# Patient Record
Sex: Male | Born: 1965
Health system: Southern US, Community
[De-identification: ages and names within clinical notes are randomized; demographics above are authoritative.]

## PROBLEM LIST (undated history)

## (undated) DIAGNOSIS — M779 Enthesopathy, unspecified: Secondary | ICD-10-CM

## (undated) DIAGNOSIS — E119 Type 2 diabetes mellitus without complications: Secondary | ICD-10-CM

## (undated) DIAGNOSIS — R609 Edema, unspecified: Secondary | ICD-10-CM

## (undated) DIAGNOSIS — M719 Bursopathy, unspecified: Secondary | ICD-10-CM

## (undated) DIAGNOSIS — F4310 Post-traumatic stress disorder, unspecified: Secondary | ICD-10-CM

## (undated) DIAGNOSIS — F32A Depression, unspecified: Secondary | ICD-10-CM

## (undated) DIAGNOSIS — L0390 Cellulitis, unspecified: Secondary | ICD-10-CM

## (undated) DIAGNOSIS — K589 Irritable bowel syndrome without diarrhea: Secondary | ICD-10-CM

## (undated) DIAGNOSIS — E7800 Pure hypercholesterolemia, unspecified: Secondary | ICD-10-CM

## (undated) DIAGNOSIS — F329 Major depressive disorder, single episode, unspecified: Secondary | ICD-10-CM

## (undated) DIAGNOSIS — G4730 Sleep apnea, unspecified: Secondary | ICD-10-CM

## (undated) DIAGNOSIS — F410 Panic disorder [episodic paroxysmal anxiety] without agoraphobia: Secondary | ICD-10-CM

## (undated) DIAGNOSIS — M109 Gout, unspecified: Secondary | ICD-10-CM

## (undated) DIAGNOSIS — I10 Essential (primary) hypertension: Secondary | ICD-10-CM

## (undated) DIAGNOSIS — R011 Cardiac murmur, unspecified: Secondary | ICD-10-CM

## (undated) HISTORY — DX: Major depressive disorder, single episode, unspecified: F32.9

## (undated) HISTORY — DX: Edema, unspecified: R60.9

## (undated) HISTORY — DX: Post-traumatic stress disorder, unspecified: F43.10

## (undated) HISTORY — DX: Irritable bowel syndrome, unspecified: K58.9

## (undated) HISTORY — DX: Panic disorder (episodic paroxysmal anxiety): F41.0

## (undated) HISTORY — DX: Sleep apnea, unspecified: G47.30

## (undated) HISTORY — DX: Enthesopathy, unspecified: M77.9

## (undated) HISTORY — PX: ABSCESS DRAINAGE: SHX1119

## (undated) HISTORY — DX: Depression, unspecified: F32.A

## (undated) HISTORY — DX: Cardiac murmur, unspecified: R01.1

---

## 2008-01-24 ENCOUNTER — Emergency Department (HOSPITAL_COMMUNITY): Admission: EM | Admit: 2008-01-24 | Discharge: 2008-01-24 | Payer: Self-pay | Admitting: Emergency Medicine

## 2008-02-22 ENCOUNTER — Emergency Department (HOSPITAL_COMMUNITY): Admission: EM | Admit: 2008-02-22 | Discharge: 2008-02-22 | Payer: Self-pay | Admitting: Emergency Medicine

## 2008-03-06 ENCOUNTER — Encounter: Admission: RE | Admit: 2008-03-06 | Discharge: 2008-03-06 | Payer: Self-pay | Admitting: Chiropractic Medicine

## 2008-08-08 ENCOUNTER — Ambulatory Visit (HOSPITAL_BASED_OUTPATIENT_CLINIC_OR_DEPARTMENT_OTHER): Admission: RE | Admit: 2008-08-08 | Discharge: 2008-08-08 | Payer: Self-pay | Admitting: Internal Medicine

## 2008-08-16 ENCOUNTER — Ambulatory Visit: Payer: Self-pay | Admitting: Internal Medicine

## 2010-06-01 NOTE — Procedures (Signed)
NAMEELFORD, EVILSIZER            ACCOUNT NO.:  [id number]   MEDICAL RECORD NO.:  [id number]          PATIENT TYPE:  OUT   LOCATION:  SLEEP CENTER                 FACILITY:  [facility]   PHYSICIAN:  Clinton D. Maple Hudson, MD, FCCP, FACPDATE OF BIRTH:  March 02, 1966   DATE OF STUDY:  08/08/2008                            NOCTURNAL POLYSOMNOGRAM   REFERRING PHYSICIAN:  Fleet Contras, M.D.   INDICATION FOR STUDY:  Hypersomnia with sleep apnea.   EPWORTH SLEEPINESS SCORE:  Epworth sleepiness score 10/24, BMI 53.9.  Weight 365 pounds, height 69 inches.  Neck 19.5 inches.   MEDICATIONS:  Home medications charted and reviewed.   SLEEP ARCHITECTURE:  Split study protocol.  During the diagnostic phase,  total sleep time 111.5 minutes with sleep efficiency 53%.  Stage I was  17.5%, stage II 82.5%, stages III and REM were absent.  Sleep latency  24.5 minutes.  Awake after sleep onset 74.5 minutes.  Arousal index  115.7 indicating increased EEG arousal.  No bedtime medication was  taken.   RESPIRATORY DATA:  Split study protocol.  Apnea/hypopnea index (AHI)  120.5 per hour.  A total of 224 events were scored including 212  obstructive apneas and 12 hypopneas.  Events were nonpositional.  CPAP  was then titrated to 19 CWP, AHI 0 per hour.  He wore a large ResMed  Quattro full face mask with heated humidifier.   OXYGEN DATA:  Loud snoring before CPAP with oxygen desaturation to a  nadir of 49%.  After CPAP control, mean oxygen saturation held at 94.1%  on room air.   CARDIAC DATA:  Normal sinus rhythm.   MOVEMENT-PARASOMNIA:  During the titration phase, a total of 24 limb  jerks were counted but none of these was associated with arousal or  awakening.  Bathroom x2.   IMPRESSIONS-RECOMMENDATIONS:  1. Severe obstructive sleep apnea/hypopnea syndrome, AHI 120.5 per      hour with nonpositional events.  Loud snoring with oxygen      desaturation to a nadir of 49% on room air.  2. Successful CPAP  titration to 19 CWP, AHI 0 per hour.  He wore a      large ResMed Quattro full face mask with heated humidifier.  Mean      oxygen saturation held at 94.1% with CPAP on room air.      Clinton D. Maple Hudson, MD, [facility], FACP  Diplomate, Biomedical engineer of Sleep Medicine  Electronically Signed     CDY/MEDQ  D:  08/16/2008 09:57:22  T:  08/16/2008 12:48:09  Job:  [id number]

## 2012-01-31 ENCOUNTER — Emergency Department (HOSPITAL_COMMUNITY): Payer: Self-pay

## 2012-01-31 ENCOUNTER — Emergency Department (HOSPITAL_COMMUNITY)
Admission: EM | Admit: 2012-01-31 | Discharge: 2012-01-31 | Disposition: A | Discharge status: Home / Self Care | Payer: Self-pay | Attending: Emergency Medicine | Admitting: Emergency Medicine

## 2012-01-31 ENCOUNTER — Encounter (HOSPITAL_COMMUNITY): Payer: Self-pay | Admitting: *Deleted

## 2012-01-31 DIAGNOSIS — I10 Essential (primary) hypertension: Secondary | ICD-10-CM | POA: Insufficient documentation

## 2012-01-31 DIAGNOSIS — Y939 Activity, unspecified: Secondary | ICD-10-CM | POA: Insufficient documentation

## 2012-01-31 DIAGNOSIS — Z8739 Personal history of other diseases of the musculoskeletal system and connective tissue: Secondary | ICD-10-CM | POA: Insufficient documentation

## 2012-01-31 DIAGNOSIS — S8000XA Contusion of unspecified knee, initial encounter: Secondary | ICD-10-CM | POA: Insufficient documentation

## 2012-01-31 DIAGNOSIS — E119 Type 2 diabetes mellitus without complications: Secondary | ICD-10-CM | POA: Insufficient documentation

## 2012-01-31 DIAGNOSIS — Y929 Unspecified place or not applicable: Secondary | ICD-10-CM | POA: Insufficient documentation

## 2012-01-31 DIAGNOSIS — Z862 Personal history of diseases of the blood and blood-forming organs and certain disorders involving the immune mechanism: Secondary | ICD-10-CM | POA: Insufficient documentation

## 2012-01-31 DIAGNOSIS — E7800 Pure hypercholesterolemia, unspecified: Secondary | ICD-10-CM | POA: Insufficient documentation

## 2012-01-31 DIAGNOSIS — R630 Anorexia: Secondary | ICD-10-CM | POA: Insufficient documentation

## 2012-01-31 DIAGNOSIS — X58XXXA Exposure to other specified factors, initial encounter: Secondary | ICD-10-CM | POA: Insufficient documentation

## 2012-01-31 DIAGNOSIS — Z8639 Personal history of other endocrine, nutritional and metabolic disease: Secondary | ICD-10-CM | POA: Insufficient documentation

## 2012-01-31 HISTORY — DX: Type 2 diabetes mellitus without complications: E11.9

## 2012-01-31 HISTORY — DX: Essential (primary) hypertension: I10

## 2012-01-31 HISTORY — DX: Gout, unspecified: M10.9

## 2012-01-31 HISTORY — DX: Pure hypercholesterolemia, unspecified: E78.00

## 2012-01-31 HISTORY — DX: Bursopathy, unspecified: M71.9

## 2012-01-31 MED ORDER — ONDANSETRON HCL 4 MG PO TABS
4.0000 mg | ORAL_TABLET | Freq: Once | ORAL | Status: AC
  Administered 2012-01-31: 4 mg via ORAL
  Filled 2012-01-31: qty 1

## 2012-01-31 MED ORDER — HYDROCODONE-ACETAMINOPHEN 5-325 MG PO TABS: 1.0000 | ORAL_TABLET | ORAL | Status: DC | PRN

## 2012-01-31 MED ORDER — MORPHINE SULFATE 4 MG/ML IJ SOLN
8.0000 mg | Freq: Once | INTRAMUSCULAR | Status: AC
  Administered 2012-01-31: 8 mg via INTRAMUSCULAR
  Filled 2012-01-31: qty 2

## 2012-01-31 NOTE — ED Notes (Signed)
Pain lt knee for 1 week, hx of gout,  Slid foot into sofa and thinks he hurt his knee then.

## 2012-01-31 NOTE — ED Provider Notes (Signed)
Medical screening examination/treatment/procedure(s) were performed by non-physician practitioner and as supervising physician I was immediately available for consultation/collaboration.   Tahliyah Anagnos L Mcadoo Muzquiz, MD 01/31/12 2046 

## 2012-01-31 NOTE — ED Provider Notes (Signed)
History     CSN: [id number]  Arrival date & time 01/31/12  1715   First MD Initiated Contact with Patient 01/31/12 1919      Chief Complaint  Patient presents with  . Knee Pain    (Consider location/radiation/quality/duration/timing/severity/associated sxs/prior treatment) Patient is a 47 y.o. male presenting with knee pain. The history is provided by the patient.  Knee Pain This is a new problem. The current episode started in the past 7 days. The problem occurs constantly. The problem has been gradually worsening. Associated symptoms include anorexia, arthralgias and joint swelling. Pertinent negatives include no abdominal pain, chest pain, coughing or neck pain. The symptoms are aggravated by standing and walking. He has tried acetaminophen and NSAIDs for the symptoms. The treatment provided no relief.    Past Medical History  Diagnosis Date  . Diabetes mellitus without complication   . Hypertension   . Hypercholesteremia   . Gout   . Bursitis     Past Surgical History  Procedure Date  . Abscess drainage     History reviewed. No pertinent family history.  History  Substance Use Topics  . Smoking status: Never Smoker   . Smokeless tobacco: Not on file  . Alcohol Use: No      Review of Systems  Constitutional: Negative for activity change.       All ROS Neg except as noted in HPI  HENT: Negative for nosebleeds and neck pain.   Eyes: Negative for photophobia and discharge.  Respiratory: Negative for cough, shortness of breath and wheezing.   Cardiovascular: Negative for chest pain and palpitations.  Gastrointestinal: Positive for anorexia. Negative for abdominal pain and blood in stool.  Genitourinary: Negative for dysuria, frequency and hematuria.  Musculoskeletal: Positive for joint swelling and arthralgias. Negative for back pain.  Skin: Negative.   Neurological: Negative for dizziness, seizures and speech difficulty.  Psychiatric/Behavioral: Negative for  hallucinations and confusion.    Allergies  Penicillins  Home Medications  No current outpatient prescriptions on file.  BP 129/64  Pulse 81  Temp 97.7 F (36.5 C) (Oral)  Resp 18  SpO2 97%  Physical Exam  Nursing note and vitals reviewed. Constitutional: He is oriented to person, place, and time. He appears well-developed and well-nourished.  Non-toxic appearance.  HENT:  Head: Normocephalic.  Right Ear: Tympanic membrane and external ear normal.  Left Ear: Tympanic membrane and external ear normal.  Eyes: EOM and lids are normal. Pupils are equal, round, and reactive to light.  Neck: Normal range of motion. Neck supple. Carotid bruit is not present.  Cardiovascular: Normal rate, regular rhythm, normal heart sounds, intact distal pulses and normal pulses.   Pulmonary/Chest: Breath sounds normal. No respiratory distress.  Abdominal: Soft. Bowel sounds are normal. There is no tenderness. There is no guarding.  Musculoskeletal: Normal range of motion.       There is full range of motion of the left hip. There is no deformity of the left quadricep area. There is mild crepitus present. The patella is in the midline. There is pain with extension and flexion of the left knee. There is no posterior mass appreciated. Distal pulses are symmetrical. Sensory is intact. Achilles tendon is intact.  Lymphadenopathy:       Head (right side): No submandibular adenopathy present.       Head (left side): No submandibular adenopathy present.    He has no cervical adenopathy.  Neurological: He is alert and oriented to person, place, and time. He  has normal strength. No cranial nerve deficit or sensory deficit.  Skin: Skin is warm and dry.  Psychiatric: He has a normal mood and affect. His speech is normal.    ED Course  Procedures (including critical care time)  Labs Reviewed - No data to display Dg Knee Complete 4 Views Left  01/31/2012  *RADIOLOGY REPORT*  Clinical Data: Pain post blunt  trauma.  LEFT KNEE - COMPLETE 4+ VIEW  Comparison: 03/06/2008  Findings: No effusion. Negative for fracture, dislocation, or other acute abnormality.  Normal alignment and mineralization. No significant degenerative change.  Regional soft tissues unremarkable.  IMPRESSION:  Negative   Original Report Authenticated By: D. Andria Rhein, MD      No diagnosis found.    MDM  I have reviewed nursing notes, vital signs, and all appropriate lab and imaging results for this patient. X-ray of the left knee is negative for fracture or dislocation. There is mild crepitus present with flexion and extension. There is mild effusion noted. The plan is for the patient to be seen by orthopedics. Prescription for Norco one every 4 hours as needed for pain given to the patient. Patient has a history of diabetes mellitus as well as gout, and has been advised to see his physicians at the health department for additional testing and evaluation of these issues.       Kathie Dike, [place] 01/31/12 1940

## 2014-12-25 ENCOUNTER — Encounter (HOSPITAL_COMMUNITY): Payer: Self-pay | Admitting: *Deleted

## 2014-12-25 ENCOUNTER — Emergency Department (HOSPITAL_COMMUNITY): Payer: Self-pay

## 2014-12-25 ENCOUNTER — Emergency Department (HOSPITAL_COMMUNITY)
Admission: EM | Admit: 2014-12-25 | Discharge: 2014-12-25 | Disposition: A | Discharge status: Home / Self Care | Payer: Self-pay | Attending: Emergency Medicine | Admitting: Emergency Medicine

## 2014-12-25 DIAGNOSIS — Z8781 Personal history of (healed) traumatic fracture: Secondary | ICD-10-CM | POA: Insufficient documentation

## 2014-12-25 DIAGNOSIS — I10 Essential (primary) hypertension: Secondary | ICD-10-CM

## 2014-12-25 DIAGNOSIS — Z880 Allergy status to penicillin: Secondary | ICD-10-CM | POA: Insufficient documentation

## 2014-12-25 DIAGNOSIS — E119 Type 2 diabetes mellitus without complications: Secondary | ICD-10-CM

## 2014-12-25 DIAGNOSIS — Z7984 Long term (current) use of oral hypoglycemic drugs: Secondary | ICD-10-CM | POA: Insufficient documentation

## 2014-12-25 DIAGNOSIS — Z79899 Other long term (current) drug therapy: Secondary | ICD-10-CM | POA: Insufficient documentation

## 2014-12-25 DIAGNOSIS — Z792 Long term (current) use of antibiotics: Secondary | ICD-10-CM | POA: Insufficient documentation

## 2014-12-25 DIAGNOSIS — L03116 Cellulitis of left lower limb: Secondary | ICD-10-CM | POA: Insufficient documentation

## 2014-12-25 DIAGNOSIS — L0390 Cellulitis, unspecified: Secondary | ICD-10-CM

## 2014-12-25 DIAGNOSIS — M109 Gout, unspecified: Secondary | ICD-10-CM | POA: Insufficient documentation

## 2014-12-25 LAB — COMPREHENSIVE METABOLIC PANEL
ALT: 28 U/L (ref 17–63)
AST: 29 U/L (ref 15–41)
Albumin: 4 g/dL (ref 3.5–5.0)
Alkaline Phosphatase: 60 U/L (ref 38–126)
Anion gap: 10 (ref 5–15)
BILIRUBIN TOTAL: 1.8 mg/dL — AB (ref 0.3–1.2)
BUN: 11 mg/dL (ref 6–20)
CALCIUM: 8.9 mg/dL (ref 8.9–10.3)
CO2: 26 mmol/L (ref 22–32)
CREATININE: 0.74 mg/dL (ref 0.61–1.24)
Chloride: 98 mmol/L — ABNORMAL LOW (ref 101–111)
GFR calc Af Amer: >60 mL/min (ref >60)
GLUCOSE: 207 mg/dL — AB (ref 65–99)
POTASSIUM: 4.2 mmol/L (ref 3.5–5.1)
Sodium: 134 mmol/L — ABNORMAL LOW (ref 135–145)
TOTAL PROTEIN: 7.9 g/dL (ref 6.5–8.1)

## 2014-12-25 LAB — CBC WITH DIFFERENTIAL/PLATELET
Basophils Absolute: 0 10*3/uL (ref 0.0–0.1)
Basophils Relative: 0 %
Eosinophils Absolute: 0.2 10*3/uL (ref 0.0–0.7)
Eosinophils Relative: 1 %
HEMATOCRIT: 42.7 % (ref 39.0–52.0)
Hemoglobin: 14.9 g/dL (ref 13.0–17.0)
LYMPHS PCT: 14 %
Lymphs Abs: 1.5 10*3/uL (ref 0.7–4.0)
MCH: 31 pg (ref 26.0–34.0)
MCHC: 34.9 g/dL (ref 30.0–36.0)
MCV: 89 fL (ref 78.0–100.0)
MONO ABS: 0.8 10*3/uL (ref 0.1–1.0)
MONOS PCT: 8 %
NEUTROS ABS: 8.2 10*3/uL — AB (ref 1.7–7.7)
Neutrophils Relative %: 77 %
Platelets: 214 10*3/uL (ref 150–400)
RBC: 4.8 MIL/uL (ref 4.22–5.81)
RDW: 12.9 % (ref 11.5–15.5)
WBC: 10.8 10*3/uL — ABNORMAL HIGH (ref 4.0–10.5)

## 2014-12-25 MED ORDER — ACIDOPHILUS PROBIOTIC PO TABS: 1.0000 | ORAL_TABLET | Freq: Two times a day (BID) | ORAL | Status: DC

## 2014-12-25 MED ORDER — CLINDAMYCIN HCL 150 MG PO CAPS: 300.0000 mg | ORAL_CAPSULE | Freq: Three times a day (TID) | ORAL | Status: AC

## 2014-12-25 MED ORDER — VANCOMYCIN HCL IN DEXTROSE 1-5 GM/200ML-% IV SOLN
1000.0000 mg | Freq: Once | INTRAVENOUS | Status: AC
  Administered 2014-12-25: 1000 mg via INTRAVENOUS
  Filled 2014-12-25: qty 200

## 2014-12-25 NOTE — Consult Note (Addendum)
Patient Demographics  Nathan Velez, is a 49 y.o. male   MRN: [id number]   DOB - August 18, 1965  Admit Date - 12/25/2014    Outpatient Primary MD for the patient is No primary care provider on file.  Consult requested in the Hospital by Bethann Berkshire, MD, On 12/25/2014    Reason for consult : Celluliits   With History of -  Past Medical History  Diagnosis Date  . Diabetes mellitus without complication (HCC)   . Hypertension   . Hypercholesteremia   . Gout   . Bursitis       Past Surgical History  Procedure Laterality Date  . Abscess drainage      in for   Chief Complaint  Patient presents with  . Cellulitis     HPI  Nathan Velez  is a 49 y.o. male, with PMH of DM, obesity, gout and hypertension, presents with left lower extremity cellulitis, patient reports started to have erythema, tenderness and swelling over last 24 hours, denies any injury, insect bite, went to health department, told by the triage nurse to come to ED, NAD patient had venous Doppler of left lower extremity with no evidence of DVT, afebrile, denies any fever or chills, no leukocytosis, no discharge, but this requested to admit for possible need for admission IV antibiotics,  .   Review of Systems    In addition to the HPI above, No Fever-chills, No Headache, No changes with Vision or hearing, No problems swallowing food or Liquids, No Chest pain, Cough or Shortness of Breath, No Abdominal pain, No Nausea or Vommitting, Bowel movements are regular, No Blood in stool or Urine, No dysuria, Lower ext erythema, swelling, over last 24 hours, No new joints pains-aches,  No new weakness, tingling, numbness in any extremity, No recent weight gain or loss, No polyuria, polydypsia or polyphagia, No significant Mental Stressors.  A full 10 point Review of Systems was done, except as stated above, all other  Review of Systems were negative.   Social History Social History  Substance Use Topics  . Smoking status: Never Smoker   . Smokeless tobacco: Not on file  . Alcohol Use: No     Family History Significant for  Prior to Admission medications   Medication Sig Start Date End Date Taking? Authorizing Provider  allopurinol (ZYLOPRIM) 300 MG tablet Take 300 mg by mouth daily.   Yes Historical Provider, MD  furosemide (LASIX) 40 MG tablet Take 40 mg by mouth daily as needed for fluid or edema.   Yes Historical Provider, MD  glyBURIDE (DIABETA) 5 MG tablet Take 10 mg by mouth 2 (two) times daily with a meal.   Yes Historical Provider, MD  lisinopril (PRINIVIL,ZESTRIL) 5 MG tablet Take 5 mg by mouth daily.   Yes Historical Provider, MD  metFORMIN (GLUCOPHAGE) 1000 MG tablet Take 1,000 mg by mouth 2 (two) times daily with a meal.   Yes Historical Provider, MD  metoprolol tartrate (LOPRESSOR) 25 MG tablet Take 25 mg by  mouth daily as needed (palpatations).   Yes Historical Provider, MD  potassium chloride SA (K-DUR,KLOR-CON) 20 MEQ tablet Take 20 mEq by mouth daily as needed (only take with furosemide).    Yes Historical Provider, MD  HYDROcodone-acetaminophen (NORCO/VICODIN) 5-325 MG per tablet Take 1 tablet by mouth every 4 (four) hours as needed for pain. Patient not taking: Reported on 12/25/2014 01/31/12   Ivery QualeHobson Bryant, PA-C    Anti-infectives    Start     Dose/Rate Route Frequency Ordered Stop   12/25/14 1545  vancomycin (VANCOCIN) IVPB 1000 mg/200 mL premix     1,000 mg 200 mL/hr over 60 Minutes Intravenous  Once 12/25/14 1541 12/25/14 1717      Scheduled Meds: Continuous Infusions: PRN Meds:.  Allergies  Allergen Reactions  . Penicillins Other (See Comments)    Physical Exam  Vitals  Blood pressure 114/76, pulse 77, temperature 97.6 F (36.4 C), temperature source Oral, resp. rate 18, height 5\' 9"  (1.753 m), weight 184.16 kg (406 lb), SpO2 100 %.   1. General morbidly  obese male lying in bed in NAD,   2. Normal affect and insight, Not Suicidal or Homicidal, Awake Alert, Oriented X 3.  3. No F.N deficits, ALL C.Nerves Intact, Strength 5/5 all 4 extremities, Sensation intact all 4 extremities, Plantars down going.  4. Ears and Eyes appear Normal, Conjunctivae clear, PERRLA. Moist Oral Mucosa.  5. Supple Neck, No JVD, No cervical lymphadenopathy appriciated, No Carotid Bruits.  6. Symmetrical Chest wall movement, Good air movement bilaterally, CTAB.  7. RRR, No Gallops, Rubs or Murmurs, No Parasternal Heave.  8. Positive Bowel Sounds, Abdomen Soft, No tenderness, No organomegaly appriciated,No rebound -guarding or rigidity.  9.  No Cyanosis, Normal Skin Turgor, left lower extremity cellulitis, no discharge or bullae.  10. Good muscle tone,  joints appear normal , no effusions, Normal ROM.  11. No Palpable Lymph Nodes in Neck or Axillae    Data Review  CBC  Recent Labs Lab 12/25/14 1539  WBC 10.8*  HGB 14.9  HCT 42.7  PLT 214  MCV 89.0  MCH 31.0  MCHC 34.9  RDW 12.9  LYMPHSABS 1.5  MONOABS 0.8  EOSABS 0.2  BASOSABS 0.0   ------------------------------------------------------------------------------------------------------------------  Chemistries   Recent Labs Lab 12/25/14 1539  NA 134*  K 4.2  CL 98*  CO2 26  GLUCOSE 207*  BUN 11  CREATININE 0.74  CALCIUM 8.9  AST 29  ALT 28  ALKPHOS 60  BILITOT 1.8*   ------------------------------------------------------------------------------------------------------------------ estimated creatinine clearance is 185.4 mL/min (by C-G formula based on Cr of 0.74). ------------------------------------------------------------------------------------------------------------------ No results for input(s): TSH, T4TOTAL, T3FREE, THYROIDAB in the last 72 hours.  Invalid input(s): FREET3   Coagulation profile No results for input(s): INR, PROTIME in the last 168  hours. ------------------------------------------------------------------------------------------------------------------- No results for input(s): DDIMER in the last 72 hours. -------------------------------------------------------------------------------------------------------------------  Cardiac Enzymes No results for input(s): CKMB, TROPONINI, MYOGLOBIN in the last 168 hours.  Invalid input(s): CK ------------------------------------------------------------------------------------------------------------------ Invalid input(s): POCBNP   ---------------------------------------------------------------------------------------------------------------  Urinalysis No results found for: COLORURINE, APPEARANCEUR, LABSPEC, PHURINE, GLUCOSEU, HGBUR, BILIRUBINUR, KETONESUR, PROTEINUR, UROBILINOGEN, NITRITE, LEUKOCYTESUR   Imaging results:   [place] Venous Img Lower Unilateral Left  12/25/2014  CLINICAL DATA:  Left lower extremity pain and swelling. EXAM: Left LOWER EXTREMITY VENOUS DOPPLER ULTRASOUND TECHNIQUE: Gray-scale sonography with graded compression, as well as color Doppler and duplex ultrasound were performed to evaluate the lower extremity deep venous systems from the level of the common femoral vein and including the  common femoral, femoral, profunda femoral, popliteal and calf veins including the posterior tibial, peroneal and gastrocnemius veins when visible. The superficial great saphenous vein was also interrogated. Spectral Doppler was utilized to evaluate flow at rest and with distal augmentation maneuvers in the common femoral, femoral and popliteal veins. COMPARISON:  None. FINDINGS: Contralateral Common Femoral Vein: Respiratory phasicity is normal and symmetric with the symptomatic side. No evidence of thrombus. Normal compressibility. Common Femoral Vein: No evidence of thrombus. Normal compressibility, respiratory phasicity and response to augmentation. Saphenofemoral Junction: No  evidence of thrombus. Normal compressibility and flow on color Doppler imaging. Profunda Femoral Vein: No evidence of thrombus. Normal compressibility and flow on color Doppler imaging. Femoral Vein: No evidence of thrombus. Normal compressibility, respiratory phasicity and response to augmentation. Popliteal Vein: No evidence of thrombus. Normal compressibility, respiratory phasicity and response to augmentation. Calf Veins: No evidence of thrombus. Normal compressibility and flow on color Doppler imaging. Superficial Great Saphenous Vein: No evidence of thrombus. Normal compressibility and flow on color Doppler imaging. Venous Reflux:  None. Other Findings:  None. IMPRESSION: No evidence of deep venous thrombosis seen in left lower extremity. Electronically Signed   By: Lupita Raider, M.D.   On: 12/25/2014 16:56        Assessment & Plan  Active Problems:   Cellulitis   HTN (hypertension)   DM type 2 (diabetes mellitus, type 2) (HCC)    Cellulitis - Patient with left lower extremity cellulitis, no evidence of sepsis, no tachycardia, no tachypnea, no fever, no leukocytosis, no discharge, did not fail po antibiotics, patient wishes to go home on oral antibiotic which I think it would be appropriate, can be discharged on clindamycin 300 mg oral every 6 hour total of 7 days, his clindamcyin was called to [facility],he reports he can afford it , and will pick it up on his way home,  Patient was instructed to come back to ED in 1-2 days if no improvement of cellulitis( edges were marked), or if he developed fever or chills,as well instructed to take oral probiotics, and to keep leg elevated.  Diabetes mellitus - Continue with home medication  Hypertension - Continue with the medication     Family Communication: Plan discussed with patient and mother    [facility], [place] M.D on 12/25/2014 at 7:04 PM  Between 7am to 7pm - Pager - [phone]  After 7pm go to [email or web address] -  password [email or web address]   Thank you for the consult, we will follow the patient with you in the Hospital.   [facility]   Office  [phone]

## 2014-12-25 NOTE — ED Notes (Signed)
Swelling and redness to left lower leg first noticed yesterday. Was told by triage nurse at health dept to come to the ED. Pt states fever and chills earlier this morning.

## 2014-12-25 NOTE — ED Provider Notes (Signed)
CSN: [id number]     Arrival date & time 12/25/14  1519 History   First MD Initiated Contact with Patient 12/25/14 1529     Chief Complaint  Patient presents with  . Cellulitis     (Consider location/radiation/quality/duration/timing/severity/associated sxs/prior Treatment) Patient is a 49 y.o. male presenting with rash. The history is provided by the patient (The patient complains of a rash to his left lower leg.. Which started yesterday).  Rash Location: Left lower leg. Quality: not blistering   Severity:  Moderate Onset quality:  Sudden Timing:  Constant Progression:  Worsening Chronicity:  New Context: not animal contact   Associated symptoms: no abdominal pain, no diarrhea, no fatigue and no headaches     Past Medical History  Diagnosis Date  . Diabetes mellitus without complication (HCC)   . Hypertension   . Hypercholesteremia   . Gout   . Bursitis    Past Surgical History  Procedure Laterality Date  . Abscess drainage     No family history on file. Social History  Substance Use Topics  . Smoking status: Never Smoker   . Smokeless tobacco: None  . Alcohol Use: No    Review of Systems  Constitutional: Negative for appetite change and fatigue.  HENT: Negative for congestion, ear discharge and sinus pressure.   Eyes: Negative for discharge.  Respiratory: Negative for cough.   Cardiovascular: Negative for chest pain.  Gastrointestinal: Negative for abdominal pain and diarrhea.  Genitourinary: Negative for frequency and hematuria.  Musculoskeletal: Negative for back pain.  Skin: Positive for rash.  Neurological: Negative for seizures and headaches.  Psychiatric/Behavioral: Negative for hallucinations.      Allergies  Penicillins  Home Medications   Prior to Admission medications   Medication Sig Start Date End Date Taking? Authorizing Provider  allopurinol (ZYLOPRIM) 300 MG tablet Take 300 mg by mouth daily.   Yes Historical Provider, MD  furosemide  (LASIX) 40 MG tablet Take 40 mg by mouth daily as needed for fluid or edema.   Yes Historical Provider, MD  glyBURIDE (DIABETA) 5 MG tablet Take 10 mg by mouth 2 (two) times daily with a meal.   Yes Historical Provider, MD  lisinopril (PRINIVIL,ZESTRIL) 5 MG tablet Take 5 mg by mouth daily.   Yes Historical Provider, MD  metFORMIN (GLUCOPHAGE) 1000 MG tablet Take 1,000 mg by mouth 2 (two) times daily with a meal.   Yes Historical Provider, MD  metoprolol tartrate (LOPRESSOR) 25 MG tablet Take 25 mg by mouth daily as needed (palpatations).   Yes Historical Provider, MD  potassium chloride SA (K-DUR,KLOR-CON) 20 MEQ tablet Take 20 mEq by mouth daily as needed (only take with furosemide).    Yes Historical Provider, MD  clindamycin (CLEOCIN) 150 MG capsule Take 2 capsules (300 mg total) by mouth 3 (three) times daily. 12/25/14 12/31/14  Leana Roe Elgergawy, MD  HYDROcodone-acetaminophen (NORCO/VICODIN) 5-325 MG per tablet Take 1 tablet by mouth every 4 (four) hours as needed for pain. Patient not taking: Reported on 12/25/2014 01/31/12   Ivery Quale, PA-C  Lactobacillus (ACIDOPHILUS PROBIOTIC) TABS Take 1 tablet by mouth 2 (two) times daily. 12/25/14   Leana Roe Elgergawy, MD   BP 114/76 mmHg  Pulse 77  Temp(Src) 97.6 F (36.4 C) (Oral)  Resp 18  Ht  (1.753 m)  Wt 406 lb (184.16 kg)  BMI 59.93 kg/m2  SpO2 100% Physical Exam  Constitutional: He is oriented to person, place, and time. He appears well-developed.  HENT:  Head: Normocephalic.  Eyes: Conjunctivae and EOM are normal. No scleral icterus.  Neck: Neck supple. No thyromegaly present.  Cardiovascular: Normal rate and regular rhythm.  Exam reveals no gallop and no friction rub.   No murmur heard. Pulmonary/Chest: No stridor. He has no wheezes. He has no rales. He exhibits no tenderness.  Abdominal: He exhibits no distension. There is no tenderness. There is no rebound.  Musculoskeletal: Normal range of motion. He exhibits no edema.   Patient has a rash to his foot and lower leg up to his knee on the left side. Consistent with cellulitis  Lymphadenopathy:    He has no cervical adenopathy.  Neurological: He is oriented to person, place, and time. He exhibits normal muscle tone. Coordination normal.  Skin: Rash noted. No erythema.  Psychiatric: He has a normal mood and affect. His behavior is normal.    ED Course  Procedures (including critical care time) Labs Review Labs Reviewed  CBC WITH DIFFERENTIAL/PLATELET - Abnormal; Notable for the following:    WBC 10.8 (*)    Neutro Abs 8.2 (*)    All other components within normal limits  COMPREHENSIVE METABOLIC PANEL - Abnormal; Notable for the following:    Sodium 134 (*)    Chloride 98 (*)    Glucose, Bld 207 (*)    Total Bilirubin 1.8 (*)    All other components within normal limits    Imaging Review [place] Venous Img Lower Unilateral Left  12/25/2014  CLINICAL DATA:  Left lower extremity pain and swelling. EXAM: Left LOWER EXTREMITY VENOUS DOPPLER ULTRASOUND TECHNIQUE: Gray-scale sonography with graded compression, as well as color Doppler and duplex ultrasound were performed to evaluate the lower extremity deep venous systems from the level of the common femoral vein and including the common femoral, femoral, profunda femoral, popliteal and calf veins including the posterior tibial, peroneal and gastrocnemius veins when visible. The superficial great saphenous vein was also interrogated. Spectral Doppler was utilized to evaluate flow at rest and with distal augmentation maneuvers in the common femoral, femoral and popliteal veins. COMPARISON:  None. FINDINGS: Contralateral Common Femoral Vein: Respiratory phasicity is normal and symmetric with the symptomatic side. No evidence of thrombus. Normal compressibility. Common Femoral Vein: No evidence of thrombus. Normal compressibility, respiratory phasicity and response to augmentation. Saphenofemoral Junction: No evidence of  thrombus. Normal compressibility and flow on color Doppler imaging. Profunda Femoral Vein: No evidence of thrombus. Normal compressibility and flow on color Doppler imaging. Femoral Vein: No evidence of thrombus. Normal compressibility, respiratory phasicity and response to augmentation. Popliteal Vein: No evidence of thrombus. Normal compressibility, respiratory phasicity and response to augmentation. Calf Veins: No evidence of thrombus. Normal compressibility and flow on color Doppler imaging. Superficial Great Saphenous Vein: No evidence of thrombus. Normal compressibility and flow on color Doppler imaging. Venous Reflux:  None. Other Findings:  None. IMPRESSION: No evidence of deep venous thrombosis seen in left lower extremity. Electronically Signed   By: Lupita RaiderJames  Green Jr, M.D.   On: 12/25/2014 16:56   I have personally reviewed and evaluated these images and lab results as part of my medical decision-making.   EKG Interpretation None      MDM   Final diagnoses:  Cellulitis of left lower extremity    Patient had Doppler study that was normal on his left leg. Labs and physical exam consistent with cellulitis. Patient given vancomycin. Internal medicine consult with and felt the patient could be treated as an outpatient so he is sent home on Cleocin. He will  return if problems but will follow-up with his family doctor next week  Bethann Berkshire, MD 12/25/14 1925

## 2014-12-25 NOTE — Discharge Instructions (Signed)
Follow up with your md next week.  Take the antibiotics as prescribed and return here if getting worse

## 2015-04-19 ENCOUNTER — Emergency Department (HOSPITAL_COMMUNITY)
Admission: EM | Admit: 2015-04-19 | Discharge: 2015-04-19 | Disposition: A | Discharge status: Home / Self Care | Payer: Self-pay | Attending: Emergency Medicine | Admitting: Emergency Medicine

## 2015-04-19 ENCOUNTER — Encounter (HOSPITAL_COMMUNITY): Payer: Self-pay

## 2015-04-19 DIAGNOSIS — E119 Type 2 diabetes mellitus without complications: Secondary | ICD-10-CM | POA: Insufficient documentation

## 2015-04-19 DIAGNOSIS — L03116 Cellulitis of left lower limb: Secondary | ICD-10-CM | POA: Insufficient documentation

## 2015-04-19 DIAGNOSIS — Z7984 Long term (current) use of oral hypoglycemic drugs: Secondary | ICD-10-CM | POA: Insufficient documentation

## 2015-04-19 DIAGNOSIS — I10 Essential (primary) hypertension: Secondary | ICD-10-CM | POA: Insufficient documentation

## 2015-04-19 DIAGNOSIS — E782 Mixed hyperlipidemia: Secondary | ICD-10-CM | POA: Insufficient documentation

## 2015-04-19 DIAGNOSIS — Z79899 Other long term (current) drug therapy: Secondary | ICD-10-CM | POA: Insufficient documentation

## 2015-04-19 LAB — CBC WITH DIFFERENTIAL/PLATELET
Basophils Absolute: 0 10*3/uL (ref 0.0–0.1)
Basophils Relative: 0 %
Eosinophils Absolute: 0.1 10*3/uL (ref 0.0–0.7)
Eosinophils Relative: 1 %
HEMATOCRIT: 41.3 % (ref 39.0–52.0)
Hemoglobin: 14.5 g/dL (ref 13.0–17.0)
LYMPHS PCT: 21 %
Lymphs Abs: 2.2 10*3/uL (ref 0.7–4.0)
MCH: 30.7 pg (ref 26.0–34.0)
MCHC: 35.1 g/dL (ref 30.0–36.0)
MCV: 87.3 fL (ref 78.0–100.0)
MONO ABS: 0.8 10*3/uL (ref 0.1–1.0)
MONOS PCT: 8 %
NEUTROS ABS: 7.1 10*3/uL (ref 1.7–7.7)
Neutrophils Relative %: 70 %
Platelets: 211 10*3/uL (ref 150–400)
RBC: 4.73 MIL/uL (ref 4.22–5.81)
RDW: 13.1 % (ref 11.5–15.5)
WBC: 10.2 10*3/uL (ref 4.0–10.5)

## 2015-04-19 LAB — BASIC METABOLIC PANEL
ANION GAP: 9 (ref 5–15)
BUN: 22 mg/dL — ABNORMAL HIGH (ref 6–20)
CALCIUM: 8.7 mg/dL — AB (ref 8.9–10.3)
CO2: 26 mmol/L (ref 22–32)
Chloride: 99 mmol/L — ABNORMAL LOW (ref 101–111)
Creatinine, Ser: 0.86 mg/dL (ref 0.61–1.24)
GFR calc Af Amer: >60 mL/min (ref >60)
GFR calc non Af Amer: >60 mL/min (ref >60)
GLUCOSE: 220 mg/dL — AB (ref 65–99)
POTASSIUM: 4 mmol/L (ref 3.5–5.1)
Sodium: 134 mmol/L — ABNORMAL LOW (ref 135–145)

## 2015-04-19 MED ORDER — CLINDAMYCIN PHOSPHATE 600 MG/50ML IV SOLN
600.0000 mg | Freq: Once | INTRAVENOUS | Status: AC
  Administered 2015-04-19: 600 mg via INTRAVENOUS
  Filled 2015-04-19: qty 50

## 2015-04-19 MED ORDER — CLINDAMYCIN HCL 150 MG PO CAPS: 300.0000 mg | ORAL_CAPSULE | Freq: Three times a day (TID) | ORAL | Status: DC

## 2015-04-19 NOTE — ED Provider Notes (Signed)
CSN: [id number]     Arrival date & time 04/19/15  [id number] History  By signing my name below, I, Nathan Velez, attest that this documentation has been prepared under the direction and in the presence of Nathan CoreNathan Nathan Macconnell, MD. Electronically Signed: Ronney LionSuzanne Velez, ED Scribe. 04/19/2015. 11:11 AM.   Chief Complaint  Patient presents with  . Leg Pain   The history is provided by the patient. No language interpreter was used.    HPI Comments: Nathan Velez is a 50 y.o. male with a history of DM, gout, and obesity, who presents to the Emergency Department complaining of recurrent area of gradual-onset, constant, gradually improving redness and stinging pain on his left lower leg that he first noticed 3 days ago while walking his dogs. He states he has had similar episodes in the past and suspects it may be because his chihuahuas routinely scratch him on his left leg specifically. Patient states he had seen his PCP 3 days ago and was started on doxycycline, which he has taken consistently over the past 3 days. He notes associated fever, chills, and generalized weakness on the first 1-2 days of his symptoms, but these seemingly resolved after taking doxycycline. Patient states he outlined the rash on his leg yesterday, noting only minimal spreading of the erythema since. He states he came in to the ED today because he had applied coconut oil to his leg, which seemed to exacerbate/cause stinging pain over the area. Walking exacerbates his pain. Patient states he has been washing his leg with Dove soap in efforts to keep the area clean. He states he was successfully treated for a similar episode in the past with IV clindamycin.  Past Medical History  Diagnosis Date  . Diabetes mellitus without complication (HCC)   . Hypertension   . Hypercholesteremia   . Gout   . Bursitis    Past Surgical History  Procedure Laterality Date  . Abscess drainage     No family history on file. Social History  Substance Use  Topics  . Smoking status: Never Smoker   . Smokeless tobacco: None  . Alcohol Use: No    Review of Systems  Musculoskeletal: Positive for myalgias (left lower leg pain).  Skin: Positive for color change (redness).  All other systems reviewed and are negative.  Allergies  Penicillins  Home Medications   Prior to Admission medications   Medication Sig Start Date End Date Taking? Authorizing Provider  allopurinol (ZYLOPRIM) 300 MG tablet Take 300 mg by mouth daily.   Yes Historical Provider, MD  doxycycline (VIBRAMYCIN) 100 MG capsule Take 100 mg by mouth 2 (two) times daily.   Yes Historical Provider, MD  furosemide (LASIX) 40 MG tablet Take 40 mg by mouth daily as needed for fluid or edema.   Yes Historical Provider, MD  glyBURIDE (DIABETA) 5 MG tablet Take 10 mg by mouth 2 (two) times daily with a meal.   Yes Historical Provider, MD  lisinopril (PRINIVIL,ZESTRIL) 5 MG tablet Take 5 mg by mouth daily.   Yes Historical Provider, MD  metFORMIN (GLUCOPHAGE) 1000 MG tablet Take 1,000 mg by mouth 2 (two) times daily with a meal.   Yes Historical Provider, MD  metoprolol tartrate (LOPRESSOR) 25 MG tablet Take 25 mg by mouth daily as needed (palpatations).   Yes Historical Provider, MD  clindamycin (CLEOCIN) 150 MG capsule Take 2 capsules (300 mg total) by mouth 3 (three) times daily. 04/19/15   Nathan CoreNathan Burr Soffer, MD  Lactobacillus (ACIDOPHILUS PROBIOTIC) TABS  Take 1 tablet by mouth 2 (two) times daily. Patient not taking: Reported on 04/19/2015 12/25/14   Nathan Roe Elgergawy, MD   BP 102/71 mmHg  Pulse 72  Temp(Src) 97.8 F (36.6 C) (Oral)  Resp 20  Ht  (1.753 m)  Wt 400 lb (181.439 kg)  BMI 59.04 kg/m2  SpO2 96% Physical Exam  Constitutional: He is oriented to person, place, and time. He appears well-developed and well-nourished. No distress.  Obese.   HENT:  Head: Normocephalic and atraumatic.  Eyes: Conjunctivae and EOM are normal.  Neck: Neck supple. No tracheal deviation  present.  Cardiovascular: Normal rate and regular rhythm.   Pulmonary/Chest: Effort normal and breath sounds normal. No respiratory distress. He has no wheezes. He has no rales.  Abdominal: Soft. There is no tenderness.  Musculoskeletal: Normal range of motion. He exhibits edema.  Edema, erythema, and induration on LLE.   Neurological: He is alert and oriented to person, place, and time.  Skin: Skin is warm and dry.  Psychiatric: He has a normal mood and affect. His behavior is normal.  Nursing note and vitals reviewed.      ED Course  Procedures (including critical care time)  DIAGNOSTIC STUDIES: Oxygen Saturation is 94% on RA, adequate by my interpretation.    COORDINATION OF CARE: 10:41 AM - Discussed treatment plan with pt at bedside which includes blood tests. If blood test results are unremarkable, will switch to clindamycin. Pt verbalized understanding and agreed to plan.   Labs Review Labs Reviewed  BASIC METABOLIC PANEL - Abnormal; Notable for the following:    Sodium 134 (*)    Chloride 99 (*)    Glucose, Bld 220 (*)    BUN 22 (*)    Calcium 8.7 (*)    All other components within normal limits  CBC WITH DIFFERENTIAL/PLATELET    Imaging Review No results found. I have personally reviewed and evaluated these images and lab results as part of my medical decision-making.   EKG Interpretation None      MDM   Final diagnoses:  Cellulitis of left lower extremity   Patient with cellulitis of lower extremity. Has been on doxycycline and states the fevers was stopped in the redness improved somewhat. Lab work overall reassuring. Will switch to clindamycin since it has appeared to slow down his improvement somewhat. Will have recheck if needed. Will discharge home. I personally performed the services described in this documentation, which was scribed in my presence. The recorded information has been reviewed and is accurate.        Nathan Core,  MD 04/19/15 [phone]

## 2015-04-19 NOTE — Discharge Instructions (Signed)

## 2015-04-19 NOTE — ED Notes (Signed)
Pt reports has history of cellulitis in leg.  Pt says redness in left lower leg started Thursday around 7pm.  Pt says he had some antibiotics left over from the last time he had cellulitis so he started taking it.  Pt not sure what the name of the medication is.

## 2015-04-19 NOTE — ED Notes (Signed)
Pt says the antibiotic he started taking was doxycycline.

## 2015-04-23 ENCOUNTER — Emergency Department (HOSPITAL_COMMUNITY)
Admission: EM | Admit: 2015-04-23 | Discharge: 2015-04-23 | Disposition: A | Discharge status: Home / Self Care | Payer: Self-pay | Attending: Emergency Medicine | Admitting: Emergency Medicine

## 2015-04-23 ENCOUNTER — Encounter (HOSPITAL_COMMUNITY): Payer: Self-pay | Admitting: Emergency Medicine

## 2015-04-23 DIAGNOSIS — Z79899 Other long term (current) drug therapy: Secondary | ICD-10-CM | POA: Insufficient documentation

## 2015-04-23 DIAGNOSIS — E119 Type 2 diabetes mellitus without complications: Secondary | ICD-10-CM | POA: Insufficient documentation

## 2015-04-23 DIAGNOSIS — Z7984 Long term (current) use of oral hypoglycemic drugs: Secondary | ICD-10-CM | POA: Insufficient documentation

## 2015-04-23 DIAGNOSIS — I10 Essential (primary) hypertension: Secondary | ICD-10-CM | POA: Insufficient documentation

## 2015-04-23 DIAGNOSIS — L03116 Cellulitis of left lower limb: Secondary | ICD-10-CM | POA: Insufficient documentation

## 2015-04-23 HISTORY — DX: Cellulitis, unspecified: L03.90

## 2015-04-23 NOTE — ED Notes (Signed)
Pt here for follow-up in regards to his cellulitis to LT foot. Pt seen and treated in ED last week. Pt has been taking clindamycin at home. States it is taking longer to resolve than usual. Moderate redness and edema noted to foot.

## 2015-04-23 NOTE — Discharge Instructions (Signed)
Continue to monitor your cellulitis, should continue improving over the next few days.  Make sure to finish ALL of your antibiotics. Monitor blood sugar at home, try to keep it controlled. May follow-up with the health dept. Return here for any new/worsening symptoms.

## 2015-04-23 NOTE — ED Provider Notes (Signed)
CSN: [id number]     Arrival date & time 04/23/15  4098 History   First MD Initiated Contact with Patient 04/23/15 1002     Chief Complaint  Patient presents with  . Cellulitis     (Consider location/radiation/quality/duration/timing/severity/associated sxs/prior Treatment) The history is provided by the patient and medical records.    50 y.o. M with hx of HTN, DM, HLP, gout, recurrent cellulitis, presenting to the ED for recheck of left lower leg/foot.  Patient states this initially began last week. He was initially taking some leftover doxycycline and was switched to clindamycin 4 days ago here in the ED. He states since this time cellulitis has significantly improved but he continues to have some mild redness and warmth to touch of his left foot.  He denies any fever or chills. No nausea, vomiting, or diarrhea. States he's been taking antibiotics as directed. His blood sugars at home have been around 180-190 range which is normal with him. He states he has follow-up appt with health dept next week.  Past Medical History  Diagnosis Date  . Diabetes mellitus without complication (HCC)   . Hypertension   . Hypercholesteremia   . Gout   . Bursitis   . Cellulitis    Past Surgical History  Procedure Laterality Date  . Abscess drainage     No family history on file. Social History  Substance Use Topics  . Smoking status: Never Smoker   . Smokeless tobacco: None  . Alcohol Use: No    Review of Systems  Skin: Positive for color change.  All other systems reviewed and are negative.     Allergies  Penicillins  Home Medications   Prior to Admission medications   Medication Sig Start Date End Date Taking? Authorizing Provider  allopurinol (ZYLOPRIM) 300 MG tablet Take 300 mg by mouth daily.   Yes Historical Provider, MD  clindamycin (CLEOCIN) 150 MG capsule Take 2 capsules (300 mg total) by mouth 3 (three) times daily. 04/19/15  Yes Benjiman Core, MD  doxycycline (VIBRAMYCIN)  100 MG capsule Take 100 mg by mouth 2 (two) times daily.   Yes Historical Provider, MD  furosemide (LASIX) 40 MG tablet Take 40 mg by mouth daily as needed for fluid or edema.   Yes Historical Provider, MD  glyBURIDE (DIABETA) 5 MG tablet Take 10 mg by mouth 2 (two) times daily with a meal.   Yes Historical Provider, MD  Lactobacillus (ACIDOPHILUS PROBIOTIC) TABS Take 1 tablet by mouth 2 (two) times daily. 12/25/14  Yes Leana Roe Elgergawy, MD  lisinopril (PRINIVIL,ZESTRIL) 5 MG tablet Take 5 mg by mouth daily.   Yes Historical Provider, MD  metFORMIN (GLUCOPHAGE) 1000 MG tablet Take 1,000 mg by mouth 2 (two) times daily with a meal.   Yes Historical Provider, MD  metoprolol tartrate (LOPRESSOR) 25 MG tablet Take 25 mg by mouth daily as needed (palpatations).   Yes Historical Provider, MD   BP 137/82 mmHg  Pulse 79  Temp(Src) 98 F (36.7 C) (Oral)  Resp 18  Wt 181.439 kg  SpO2 96%   Physical Exam  Constitutional: He is oriented to person, place, and time. He appears well-developed and well-nourished. No distress.  HENT:  Head: Normocephalic and atraumatic.  Mouth/Throat: Oropharynx is clear and moist.  Eyes: Conjunctivae and EOM are normal. Pupils are equal, round, and reactive to light.  Neck: Normal range of motion. Neck supple.  Cardiovascular: Normal rate, regular rhythm and normal heart sounds.   Pulmonary/Chest: Effort normal and  breath sounds normal. No respiratory distress. He has no wheezes.  Abdominal: Soft. Bowel sounds are normal.  Musculoskeletal: Normal range of motion. He exhibits no edema.  Improving cellulitis of left leg and foot; still with some mild erythema and warmth to touch to dorsal foot; no tissue crepitus or fluctuance; no open wounds or drainage; no calf asymmetry or palpable cords; DP pulses intact  Neurological: He is alert and oriented to person, place, and time.  Skin: Skin is warm and dry. He is not diaphoretic.  Psychiatric: He has a normal mood and  affect.  Nursing note and vitals reviewed.       ED Course  Procedures (including critical care time) Labs Review Labs Reviewed - No data to display  Imaging Review No results found. I have personally reviewed and evaluated these images and lab results as part of my medical decision-making.   EKG Interpretation None      MDM   Final diagnoses:  Cellulitis of left lower extremity   50 year old male here with cellulitis of left lower leg/foot. Seen in the ED 4 days ago and started on clindamycin. Cellulitis has significantly improved since this time after only 4 days of antibiotics.  Patient remains afebrile, nontoxic. His blood sugars have been reasonably controlled at home in the 180-190 range.  No tissue crepitus.  No open wounds.  Do not suspect osteomyelitis, necrotizing fascitis, or other complicating factor.  Suspect his cellulitis will continue to improve with completion of remainder of antibiotics.  He has follow-up appt with health dept next week.  Continue to keep sugars controlled at home.  Discussed plan with patient, he/she acknowledged understanding and agreed with plan of care.  Return precautions given for new or worsening symptoms.  Garlon HatchetLisa M Page Pucciarelli, PA-C 04/23/15 1142  Samuel JesterKathleen McManus, DO 04/27/15 [phone]

## 2015-05-08 ENCOUNTER — Encounter (HOSPITAL_COMMUNITY): Payer: Self-pay

## 2015-05-08 ENCOUNTER — Emergency Department (HOSPITAL_COMMUNITY)
Admission: EM | Admit: 2015-05-08 | Discharge: 2015-05-08 | Disposition: A | Discharge status: Home / Self Care | Payer: Self-pay | Attending: Emergency Medicine | Admitting: Emergency Medicine

## 2015-05-08 DIAGNOSIS — L03116 Cellulitis of left lower limb: Secondary | ICD-10-CM | POA: Insufficient documentation

## 2015-05-08 DIAGNOSIS — Z79899 Other long term (current) drug therapy: Secondary | ICD-10-CM | POA: Insufficient documentation

## 2015-05-08 DIAGNOSIS — E119 Type 2 diabetes mellitus without complications: Secondary | ICD-10-CM | POA: Insufficient documentation

## 2015-05-08 DIAGNOSIS — I10 Essential (primary) hypertension: Secondary | ICD-10-CM | POA: Insufficient documentation

## 2015-05-08 DIAGNOSIS — E7800 Pure hypercholesterolemia, unspecified: Secondary | ICD-10-CM | POA: Insufficient documentation

## 2015-05-08 LAB — BASIC METABOLIC PANEL
Anion gap: 10 (ref 5–15)
BUN: 14 mg/dL (ref 6–20)
CHLORIDE: 101 mmol/L (ref 101–111)
CO2: 25 mmol/L (ref 22–32)
CREATININE: 0.83 mg/dL (ref 0.61–1.24)
Calcium: 9 mg/dL (ref 8.9–10.3)
GFR calc non Af Amer: >60 mL/min (ref >60)
GLUCOSE: 130 mg/dL — AB (ref 65–99)
Potassium: 4 mmol/L (ref 3.5–5.1)
Sodium: 136 mmol/L (ref 135–145)

## 2015-05-08 LAB — CBC WITH DIFFERENTIAL/PLATELET
BASOS PCT: 0 %
Basophils Absolute: 0 10*3/uL (ref 0.0–0.1)
Eosinophils Absolute: 0.2 10*3/uL (ref 0.0–0.7)
Eosinophils Relative: 2 %
HEMATOCRIT: 40.7 % (ref 39.0–52.0)
HEMOGLOBIN: 13.8 g/dL (ref 13.0–17.0)
LYMPHS ABS: 1.7 10*3/uL (ref 0.7–4.0)
LYMPHS PCT: 13 %
MCH: 30.2 pg (ref 26.0–34.0)
MCHC: 33.9 g/dL (ref 30.0–36.0)
MCV: 89.1 fL (ref 78.0–100.0)
MONO ABS: 0.9 10*3/uL (ref 0.1–1.0)
MONOS PCT: 7 %
NEUTROS ABS: 9.8 10*3/uL — AB (ref 1.7–7.7)
NEUTROS PCT: 78 %
Platelets: 201 10*3/uL (ref 150–400)
RBC: 4.57 MIL/uL (ref 4.22–5.81)
RDW: 13.6 % (ref 11.5–15.5)
WBC: 12.5 10*3/uL — ABNORMAL HIGH (ref 4.0–10.5)

## 2015-05-08 LAB — CBG MONITORING, ED: Glucose-Capillary: 108 mg/dL — ABNORMAL HIGH (ref 65–99)

## 2015-05-08 MED ORDER — IBUPROFEN 800 MG PO TABS: 800.0000 mg | ORAL_TABLET | Freq: Three times a day (TID) | ORAL | Status: DC | PRN

## 2015-05-08 MED ORDER — CLINDAMYCIN PHOSPHATE 600 MG/50ML IV SOLN
600.0000 mg | Freq: Once | INTRAVENOUS | Status: AC
  Administered 2015-05-08: 600 mg via INTRAVENOUS
  Filled 2015-05-08: qty 50

## 2015-05-08 MED ORDER — CLINDAMYCIN HCL 300 MG PO CAPS: 300.0000 mg | ORAL_CAPSULE | Freq: Three times a day (TID) | ORAL | Status: DC

## 2015-05-08 MED ORDER — HYDROCODONE-ACETAMINOPHEN 5-325 MG PO TABS: 1.0000 | ORAL_TABLET | Freq: Four times a day (QID) | ORAL | Status: DC | PRN

## 2015-05-08 MED ORDER — KETOROLAC TROMETHAMINE 30 MG/ML IJ SOLN
30.0000 mg | Freq: Once | INTRAMUSCULAR | Status: AC
  Administered 2015-05-08: 30 mg via INTRAVENOUS
  Filled 2015-05-08: qty 1

## 2015-05-08 NOTE — ED Provider Notes (Signed)
TIME SEEN: 1:50 AM  CHIEF COMPLAINT: Left leg pain, redness and warmth  HPI: Pt is a 50 y.o. male with history of morbid obesity, hypertension, hyperlipidemia, diabetes, recurrent left lower extremity cellulitis who presents to the emergency department with cellulitis that started yesterday afternoon. Denies any fevers, chills, nausea, vomiting or diarrhea. Reports his blood sugars have been in the 150s to 160s. States he has never had a DVT before. States that clindamycin has helped him the most in the past. No injury to this leg.  ROS: See HPI Constitutional: no fever  Eyes: no drainage  ENT: no runny nose   Cardiovascular:  no chest pain  Resp: no SOB  GI: no vomiting GU: no dysuria Integumentary: no rash  Allergy: no hives  Musculoskeletal: no leg swelling  Neurological: no slurred speech ROS otherwise negative  PAST MEDICAL HISTORY/PAST SURGICAL HISTORY:  Past Medical History  Diagnosis Date  . Diabetes mellitus without complication (HCC)   . Hypertension   . Hypercholesteremia   . Gout   . Bursitis   . Cellulitis     MEDICATIONS:  Prior to Admission medications   Medication Sig Start Date End Date Taking? Authorizing Provider  allopurinol (ZYLOPRIM) 300 MG tablet Take 300 mg by mouth daily.    Historical Provider, MD  clindamycin (CLEOCIN) 150 MG capsule Take 2 capsules (300 mg total) by mouth 3 (three) times daily. 04/19/15   Benjiman CoreNathan Pickering, MD  doxycycline (VIBRAMYCIN) 100 MG capsule Take 100 mg by mouth 2 (two) times daily.    Historical Provider, MD  furosemide (LASIX) 40 MG tablet Take 40 mg by mouth daily as needed for fluid or edema.    Historical Provider, MD  glyBURIDE (DIABETA) 5 MG tablet Take 10 mg by mouth 2 (two) times daily with a meal.    Historical Provider, MD  Lactobacillus (ACIDOPHILUS PROBIOTIC) TABS Take 1 tablet by mouth 2 (two) times daily. 12/25/14   [name], MD  lisinopril (PRINIVIL,ZESTRIL) 5 MG tablet Take 5 mg by mouth daily.     Historical Provider, MD  metFORMIN (GLUCOPHAGE) 1000 MG tablet Take 1,000 mg by mouth 2 (two) times daily with a meal.    Historical Provider, MD  metoprolol tartrate (LOPRESSOR) 25 MG tablet Take 25 mg by mouth daily as needed (palpatations).    Historical Provider, MD    ALLERGIES:  Allergies  Allergen Reactions  . Penicillins Other (See Comments)    SOCIAL HISTORY:  Social History  Substance Use Topics  . Smoking status: Never Smoker   . Smokeless tobacco: Not on file  . Alcohol Use: No    FAMILY HISTORY: History reviewed. No pertinent family history.  EXAM: BP 118/64 mmHg  Pulse 80  Temp(Src) 98.2 F (36.8 C) (Oral)  Resp 17  Ht 5\' 9"  (1.753 m)  Wt 400 lb (181.439 kg)  BMI 59.04 kg/m2  SpO2 98% CONSTITUTIONAL: Alert and oriented and responds appropriately to questions. Well-appearing; well-nourished, afebrile, nontoxic appearing HEAD: Normocephalic EYES: Conjunctivae clear, PERRL ENT: normal nose; no rhinorrhea; moist mucous membranes NECK: Supple, no meningismus, no LAD  CARD: RRR; S1 and S2 appreciated; no murmurs, no clicks, no rubs, no gallops RESP: Normal chest excursion without splinting or tachypnea; breath sounds clear and equal bilaterally; no wheezes, no rhonchi, no rales, no hypoxia or respiratory distress, speaking full sentences ABD/GI: Normal bowel sounds; non-distended; soft, non-tender, no rebound, no guarding, no peritoneal signs BACK:  The back appears normal and is non-tender to palpation, there is no  CVA tenderness EXT: Patient has erythema, warmth in the left lower extremity from the proximal calf down to the foot. No ulcerated or open lesions. No fluctuance. No induration. No joint effusions. Compartments are all soft. 2+ DP pulses bilaterally. Mildly tender to palpation throughout this area of cellulitis on the left leg. Normal ROM in all joints; otherwise extremities are non-tender to palpation; no edema; normal capillary refill; no cyanosis, no  calf tenderness or swelling    SKIN: Normal color for age and race; warm; no rash NEURO: Moves all extremities equally, sensation to light touch intact diffusely, cranial nerves II through XII intact PSYCH: The patient's mood and manner are appropriate. Grooming and personal hygiene are appropriate.  MEDICAL DECISION MAKING: Patient here with recurrent cellulitis to the left lower extremity. He is nontoxic appearing. Reports controlled blood sugar. Will obtain labs, CBG today. We'll give IV clindamycin, Toradol. He reports he did drive himself to the emergency department. Denies any injury to the leg. We'll outline his area of cellulitis. He states he has had recurrently lettuce in this leg before that has done well with oral clindamycin.  ED PROGRESS: Patient reports feeling much better after IV Toradol. He is still hemodynamically stable. Labs do show mild leukocytosis. Blood glucose is 108. I do feel patient is safe to be discharged home. He is comfortable with this plan as well. Will discharge on clindamycin for the next 10 days as this has helped him significantly in the past. We'll discharge with ibuprofen, Vicodin the take as needed for pain. Discussed return precautions. His area of cellulitis has been outlined today.   At this time, I do not feel there is any life-threatening condition present. I have reviewed and discussed all results (EKG, imaging, lab, urine as appropriate), exam findings with patient. I have reviewed nursing notes and appropriate previous records.  I feel the patient is safe to be discharged home without further emergent workup. Discussed usual and customary return precautions. Patient and family (if present) verbalize understanding and are comfortable with this plan.  Patient will follow-up with their primary care provider. If they do not have a primary care provider, information for follow-up has been provided to them. All questions have been answered.      Nathan Maw  Ward, DO 05/08/15 [phone]

## 2015-05-08 NOTE — Discharge Instructions (Signed)

## 2015-05-08 NOTE — ED Notes (Signed)
Left Leg marked :  Outlined cellulitic edges

## 2015-05-08 NOTE — ED Notes (Signed)
Left leg pain, increased redness and warmth.  Hx of cellulitis in this same leg.

## 2015-05-09 ENCOUNTER — Emergency Department (HOSPITAL_COMMUNITY)
Admission: EM | Admit: 2015-05-09 | Discharge: 2015-05-09 | Disposition: A | Discharge status: Home / Self Care | Payer: Self-pay | Attending: Emergency Medicine | Admitting: Emergency Medicine

## 2015-05-09 ENCOUNTER — Observation Stay (HOSPITAL_COMMUNITY)
Admission: EM | Admit: 2015-05-09 | Discharge: 2015-05-09 | Disposition: A | Discharge status: Home / Self Care | Payer: Self-pay | Attending: Internal Medicine | Admitting: Internal Medicine

## 2015-05-09 ENCOUNTER — Encounter (HOSPITAL_COMMUNITY): Payer: Self-pay | Admitting: *Deleted

## 2015-05-09 DIAGNOSIS — Z791 Long term (current) use of non-steroidal anti-inflammatories (NSAID): Secondary | ICD-10-CM | POA: Insufficient documentation

## 2015-05-09 DIAGNOSIS — Z79899 Other long term (current) drug therapy: Secondary | ICD-10-CM | POA: Insufficient documentation

## 2015-05-09 DIAGNOSIS — L0390 Cellulitis, unspecified: Secondary | ICD-10-CM | POA: Diagnosis present

## 2015-05-09 DIAGNOSIS — Z7984 Long term (current) use of oral hypoglycemic drugs: Secondary | ICD-10-CM | POA: Insufficient documentation

## 2015-05-09 DIAGNOSIS — E119 Type 2 diabetes mellitus without complications: Secondary | ICD-10-CM | POA: Insufficient documentation

## 2015-05-09 DIAGNOSIS — L03032 Cellulitis of left toe: Secondary | ICD-10-CM

## 2015-05-09 DIAGNOSIS — L03039 Cellulitis of unspecified toe: Secondary | ICD-10-CM

## 2015-05-09 DIAGNOSIS — I10 Essential (primary) hypertension: Secondary | ICD-10-CM | POA: Insufficient documentation

## 2015-05-09 DIAGNOSIS — L03116 Cellulitis of left lower limb: Principal | ICD-10-CM | POA: Insufficient documentation

## 2015-05-09 LAB — BASIC METABOLIC PANEL
Anion gap: 9 (ref 5–15)
BUN: 15 mg/dL (ref 6–20)
CALCIUM: 8.9 mg/dL (ref 8.9–10.3)
CO2: 25 mmol/L (ref 22–32)
Chloride: 99 mmol/L — ABNORMAL LOW (ref 101–111)
Creatinine, Ser: 0.87 mg/dL (ref 0.61–1.24)
GFR calc Af Amer: >60 mL/min (ref >60)
Glucose, Bld: 214 mg/dL — ABNORMAL HIGH (ref 65–99)
POTASSIUM: 3.9 mmol/L (ref 3.5–5.1)
SODIUM: 133 mmol/L — AB (ref 135–145)

## 2015-05-09 LAB — CBC WITH DIFFERENTIAL/PLATELET
Basophils Absolute: 0 10*3/uL (ref 0.0–0.1)
Basophils Relative: 0 %
EOS ABS: 0.1 10*3/uL (ref 0.0–0.7)
EOS PCT: 1 %
HCT: 39.4 % (ref 39.0–52.0)
Hemoglobin: 13.6 g/dL (ref 13.0–17.0)
LYMPHS ABS: 1.9 10*3/uL (ref 0.7–4.0)
Lymphocytes Relative: 19 %
MCH: 30.3 pg (ref 26.0–34.0)
MCHC: 34.5 g/dL (ref 30.0–36.0)
MCV: 87.8 fL (ref 78.0–100.0)
MONO ABS: 1 10*3/uL (ref 0.1–1.0)
Monocytes Relative: 10 %
Neutro Abs: 6.8 10*3/uL (ref 1.7–7.7)
Neutrophils Relative %: 70 %
PLATELETS: 187 10*3/uL (ref 150–400)
RBC: 4.49 MIL/uL (ref 4.22–5.81)
RDW: 13.7 % (ref 11.5–15.5)
WBC: 9.8 10*3/uL (ref 4.0–10.5)

## 2015-05-09 LAB — GLUCOSE, CAPILLARY
GLUCOSE-CAPILLARY: 129 mg/dL — AB (ref 65–99)
Glucose-Capillary: 143 mg/dL — ABNORMAL HIGH (ref 65–99)
Glucose-Capillary: 177 mg/dL — ABNORMAL HIGH (ref 65–99)

## 2015-05-09 MED ORDER — ONDANSETRON HCL 4 MG/2ML IJ SOLN: 4.0000 mg | Freq: Four times a day (QID) | INTRAMUSCULAR | Status: DC | PRN

## 2015-05-09 MED ORDER — GLYBURIDE 5 MG PO TABS
10.0000 mg | ORAL_TABLET | Freq: Two times a day (BID) | ORAL | Status: DC
  Administered 2015-05-09: 10 mg via ORAL
  Filled 2015-05-09: qty 2

## 2015-05-09 MED ORDER — CLINDAMYCIN PHOSPHATE 600 MG/50ML IV SOLN
600.0000 mg | Freq: Three times a day (TID) | INTRAVENOUS | Status: DC
  Administered 2015-05-09: 600 mg via INTRAVENOUS
  Filled 2015-05-09 (×7): qty 50

## 2015-05-09 MED ORDER — ONDANSETRON HCL 4 MG PO TABS: 4.0000 mg | ORAL_TABLET | Freq: Four times a day (QID) | ORAL | Status: DC | PRN

## 2015-05-09 MED ORDER — VANCOMYCIN HCL IN DEXTROSE 1-5 GM/200ML-% IV SOLN
1000.0000 mg | Freq: Once | INTRAVENOUS | Status: AC
Start: 2015-05-09 — End: 2015-05-09
  Administered 2015-05-09: 1000 mg via INTRAVENOUS
  Filled 2015-05-09: qty 200

## 2015-05-09 MED ORDER — OMEGA-3-ACID ETHYL ESTERS 1 G PO CAPS
1.0000 g | ORAL_CAPSULE | Freq: Every day | ORAL | Status: DC
  Administered 2015-05-09: 1 g via ORAL
  Filled 2015-05-09: qty 1

## 2015-05-09 MED ORDER — INSULIN ASPART 100 UNIT/ML ~~LOC~~ SOLN
0.0000 [IU] | Freq: Three times a day (TID) | SUBCUTANEOUS | Status: DC
  Administered 2015-05-09: 3 [IU] via SUBCUTANEOUS

## 2015-05-09 MED ORDER — SODIUM CHLORIDE 0.9 % IV SOLN: 250.0000 mL | INTRAVENOUS | Status: DC | PRN

## 2015-05-09 MED ORDER — LISINOPRIL 5 MG PO TABS: 5.0000 mg | ORAL_TABLET | Freq: Every day | ORAL | Status: DC | PRN

## 2015-05-09 MED ORDER — SODIUM CHLORIDE 0.9 % IV SOLN
INTRAVENOUS | Status: DC
  Administered 2015-05-09: 08:00:00 via INTRAVENOUS

## 2015-05-09 MED ORDER — FUROSEMIDE 40 MG PO TABS: 40.0000 mg | ORAL_TABLET | Freq: Every day | ORAL | Status: DC | PRN

## 2015-05-09 MED ORDER — ENOXAPARIN SODIUM 80 MG/0.8ML ~~LOC~~ SOLN
80.0000 mg | SUBCUTANEOUS | Status: DC
  Administered 2015-05-09: 80 mg via SUBCUTANEOUS
  Filled 2015-05-09: qty 0.8

## 2015-05-09 MED ORDER — SENNOSIDES-DOCUSATE SODIUM 8.6-50 MG PO TABS: 1.0000 | ORAL_TABLET | Freq: Every evening | ORAL | Status: DC | PRN

## 2015-05-09 MED ORDER — SODIUM CHLORIDE 0.9% FLUSH
3.0000 mL | INTRAVENOUS | Status: DC | PRN
Start: 2015-05-09 — End: 2015-05-09

## 2015-05-09 MED ORDER — INSULIN ASPART 100 UNIT/ML ~~LOC~~ SOLN
4.0000 [IU] | Freq: Three times a day (TID) | SUBCUTANEOUS | Status: DC
  Administered 2015-05-09: 4 [IU] via SUBCUTANEOUS

## 2015-05-09 MED ORDER — METOPROLOL TARTRATE 25 MG PO TABS: 25.0000 mg | ORAL_TABLET | Freq: Every day | ORAL | Status: DC | PRN

## 2015-05-09 MED ORDER — OXYCODONE HCL 5 MG PO TABS: 5.0000 mg | ORAL_TABLET | ORAL | Status: DC | PRN

## 2015-05-09 MED ORDER — ALLOPURINOL 300 MG PO TABS
300.0000 mg | ORAL_TABLET | Freq: Every day | ORAL | Status: DC
  Administered 2015-05-09: 300 mg via ORAL
  Filled 2015-05-09: qty 1

## 2015-05-09 MED ORDER — SODIUM CHLORIDE 0.9% FLUSH: 3.0000 mL | Freq: Two times a day (BID) | INTRAVENOUS | Status: DC

## 2015-05-09 NOTE — Progress Notes (Signed)
2146 Patient requested to leave the hospital to go home d/t being concerned for his mother's wellbeing whom he lives with and hasn't been able to reach via telephone for several hours. Patient educated on leaving AMA and understands that in order to return he must go through the ED and be reevaluated. Patient confirmed he understood. IV catheter removed from RIGHT forearm, intact w/no s/s of infection noted. Patient left hospital ambulating w/o difficulty and reported his car is here and will drive himself home. Denied any c/o pain or any other difficulties at this time.

## 2015-05-09 NOTE — ED Provider Notes (Signed)
CSN: [id number]     Arrival date & time 05/09/15  2225 History   First MD Initiated Contact with Patient 05/09/15 2311     Chief Complaint  Patient presents with  . Cellulitis     (Consider location/radiation/quality/duration/timing/severity/associated sxs/prior Treatment) HPI  This a 50 year old male with history of diabetes, hypertension, hypercholesterolemia who was admitted earlier today for progressive cellulitis. He was originally seen 48 hours ago for cellulitis of left foot. He took 3 doses of antibiotic at home but noted that the redness extended beyond the markings on his leg and he had increasing pain. He was seen at 7 AM this morning and admitted. He received several doses of vancomycin. The patient signed out AGAINST MEDICAL ADVICE approximately one hour ago because he was unable to contact his mother and he was worried about her. He was able to contract her upon leaving so returned. Patient states he is no longer having difficulty walking and that the redness is improved dramatically.  I have reviewed the patient's chart. Plan was likely for discharge later tomorrow morning if clinically improved as there was question to whether he was given a full chance to respond to outpatient therapy. Patient denies any changes since leaving the hospital one hour ago. No fevers. He has been ambulatory. Reports last sugar was 170.  Past Medical History  Diagnosis Date  . Diabetes mellitus without complication (HCC)   . Hypertension   . Hypercholesteremia   . Gout   . Bursitis   . Cellulitis    Past Surgical History  Procedure Laterality Date  . Abscess drainage     No family history on file. Social History  Substance Use Topics  . Smoking status: Never Smoker   . Smokeless tobacco: None  . Alcohol Use: No    Review of Systems  Constitutional: Negative for fever.  Skin: Positive for color change.  All other systems reviewed and are negative.     Allergies   Penicillins  Home Medications   Prior to Admission medications   Medication Sig Start Date End Date Taking? Authorizing Provider  allopurinol (ZYLOPRIM) 300 MG tablet Take 300 mg by mouth daily.    Historical Provider, MD  clindamycin (CLEOCIN) 300 MG capsule Take 1 capsule (300 mg total) by mouth 3 (three) times daily. 05/08/15   Kristen N Ward, DO  furosemide (LASIX) 40 MG tablet Take 40 mg by mouth daily as needed for fluid or edema.    Historical Provider, MD  glyBURIDE (DIABETA) 5 MG tablet Take 10 mg by mouth 2 (two) times daily with a meal.    Historical Provider, MD  HYDROcodone-acetaminophen (NORCO/VICODIN) 5-325 MG tablet Take 1-2 tablets by mouth every 6 (six) hours as needed. Patient taking differently: Take 1-2 tablets by mouth every 6 (six) hours as needed for moderate pain.  05/08/15   Kristen N Ward, DO  ibuprofen (ADVIL,MOTRIN) 800 MG tablet Take 1 tablet (800 mg total) by mouth every 8 (eight) hours as needed for mild pain. 05/08/15   Kristen N Ward, DO  Lactobacillus (ACIDOPHILUS PROBIOTIC) TABS Take 1 tablet by mouth 2 (two) times daily. Patient not taking: Reported on 05/09/2015 12/25/14   Leana Roe Elgergawy, MD  lisinopril (PRINIVIL,ZESTRIL) 5 MG tablet Take 5 mg by mouth daily as needed (when blood pressure is high.).     Historical Provider, MD  metFORMIN (GLUCOPHAGE) 1000 MG tablet Take 1,000 mg by mouth 2 (two) times daily with a meal.    Historical Provider, MD  metoprolol tartrate (LOPRESSOR) 25 MG tablet Take 25 mg by mouth daily as needed (palpatations).    Historical Provider, MD  Omega-3 Fatty Acids (FISH OIL) 1000 MG CAPS Take 2 capsules by mouth daily.    Historical Provider, MD  OVER THE COUNTER MEDICATION Apply 1 application topically 3 (three) times daily. Emuaid ointment    Historical Provider, MD  vitamin E 600 UNIT capsule Take 1,200 Units by mouth daily.    Historical Provider, MD   BP 147/80 mmHg  Pulse 86  Temp(Src) 98 F (36.7 C) (Temporal)  Resp 24   SpO2 97% Physical Exam  Constitutional: He is oriented to person, place, and time. No distress.  Morbidly obese  HENT:  Head: Normocephalic and atraumatic.  Cardiovascular: Normal rate, regular rhythm and normal heart sounds.   No murmur heard. Pulmonary/Chest: Effort normal and breath sounds normal. No respiratory distress. He has no wheezes.  Musculoskeletal: He exhibits edema.  1+ left greater than right lower extremity edema  Neurological: He is alert and oriented to person, place, and time.  Skin: Skin is warm and dry.  Warmth and erythema most notably over the dorsum of the left foot extending over the anterior shin and around the calf, he does have some clearing within the lines previously marked, 1+ DP pulse  Psychiatric: He has a normal mood and affect.  Nursing note and vitals reviewed.   ED Course  Procedures (including critical care time) Labs Review Labs Reviewed - No data to display  Imaging Review No results found. I have personally reviewed and evaluated these images and lab results as part of my medical decision-making.   EKG Interpretation None      MDM   Final diagnoses:  Cellulitis of left lower extremity    Patient left one hour ago after being admitted for cellulitis earlier today. He is overall clinically improved after several doses of IV medications in the hospital. Plan for discharge tomorrow morning based on the hospitalist note if patient clinically improved. At this time he is nontoxic. He does appear to have some clearing within the margins previously marked. He is afebrile. Feel it reasonable that patient can be discharged from the emergency department at this time. He has a full 10 day course of clindamycin. He was given return precautions including fevers, increasing redness or pain. Patient stated understanding.  After history, exam, and medical workup I feel the patient has been appropriately medically screened and is safe for discharge  home. Pertinent diagnoses were discussed with the patient. Patient was given return precautions.     Shon Batonourtney F Peyson Postema, MD 05/09/15 [phone]

## 2015-05-09 NOTE — Discharge Instructions (Signed)
You were seen today for further evaluation of your cellulitis.  It appears to be improving.  Take the clindamycin at home as directed.  If you develop worsening redness, fever or any new or worsening symptoms, you should return for repeat evaluation.  Cellulitis Cellulitis is an infection of the skin and the tissue beneath it. The infected area is usually red and tender. Cellulitis occurs most often in the arms and lower legs.  CAUSES  Cellulitis is caused by bacteria that enter the skin through cracks or cuts in the skin. The most common types of bacteria that cause cellulitis are staphylococci and streptococci. SIGNS AND SYMPTOMS   Redness and warmth.  Swelling.  Tenderness or pain.  Fever. DIAGNOSIS  Your health care provider can usually determine what is wrong based on a physical exam. Blood tests may also be done. TREATMENT  Treatment usually involves taking an antibiotic medicine. HOME CARE INSTRUCTIONS   Take your antibiotic medicine as directed by your health care provider. Finish the antibiotic even if you start to feel better.  Keep the infected arm or leg elevated to reduce swelling.  Apply a warm cloth to the affected area up to 4 times per day to relieve pain.  Take medicines only as directed by your health care provider.  Keep all follow-up visits as directed by your health care provider. SEEK MEDICAL CARE IF:   You notice red streaks coming from the infected area.  Your red area gets larger or turns dark in color.  Your bone or joint underneath the infected area becomes painful after the skin has healed.  Your infection returns in the same area or another area.  You notice a swollen bump in the infected area.  You develop new symptoms.  You have a fever. SEEK IMMEDIATE MEDICAL CARE IF:   You feel very sleepy.  You develop vomiting or diarrhea.  You have a general ill feeling (malaise) with muscle aches and pains.   This information is not intended  to replace advice given to you by your health care provider. Make sure you discuss any questions you have with your health care provider.   Document Released: 10/13/2004 Document Revised: 09/24/2014 Document Reviewed: 03/21/2011 Elsevier Interactive Patient Education [employer]2016 [employer]

## 2015-05-09 NOTE — ED Notes (Signed)
Pt reports increasing redness to the left lower leg. Pt being treated for cellulitis, has taken abx as prescribed.

## 2015-05-09 NOTE — ED Provider Notes (Signed)
CSN: [id number]     Arrival date & time 05/09/15  2683 History   First MD Initiated Contact with Patient 05/09/15 0725     Chief Complaint  Patient presents with  . Wound Check     (Consider location/radiation/quality/duration/timing/severity/associated sxs/prior Treatment) Patient is a 50 y.o. male presenting with wound check. The history is provided by the patient.  Wound Check Pertinent negatives include no chest pain, no headaches and no shortness of breath.   The patient for follow-up of his left leg cellulitis. This is patient's fourth visit this month for this problem. Seen yesterday given some IV clindamycin and started on oral clindamycin. Patient on other antibiotics earlier in the month. Patient is followed by the health department. Patient has a history of diabetes. Patient denies fever or chills and states suggests that the redness just keeps getting worse. With yesterday's visit the edges of the cellulitis was marked with a surgical pan and he is extended outside of that.   Past Medical History  Diagnosis Date  . Diabetes mellitus without complication (HCC)   . Hypertension   . Hypercholesteremia   . Gout   . Bursitis   . Cellulitis    Past Surgical History  Procedure Laterality Date  . Abscess drainage     No family history on file. Social History  Substance Use Topics  . Smoking status: Never Smoker   . Smokeless tobacco: None  . Alcohol Use: No    Review of Systems  Constitutional: Negative for fever.  HENT: Negative for congestion.   Eyes: Negative for redness.  Respiratory: Negative for shortness of breath.   Cardiovascular: Positive for leg swelling. Negative for chest pain.  Genitourinary: Negative for dysuria.  Musculoskeletal: Negative for myalgias.  Skin: Negative for wound.  Neurological: Negative for headaches.  Hematological: Does not bruise/bleed easily.  Psychiatric/Behavioral: Negative for confusion.      Allergies   Penicillins  Home Medications   Prior to Admission medications   Medication Sig Start Date End Date Taking? Authorizing Provider  allopurinol (ZYLOPRIM) 300 MG tablet Take 300 mg by mouth daily.    Historical Provider, MD  clindamycin (CLEOCIN) 300 MG capsule Take 1 capsule (300 mg total) by mouth 3 (three) times daily. 05/08/15   Kristen N Ward, DO  doxycycline (VIBRAMYCIN) 100 MG capsule Take 100 mg by mouth 2 (two) times daily.    Historical Provider, MD  furosemide (LASIX) 40 MG tablet Take 40 mg by mouth daily as needed for fluid or edema.    Historical Provider, MD  glyBURIDE (DIABETA) 5 MG tablet Take 10 mg by mouth 2 (two) times daily with a meal.    Historical Provider, MD  HYDROcodone-acetaminophen (NORCO/VICODIN) 5-325 MG tablet Take 1-2 tablets by mouth every 6 (six) hours as needed. 05/08/15   Kristen N Ward, DO  ibuprofen (ADVIL,MOTRIN) 800 MG tablet Take 1 tablet (800 mg total) by mouth every 8 (eight) hours as needed for mild pain. 05/08/15   Kristen N Ward, DO  Lactobacillus (ACIDOPHILUS PROBIOTIC) TABS Take 1 tablet by mouth 2 (two) times daily. 12/25/14   Leana Roe Elgergawy, MD  lisinopril (PRINIVIL,ZESTRIL) 5 MG tablet Take 5 mg by mouth daily.    Historical Provider, MD  metFORMIN (GLUCOPHAGE) 1000 MG tablet Take 1,000 mg by mouth 2 (two) times daily with a meal.    Historical Provider, MD  metoprolol tartrate (LOPRESSOR) 25 MG tablet Take 25 mg by mouth daily as needed (palpatations).    Historical Provider, MD  BP 128/74 mmHg  Pulse 81  Temp(Src) 98.3 F (36.8 C) (Oral)  Resp 20  Ht  (1.753 m)  Wt 181.439 kg  BMI 59.04 kg/m2  SpO2 100% Physical Exam  Constitutional: He is oriented to person, place, and time. He appears well-developed and well-nourished. No distress.  HENT:  Head: Normocephalic and atraumatic.  Mouth/Throat: Oropharynx is clear and moist.  Eyes: Conjunctivae and EOM are normal. Pupils are equal, round, and reactive to light.  Neck: Normal  range of motion. Neck supple.  Cardiovascular: Normal rate, regular rhythm and normal heart sounds.   No murmur heard. Pulmonary/Chest: Effort normal and breath sounds normal. No respiratory distress.  Abdominal: Soft. Bowel sounds are normal. There is no tenderness.  Musculoskeletal: Normal range of motion. He exhibits edema and tenderness.  Redness and swelling to the left foot and left lower extremity. Patient with ink mark outlining the cellulitis from yesterday is extended outside of that. Refill is 1 second to the left foot. No open wounds.  Neurological: He is alert and oriented to person, place, and time. No cranial nerve deficit. He exhibits normal muscle tone. Coordination normal.  Skin: Skin is warm.  Nursing note and vitals reviewed.   ED Course  Procedures (including critical care time) Labs Review Labs Reviewed  BASIC METABOLIC PANEL - Abnormal; Notable for the following:    Sodium 133 (*)    Chloride 99 (*)    Glucose, Bld 214 (*)    All other components within normal limits  CBC WITH DIFFERENTIAL/PLATELET   Results for orders placed or performed during the hospital encounter of 05/09/15  CBC with Differential/Platelet  Result Value Ref Range   WBC 9.8 4.0 - 10.5 K/uL   RBC 4.49 4.22 - 5.81 MIL/uL   Hemoglobin 13.6 13.0 - 17.0 g/dL   HCT 95.6 21.3 - 08.6 %   MCV 87.8 78.0 - 100.0 fL   MCH 30.3 26.0 - 34.0 pg   MCHC 34.5 30.0 - 36.0 g/dL   RDW 57.8 46.9 - 62.9 %   Platelets 187 150 - 400 K/uL   Neutrophils Relative % 70 %   Neutro Abs 6.8 1.7 - 7.7 K/uL   Lymphocytes Relative 19 %   Lymphs Abs 1.9 0.7 - 4.0 K/uL   Monocytes Relative 10 %   Monocytes Absolute 1.0 0.1 - 1.0 K/uL   Eosinophils Relative 1 %   Eosinophils Absolute 0.1 0.0 - 0.7 K/uL   Basophils Relative 0 %   Basophils Absolute 0.0 0.0 - 0.1 K/uL  Basic metabolic panel  Result Value Ref Range   Sodium 133 (L) 135 - 145 mmol/L   Potassium 3.9 3.5 - 5.1 mmol/L   Chloride 99 (L) 101 - 111 mmol/L    CO2 25 22 - 32 mmol/L   Glucose, Bld 214 (H) 65 - 99 mg/dL   BUN 15 6 - 20 mg/dL   Creatinine, Ser 5.28 0.61 - 1.24 mg/dL   Calcium 8.9 8.9 - 41.3 mg/dL   GFR calc non Af Amer >60 >60 mL/min   GFR calc Af Amer >60 >60 mL/min   Anion gap 9 5 - 15     Imaging Review No results found. I have personally reviewed and evaluated these images and lab results as part of my medical decision-making.   EKG Interpretation None      MDM   Final diagnoses:  Cellulitis of left lower extremity    Patient with persistent cellulitis to the left lower extremity  periods been treated on and off on an outpatient basis since the beginning of the month. This is patient's fourth visit this month for the same problem. Patients on oral clindamycin and received IV clindamycin yesterday. There there is extension of the cellulitis. Will require admission. Patient does have a history of diabetes. We'll discuss with hospitalist for admission. Patient started on vancomycin IV here.    Vanetta MuldersScott Zahirah Cheslock, MD 05/09/15 [phone]

## 2015-05-09 NOTE — H&P (Signed)
[facility]          History and Physical    PCP:   MUSE,ROCHELLE D., PA-C   EDP: Fredia Sorrow, M.D.  Chief Complaint:  Left lower leg cellulitis  HPI: Patient is a 50 year old obese man with history of hypertension and diabetes who presents with the above complaints. He first noticed this 3 days ago. Came to the emergency department on Thursday was given 1 dose of IV clindamycin and was prescribed oral clindamycin and discharged home. He took less than 24 hours worth of antibiotics and return to the hospital because he felt like the redness was spreading outside the margins that had been drawn in the ED. The ED gave him a dose of IV vancomycin and requested admission for IV antibiotic therapy. He has been afebrile, WBC count is normal at 9.8.  Allergies:   Allergies  Allergen Reactions  . Penicillins Other (See Comments)    CHILDHOOD ALLERGY Has patient had a PCN reaction causing immediate rash, facial/tongue/throat swelling, SOB or lightheadedness with hypotension: unknown Has patient had a PCN reaction causing severe rash involving mucus membranes or skin necrosis:  unknown Has patient had a PCN reaction that required hospitalization:  no Has patient had a PCN reaction occurring within the last 10 years: no If all of the above answers are "NO", then may proceed with Ceph      Past Medical History  Diagnosis Date  . Diabetes mellitus without complication ([place])   . Hypertension   . Hypercholesteremia   . Gout   . Bursitis   . Cellulitis     Past Surgical History  Procedure Laterality Date  . Abscess drainage      Prior to Admission medications   Medication Sig Start Date End Date Taking? Authorizing Provider  allopurinol (ZYLOPRIM) 300 MG tablet Take 300 mg by mouth daily.   Yes Historical Provider, MD  clindamycin (CLEOCIN) 300 MG capsule Take 1 capsule (300 mg total) by mouth 3 (three) times daily. 05/08/15  Yes Kristen N Ward, DO    furosemide (LASIX) 40 MG tablet Take 40 mg by mouth daily as needed for fluid or edema.   Yes Historical Provider, MD  glyBURIDE (DIABETA) 5 MG tablet Take 10 mg by mouth 2 (two) times daily with a meal.   Yes Historical Provider, MD  HYDROcodone-acetaminophen (NORCO/VICODIN) 5-325 MG tablet Take 1-2 tablets by mouth every 6 (six) hours as needed. Patient taking differently: Take 1-2 tablets by mouth every 6 (six) hours as needed for moderate pain.  05/08/15  Yes Kristen N Ward, DO  ibuprofen (ADVIL,MOTRIN) 800 MG tablet Take 1 tablet (800 mg total) by mouth every 8 (eight) hours as needed for mild pain. 05/08/15  Yes Kristen N Ward, DO  lisinopril (PRINIVIL,ZESTRIL) 5 MG tablet Take 5 mg by mouth daily as needed (when blood pressure is high.).    Yes Historical Provider, MD  metFORMIN (GLUCOPHAGE) 1000 MG tablet Take 1,000 mg by mouth 2 (two) times daily with a meal.   Yes Historical Provider, MD  metoprolol tartrate (LOPRESSOR) 25 MG tablet Take 25 mg by mouth daily as needed (palpatations).   Yes Historical Provider, MD  Omega-3 Fatty Acids (FISH OIL) 1000 MG CAPS Take 2 capsules by mouth daily.   Yes Historical Provider, MD  OVER THE COUNTER MEDICATION Apply 1 application topically 3 (three) times daily. Emuaid ointment   Yes Historical Provider, MD  vitamin E 600 UNIT capsule Take 1,200 Units by mouth daily.   Yes Historical Provider, MD  Lactobacillus (ACIDOPHILUS PROBIOTIC) TABS Take 1 tablet by mouth 2 (two) times daily. Patient not taking: Reported on 05/09/2015 12/25/14   Albertine Patricia, MD    Social History:  reports that he has never smoked. He does not have any smokeless tobacco history on file. He reports that he does not drink alcohol or use illicit drugs.  Family history: No history of hypertension, diabetes, strokes, heart attack  Review of Systems:  Constitutional: Denies fever, chills, diaphoresis, appetite change and fatigue.  HEENT: Denies photophobia, eye pain, redness,  hearing loss, ear pain, congestion, sore throat, rhinorrhea, sneezing, mouth sores, trouble swallowing, neck pain, neck stiffness and tinnitus.   Respiratory: Denies SOB, DOE, cough, chest tightness,  and wheezing.   Cardiovascular: Denies chest pain, palpitations and leg swelling.  Gastrointestinal: Denies nausea, vomiting, abdominal pain, diarrhea, constipation, blood in stool and abdominal distention.  Genitourinary: Denies dysuria, urgency, frequency, hematuria, flank pain and difficulty urinating.  Endocrine: Denies: hot or cold intolerance, sweats, changes in hair or nails, polyuria, polydipsia. Musculoskeletal: Denies myalgias, back pain, joint swelling, arthralgias and gait problem.  Skin: Denies pallor, rash and wound.  Neurological: Denies dizziness, seizures, syncope, weakness, light-headedness, numbness and headaches.  Hematological: Denies adenopathy. Easy bruising, personal or family bleeding history  Psychiatric/Behavioral: Denies suicidal ideation, mood changes, confusion, nervousness, sleep disturbance and agitation   Physical Exam: Blood pressure 123/66, pulse 72, temperature 98.3 F (36.8 C), temperature source Oral, resp. rate 20, height 5' 9" (1.753 m), weight 181.439 kg (400 lb), SpO2 100 %. General: Obese, awake, alert, oriented 3 HEENT: Normocephalic, atraumatic, pupils equal round and reactive to light Neck: Supple, no JVD, lymphadenopathy, no bruits, no goiter sent cardiovascular: Regular rate and rhythm, no murmurs, rubs or gallops + lungs: Clear to rotation bilaterally Abdomen: Soft, nontender, nondistended, positive bowel sounds Extremities: No clubbing, cyanosis or edema, positive pulses on the right, on the left he has erythema from the dorsum of his foot up to about 4 inches below his knee. Neurologic: Grossly intact and nonfocal  Labs on Admission:  Results for orders placed or performed during the hospital encounter of 05/09/15 (from the past 48 hour(s))    CBC with Differential/Platelet     Status: None   Collection Time: 05/09/15  7:52 AM  Result Value Ref Range   WBC 9.8 4.0 - 10.5 K/uL   RBC 4.49 4.22 - 5.81 MIL/uL   Hemoglobin 13.6 13.0 - 17.0 g/dL   HCT 39.4 39.0 - 52.0 %   MCV 87.8 78.0 - 100.0 fL   MCH 30.3 26.0 - 34.0 pg   MCHC 34.5 30.0 - 36.0 g/dL   RDW 13.7 11.5 - 15.5 %   Platelets 187 150 - 400 K/uL   Neutrophils Relative % 70 %   Neutro Abs 6.8 1.7 - 7.7 K/uL   Lymphocytes Relative 19 %   Lymphs Abs 1.9 0.7 - 4.0 K/uL   Monocytes Relative 10 %   Monocytes Absolute 1.0 0.1 - 1.0 K/uL   Eosinophils Relative 1 %   Eosinophils Absolute 0.1 0.0 - 0.7 K/uL   Basophils Relative 0 %   Basophils Absolute 0.0 0.0 - 0.1 K/uL  Basic metabolic panel     Status: Abnormal   Collection Time: 05/09/15  7:52 AM  Result Value Ref Range   Sodium 133 (L) 135 - 145 mmol/L   Potassium 3.9 3.5 - 5.1 mmol/L  Chloride 99 (L) 101 - 111 mmol/L   CO2 25 22 - 32 mmol/L   Glucose, Bld 214 (H) 65 - 99 mg/dL   BUN 15 6 - 20 mg/dL   Creatinine, Ser 0.87 0.61 - 1.24 mg/dL   Calcium 8.9 8.9 - 10.3 mg/dL   GFR calc non Af Amer >60 >60 mL/min   GFR calc Af Amer >60 >60 mL/min    Comment: (NOTE) The eGFR has been calculated using the CKD EPI equation. This calculation has not been validated in all clinical situations. eGFR's persistently <60 mL/min signify possible Chronic Kidney Disease.    Anion gap 9 5 - 15    Radiological Exams on Admission: No results found.  Assessment/Plan Principal Problem:   Cellulitis Active Problems:   HTN (hypertension)   DM type 2 (diabetes mellitus, type 2) (HCC)   Morbid obesity ([place])    Left lower extremity cellulitis -We'll not count this as a failure to clindamycin as he received less than 24 hours worth. -Plan to admit to the hospital as observation, continue IV clindamycin with potential discharge over the next 24 hours as long the cellulitis does not worsen or he develops fever.  Hypertension   -Well-controlled, continue home medications  Type 2 diabetes -Check hemoglobin A1c, start sliding scale insulin.   Morbid obesity -Noted  DVT prophylaxis -Lovenox  CODE STATUS Full code    Time Spent on Admission: 75 minutes  [facility] Pager: [phone] 05/09/2015, 1:21 PM

## 2015-05-09 NOTE — ED Notes (Addendum)
Pt states he was admitted for cellulitis and left AMA due to not being able to get in touch with his mother. Pt has spoken with his mother and here is to be re-evaluated.

## 2015-05-10 LAB — HIV ANTIBODY (ROUTINE TESTING W REFLEX): HIV Screen 4th Generation wRfx: NONREACTIVE

## 2015-05-11 LAB — HEMOGLOBIN A1C
HEMOGLOBIN A1C: 7.6 % — AB (ref 4.8–5.6)
Mean Plasma Glucose: 171 mg/dL

## 2015-05-14 LAB — CULTURE, BLOOD (ROUTINE X 2)
CULTURE: NO GROWTH
CULTURE: NO GROWTH

## 2015-05-25 ENCOUNTER — Ambulatory Visit: Payer: Self-pay | Admitting: Physician Assistant

## 2015-05-25 ENCOUNTER — Encounter: Payer: Self-pay | Admitting: Physician Assistant

## 2015-05-25 VITALS — BP 130/80 | HR 73 | Temp 97.9°F | Ht 69.25 in | Wt 394.0 lb

## 2015-05-25 DIAGNOSIS — I87309 Chronic venous hypertension (idiopathic) without complications of unspecified lower extremity: Secondary | ICD-10-CM | POA: Insufficient documentation

## 2015-05-25 DIAGNOSIS — E1165 Type 2 diabetes mellitus with hyperglycemia: Secondary | ICD-10-CM

## 2015-05-25 DIAGNOSIS — F39 Unspecified mood [affective] disorder: Secondary | ICD-10-CM

## 2015-05-25 DIAGNOSIS — I87303 Chronic venous hypertension (idiopathic) without complications of bilateral lower extremity: Secondary | ICD-10-CM

## 2015-05-25 DIAGNOSIS — IMO0002 Reserved for concepts with insufficient information to code with codable children: Secondary | ICD-10-CM | POA: Insufficient documentation

## 2015-05-25 DIAGNOSIS — E118 Type 2 diabetes mellitus with unspecified complications: Principal | ICD-10-CM

## 2015-05-25 DIAGNOSIS — I10 Essential (primary) hypertension: Secondary | ICD-10-CM

## 2015-05-25 DIAGNOSIS — E785 Hyperlipidemia, unspecified: Secondary | ICD-10-CM

## 2015-05-25 LAB — GLUCOSE, POCT (MANUAL RESULT ENTRY): POC Glucose: 168 mg/dl — AB (ref 70–99)

## 2015-05-25 NOTE — Progress Notes (Signed)
BP 130/80 mmHg  Pulse 73  Temp(Src) 97.9 F (36.6 C)  Ht 5' 9.25" (1.759 m)  Wt 394 lb (178.717 kg)  BMI 57.76 kg/m2  SpO2 97%   Subjective:    Patient ID: Nathan Velez, male    DOB: 07/02/1965, 50 y.o.   MRN: [id number]  HPI: Nathan Velez is a 50 y.o. male presenting on 05/25/2015 for New Patient (Initial Visit)   HPI   [facility].  Last appt was in February.  He is not planning on returning there.  He says his last a1c was improved to "about a 7 or 8" but he doesn't remember more specificallty  Pt of [facility]- saw dr Geanie CooleyLay- in the past - not seen there since December.    Pt says he filed for disability due to his edema.  Pt says he applied for medicare/medicaid but was denied.   a1c done has hospital on  05/09/15 was 7.6  Relevant past medical, surgical, family and social history reviewed and updated as indicated. Interim medical history since our last visit reviewed. Allergies and medications reviewed and updated.4   Current outpatient prescriptions:  .  allopurinol (ZYLOPRIM) 300 MG tablet, Take 300 mg by mouth daily., Disp: , Rfl:  .  citalopram (CELEXA) 20 MG tablet, Take 20 mg by mouth daily., Disp: , Rfl:  .  furosemide (LASIX) 40 MG tablet, Take 40 mg by mouth daily as needed for fluid or edema. Reported on 05/25/2015, Disp: , Rfl:  .  glyBURIDE (DIABETA) 5 MG tablet, Take 10 mg by mouth 2 (two) times daily with a meal., Disp: , Rfl:  .  ibuprofen (ADVIL,MOTRIN) 800 MG tablet, Take 1 tablet (800 mg total) by mouth every 8 (eight) hours as needed for mild pain., Disp: 30 tablet, Rfl: 0 .  Lactobacillus (ACIDOPHILUS PROBIOTIC) TABS, Take 1 tablet by mouth 2 (two) times daily., Disp: 30 tablet, Rfl: 0 .  lisinopril (PRINIVIL,ZESTRIL) 5 MG tablet, Take 5 mg by mouth daily. , Disp: , Rfl:  .  metFORMIN (GLUCOPHAGE) 1000 MG tablet, Take 1,000 mg by mouth 2 (two) times daily with a meal., Disp: , Rfl:  .  metoprolol tartrate  (LOPRESSOR) 25 MG tablet, Take 25 mg by mouth daily as needed (palpatations)., Disp: , Rfl:  .  Omega-3 Fatty Acids (FISH OIL) 1000 MG CAPS, Take 2 capsules by mouth daily., Disp: , Rfl:  .  OVER THE COUNTER MEDICATION, Apply 1 application topically 3 (three) times daily. Emuaid ointment, Disp: , Rfl:  .  Potassium Chloride (KLOR-CON PO), Take 30 mg by mouth daily., Disp: , Rfl:  .  vitamin E 600 UNIT capsule, Take 1,200 Units by mouth daily., Disp: , Rfl:    Review of Systems  Constitutional: Positive for appetite change and fatigue. Negative for fever, chills, diaphoresis and unexpected weight change.  HENT: Positive for dental problem and hearing loss. Negative for congestion, drooling, ear pain, facial swelling, mouth sores, sneezing, sore throat, trouble swallowing and voice change.   Eyes: Negative for pain, discharge, redness, itching and visual disturbance.  Respiratory: Negative for cough, choking, shortness of breath and wheezing.   Cardiovascular: Positive for leg swelling. Negative for chest pain and palpitations.  Gastrointestinal: Negative for vomiting, abdominal pain, diarrhea, constipation and blood in stool.  Endocrine: Negative for cold intolerance, heat intolerance and polydipsia.  Genitourinary: Negative for dysuria, hematuria and decreased urine volume.  Musculoskeletal: Positive for back pain, arthralgias and gait  problem.  Skin: Negative for rash.  Allergic/Immunologic: Positive for environmental allergies.  Neurological: Negative for seizures, syncope, light-headedness and headaches.  Hematological: Negative for adenopathy.  Psychiatric/Behavioral: Positive for dysphoric mood and agitation. Negative for suicidal ideas. The patient is nervous/anxious.     Per HPI unless specifically indicated above     Objective:    BP 130/80 mmHg  Pulse 73  Temp(Src) 97.9 F (36.6 C)  Ht 5' 9.25" (1.759 m)  Wt 394 lb (178.717 kg)  BMI 57.76 kg/m2  SpO2 97%  Wt Readings  from Last 3 Encounters:  05/25/15 394 lb (178.717 kg)  05/09/15 400 lb (181.439 kg)  05/08/15 400 lb (181.439 kg)    Physical Exam  Constitutional: He is oriented to person, place, and time. He appears well-developed and well-nourished.  HENT:  Head: Normocephalic and atraumatic.  Mouth/Throat: Oropharynx is clear and moist. No oropharyngeal exudate.  Eyes: Conjunctivae and EOM are normal. Pupils are equal, round, and reactive to light.  Neck: Neck supple. No thyromegaly present.  Cardiovascular: Normal rate and regular rhythm.   Pulmonary/Chest: Effort normal and breath sounds normal. He has no wheezes. He has no rales.  Abdominal: Soft. Bowel sounds are normal. He exhibits no mass. There is no hepatosplenomegaly. There is no tenderness.  obese  Musculoskeletal: He exhibits edema (trace BLE edema).  Lymphadenopathy:    He has no cervical adenopathy.  Neurological: He is alert and oriented to person, place, and time.  Skin: Skin is warm and dry. No rash noted.  Psychiatric: He has a normal mood and affect. His behavior is normal. Thought content normal.  Vitals reviewed.  foot exam done  Results for orders placed or performed during the hospital encounter of 05/09/15  Culture, blood (routine x 2)  Result Value Ref Range   Specimen Description BLOOD BLOOD LEFT HAND    Special Requests BOTTLES DRAWN AEROBIC ONLY 4CC    Culture NO GROWTH 5 DAYS    Report Status 05/14/2015 FINAL   Culture, blood (routine x 2)  Result Value Ref Range   Specimen Description BLOOD LEFT ANTECUBITAL    Special Requests BOTTLES DRAWN AEROBIC AND ANAEROBIC 4CC    Culture NO GROWTH 5 DAYS    Report Status 05/14/2015 FINAL   CBC with Differential/Platelet  Result Value Ref Range   WBC 9.8 4.0 - 10.5 K/uL   RBC 4.49 4.22 - 5.81 MIL/uL   Hemoglobin 13.6 13.0 - 17.0 g/dL   HCT 78.2 95.6 - 21.3 %   MCV 87.8 78.0 - 100.0 fL   MCH 30.3 26.0 - 34.0 pg   MCHC 34.5 30.0 - 36.0 g/dL   RDW 08.6 57.8 - 46.9 %    Platelets 187 150 - 400 K/uL   Neutrophils Relative % 70 %   Neutro Abs 6.8 1.7 - 7.7 K/uL   Lymphocytes Relative 19 %   Lymphs Abs 1.9 0.7 - 4.0 K/uL   Monocytes Relative 10 %   Monocytes Absolute 1.0 0.1 - 1.0 K/uL   Eosinophils Relative 1 %   Eosinophils Absolute 0.1 0.0 - 0.7 K/uL   Basophils Relative 0 %   Basophils Absolute 0.0 0.0 - 0.1 K/uL  Basic metabolic panel  Result Value Ref Range   Sodium 133 (L) 135 - 145 mmol/L   Potassium 3.9 3.5 - 5.1 mmol/L   Chloride 99 (L) 101 - 111 mmol/L   CO2 25 22 - 32 mmol/L   Glucose, Bld 214 (H) 65 - 99 mg/dL  BUN 15 6 - 20 mg/dL   Creatinine, Ser 1.61 0.61 - 1.24 mg/dL   Calcium 8.9 8.9 - 09.6 mg/dL   GFR calc non Af Amer >60 >60 mL/min   GFR calc Af Amer >60 >60 mL/min   Anion gap 9 5 - 15  HIV antibody  Result Value Ref Range   HIV Screen 4th Generation wRfx Non Reactive Non Reactive  Hemoglobin A1c  Result Value Ref Range   Hgb A1c MFr Bld 7.6 (H) 4.8 - 5.6 %   Mean Plasma Glucose 171 mg/dL  Glucose, capillary  Result Value Ref Range   Glucose-Capillary 143 (H) 65 - 99 mg/dL  Glucose, capillary  Result Value Ref Range   Glucose-Capillary 177 (H) 65 - 99 mg/dL   Comment 1 Notify RN    Comment 2 Document in Chart   Glucose, capillary  Result Value Ref Range   Glucose-Capillary 129 (H) 65 - 99 mg/dL   Comment 1 Notify RN    Comment 2 Document in Chart       Assessment & Plan:   Encounter Diagnoses  Name Primary?  Marland Kitchen Uncontrolled type 2 diabetes mellitus with complication, unspecified long term insulin use status (HCC) Yes  . Essential hypertension   . Morbid obesity, unspecified obesity type (HCC)   . Mood disorder (HCC)   . Hyperlipidemia   . Stasis edema, bilateral     -get pt signed up for [facility] -discontinue glyburide.  Add [place]- gave samples to start right away and will order from [employer] -Get fasting labs tomorrow morning -pt to attend DM education class -counseled extensively about need to  get wt down to help with his diabetes as well as his chronic stasis edema and overall health -pt counseled to return to [facility] for MH issues -F/u 1 month

## 2015-05-25 NOTE — Patient Instructions (Addendum)
Stop glyburide Start januvia once daily Do to diabetes education class Get fasting labs (blood) drawn Call [facility] for follow-up appointment for anxiety/depression Diet/exercise for weight loss  Obesity Obesity is defined as having too much total body fat and a body mass index (BMI) of 30 or more. BMI is an estimate of body fat and is calculated from your height and weight. BMI is typically calculated by your health care provider during regular wellness visits. Obesity happens when you consume more calories than you can burn by exercising or performing daily physical tasks. Prolonged obesity can cause major illnesses or emergencies, such as:  Stroke.  Heart disease.  Diabetes.  Cancer.  Arthritis.  High blood pressure (hypertension).  High cholesterol.  Sleep apnea.  Erectile dysfunction.  Infertility problems. CAUSES   Regularly eating unhealthy foods.  Physical inactivity.  Certain disorders, such as an underactive thyroid (hypothyroidism), Cushing's syndrome, and polycystic ovarian syndrome.  Certain medicines, such as steroids, some depression medicines, and antipsychotics.  Genetics.  Lack of sleep. DIAGNOSIS A health care provider can diagnose obesity after calculating your BMI. Obesity will be diagnosed if your BMI is 30 or higher. There are other methods of measuring obesity levels. Some other methods include measuring your skinfold thickness, your waist circumference, and comparing your hip circumference to your waist circumference. TREATMENT  A healthy treatment program includes some or all of the following:  Long-term dietary changes.  Exercise and physical activity.  Behavioral and lifestyle changes.  Medicine only under the supervision of your health care provider. Medicines may help, but only if they are used with diet and exercise programs. If your BMI is 40 or higher, your health care provider may recommend specialized surgery or programs to help  with weight loss. An unhealthy treatment program includes:  Fasting.  Fad diets.  Supplements and drugs. These choices do not succeed in long-term weight control. HOME CARE INSTRUCTIONS  Exercise and perform physical activity as directed by your health care provider. To increase physical activity, try the following:  Use stairs instead of elevators.  Park farther away from store entrances.  Garden, bike, or walk instead of watching television or using the computer.  Eat healthy, low-calorie foods and drinks on a regular basis. Eat more fruits and vegetables. Use low-calorie cookbooks or take healthy cooking classes.  Limit fast food, sweets, and processed snack foods.  Eat smaller portions.  Keep a daily journal of everything you eat. There are many free websites to help you with this. It may be helpful to measure your foods so you can determine if you are eating the correct portion sizes.  Avoid drinking alcohol. Drink more water and drinks without calories.  Take vitamins and supplements only as recommended by your health care provider.  Weight-loss support groups, Government social research officerregistered dietitians, counselors, and stress reduction education can also be very helpful. SEEK IMMEDIATE MEDICAL CARE IF:  You have chest pain or tightness.  You have trouble breathing or feel short of breath.  You have weakness or leg numbness.  You feel confused or have trouble talking.  You have sudden changes in your vision.   This information is not intended to replace advice given to you by your health care provider. Make sure you discuss any questions you have with your health care provider.   Document Released: 02/11/2004 Document Revised: 01/24/2014 Document Reviewed: 02/09/2011 Elsevier Interactive Patient Education [employer]2016 [employer] Inc.

## 2015-05-31 ENCOUNTER — Emergency Department (HOSPITAL_COMMUNITY)
Admission: EM | Admit: 2015-05-31 | Discharge: 2015-06-01 | Disposition: A | Discharge status: Home / Self Care | Payer: Self-pay | Attending: Emergency Medicine | Admitting: Emergency Medicine

## 2015-05-31 ENCOUNTER — Encounter (HOSPITAL_COMMUNITY): Payer: Self-pay | Admitting: Emergency Medicine

## 2015-05-31 DIAGNOSIS — Z7984 Long term (current) use of oral hypoglycemic drugs: Secondary | ICD-10-CM | POA: Insufficient documentation

## 2015-05-31 DIAGNOSIS — L03116 Cellulitis of left lower limb: Secondary | ICD-10-CM

## 2015-05-31 DIAGNOSIS — Z791 Long term (current) use of non-steroidal anti-inflammatories (NSAID): Secondary | ICD-10-CM | POA: Insufficient documentation

## 2015-05-31 DIAGNOSIS — I10 Essential (primary) hypertension: Secondary | ICD-10-CM | POA: Insufficient documentation

## 2015-05-31 DIAGNOSIS — E119 Type 2 diabetes mellitus without complications: Secondary | ICD-10-CM | POA: Insufficient documentation

## 2015-05-31 DIAGNOSIS — Z79899 Other long term (current) drug therapy: Secondary | ICD-10-CM | POA: Insufficient documentation

## 2015-05-31 DIAGNOSIS — F329 Major depressive disorder, single episode, unspecified: Secondary | ICD-10-CM | POA: Insufficient documentation

## 2015-05-31 LAB — CBC WITH DIFFERENTIAL/PLATELET
Basophils Absolute: 0 10*3/uL (ref 0.0–0.1)
Basophils Relative: 0 %
Eosinophils Absolute: 0.1 10*3/uL (ref 0.0–0.7)
Eosinophils Relative: 1 %
HEMATOCRIT: 42.2 % (ref 39.0–52.0)
Hemoglobin: 14.3 g/dL (ref 13.0–17.0)
LYMPHS ABS: 2.2 10*3/uL (ref 0.7–4.0)
Lymphocytes Relative: 16 %
MCH: 30.4 pg (ref 26.0–34.0)
MCHC: 33.9 g/dL (ref 30.0–36.0)
MCV: 89.6 fL (ref 78.0–100.0)
Monocytes Absolute: 0.6 10*3/uL (ref 0.1–1.0)
Monocytes Relative: 5 %
NEUTROS ABS: 10.2 10*3/uL — AB (ref 1.7–7.7)
NEUTROS PCT: 78 %
Platelets: 215 10*3/uL (ref 150–400)
RBC: 4.71 MIL/uL (ref 4.22–5.81)
RDW: 13.9 % (ref 11.5–15.5)
WBC: 13.2 10*3/uL — ABNORMAL HIGH (ref 4.0–10.5)

## 2015-05-31 NOTE — ED Notes (Addendum)
Documented on wrong patient. 

## 2015-05-31 NOTE — ED Notes (Signed)
Patient states "I think I was bitten by something in January and ever since then I keep having to get treated for cellulitis." States he was hospitalized here for same. Complaining of burning to left lower extremity since yesterday.

## 2015-05-31 NOTE — ED Provider Notes (Signed)
CSN: [id number]     Arrival date & time 05/31/15  2116 History   First MD Initiated Contact with Patient 05/31/15 2230     Chief Complaint  Patient presents with  . Recurrent Skin Infections     (Consider location/radiation/quality/duration/timing/severity/associated sxs/prior Treatment) The history is provided by the patient.   Nathan Velez is a 50 y.o. male with hx of diabetes, HTN, Hyercholesterolenemia and hx  who presents to the ED with left lower extremity redness and tenderness and itching. He reports the area started as just a tiny red area that looked like an insect bite and then began spreading. Patient was admitted for cellulitis 05/09/15. Patient states that his doctor told him that if this happen again to come in as soon as he notices the area to avoid having to be admitted again. The symptoms just started yesterday but he noted increased redness this evening. He describes the pain as mild and a burning sensation.   Past Medical History  Diagnosis Date  . Diabetes mellitus without complication (HCC)   . Hypertension   . Hypercholesteremia   . Gout   . Bursitis   . Cellulitis     L Leg  . Panic attacks   . Sleep apnea   . Edema   . Tendinitis   . Murmur, heart   . IBS (irritable bowel syndrome)   . PTSD (post-traumatic stress disorder)   . Depression    Past Surgical History  Procedure Laterality Date  . Abscess drainage     Family History  Problem Relation Age of Onset  . Osteoporosis Mother   . Heart disease Mother   . Hypertension Mother   . Hyperlipidemia Mother   . Anemia Mother    Social History  Substance Use Topics  . Smoking status: Never Smoker   . Smokeless tobacco: Never Used  . Alcohol Use: No    Review of Systems Negative except as stated in HPI   Allergies  Penicillins  Home Medications   Prior to Admission medications   Medication Sig Start Date End Date Taking? Authorizing Provider  allopurinol (ZYLOPRIM) 300 MG tablet  Take 300 mg by mouth daily.   Yes Historical Provider, MD  citalopram (CELEXA) 20 MG tablet Take 20 mg by mouth daily.   Yes Historical Provider, MD  furosemide (LASIX) 40 MG tablet Take 40 mg by mouth daily as needed for fluid.   Yes Historical Provider, MD  ibuprofen (ADVIL,MOTRIN) 800 MG tablet Take 1 tablet (800 mg total) by mouth every 8 (eight) hours as needed for mild pain. 05/08/15  Yes Kristen N Ward, DO  lisinopril (PRINIVIL,ZESTRIL) 5 MG tablet Take 5 mg by mouth daily.    Yes Historical Provider, MD  metFORMIN (GLUCOPHAGE) 1000 MG tablet Take 1,000 mg by mouth 2 (two) times daily with a meal.   Yes Historical Provider, MD  metoprolol tartrate (LOPRESSOR) 25 MG tablet Take 25 mg by mouth daily as needed (palpatations).   Yes Historical Provider, MD  Neomycin-Bacitracin-Polymyxin (TRIPLE ANTIBIOTIC EX) Apply 1 application topically daily as needed (for legs (skin irritation)).   Yes Historical Provider, MD  Omega-3 Fatty Acids (FISH OIL) 1000 MG CAPS Take 2 capsules by mouth daily.   Yes Historical Provider, MD  vitamin E 600 UNIT capsule Take 1,200 Units by mouth daily.   Yes Historical Provider, MD  clindamycin (CLEOCIN) 300 MG capsule Take 1 capsule (300 mg total) by mouth 3 (three) times daily. 06/01/15   Bronislaw Switzer Orlene OchM Lomax Poehler,  NP  HYDROcodone-acetaminophen (NORCO/VICODIN) 5-325 MG tablet Take 2 tablets by mouth every 4 (four) hours as needed. 06/01/15   Milbern Doescher Orlene Och, NP  sitaGLIPtin (JANUVIA) 100 MG tablet Take 1 tablet (100 mg total) by mouth daily. 06/01/15   Jacquelin Hawking, PA-C   BP 128/71 mmHg  Pulse 83  Temp(Src) 98.7 F (37.1 C) (Oral)  Resp 19  Ht 5\' 9"  (1.753 m)  Wt 178.944 kg  BMI 58.23 kg/m2  SpO2 99% Physical Exam  Constitutional: He is oriented to person, place, and time. No distress.  Morbidly obese  Eyes: EOM are normal.  Neck: Neck supple.  Pulmonary/Chest: Effort normal.  Abdominal: Soft. There is no tenderness.  Musculoskeletal:  Anterior aspect of the left lower  leg with erythema. Area of erythema marked. Pedal pulses 2+.   Neurological: He is alert and oriented to person, place, and time. No cranial nerve deficit.  Skin: Skin is warm and dry.  Area of erythema 15 x 8 cm to the lower left leg. Increased warmth and tenderness  Psychiatric: He has a normal mood and affect. His behavior is normal.  Nursing note and vitals reviewed.   ED Course  Procedures (including critical care time) Labs, IV, Vancomycin, pain management.  Labs Review Results for orders placed or performed during the hospital encounter of 05/31/15 (from the past 24 hour(s))  CBC with Differential/Platelet     Status: Abnormal   Collection Time: 05/31/15 11:35 PM  Result Value Ref Range   WBC 13.2 (H) 4.0 - 10.5 K/uL   RBC 4.71 4.22 - 5.81 MIL/uL   Hemoglobin 14.3 13.0 - 17.0 g/dL   HCT 91.4 78.2 - 95.6 %   MCV 89.6 78.0 - 100.0 fL   MCH 30.4 26.0 - 34.0 pg   MCHC 33.9 30.0 - 36.0 g/dL   RDW 21.3 08.6 - 57.8 %   Platelets 215 150 - 400 K/uL   Neutrophils Relative % 78 %   Neutro Abs 10.2 (H) 1.7 - 7.7 K/uL   Lymphocytes Relative 16 %   Lymphs Abs 2.2 0.7 - 4.0 K/uL   Monocytes Relative 5 %   Monocytes Absolute 0.6 0.1 - 1.0 K/uL   Eosinophils Relative 1 %   Eosinophils Absolute 0.1 0.0 - 0.7 K/uL   Basophils Relative 0 %   Basophils Absolute 0.0 0.0 - 0.1 K/uL  Basic metabolic panel     Status: Abnormal   Collection Time: 05/31/15 11:35 PM  Result Value Ref Range   Sodium 133 (L) 135 - 145 mmol/L   Potassium 3.9 3.5 - 5.1 mmol/L   Chloride 100 (L) 101 - 111 mmol/L   CO2 26 22 - 32 mmol/L   Glucose, Bld 121 (H) 65 - 99 mg/dL   BUN 10 6 - 20 mg/dL   Creatinine, Ser 4.69 0.61 - 1.24 mg/dL   Calcium 9.2 8.9 - 62.9 mg/dL   GFR calc non Af Amer >60 >60 mL/min   GFR calc Af Amer >60 >60 mL/min   Anion gap 7 5 - 15    Imaging Review No results found. I have personally reviewed and evaluated the lab results as part of my medical decision-making. I have reviewed the  previous visits   MDM  50 y.o. morbidly obese male with redness and increased warmth to the left lower leg. Vancomycin 1 gram IV tonight, elevation of the leg. Patient d/c home with Rx for antibiotics and pain medication. Area of redness marked. Patient to return if redness  goes outside the lines. Discussed with the patient and all questioned fully answered.    Final diagnoses:  Cellulitis of left lower leg       [facility], NP 06/01/15 1812  Dione Booze, MD 06/01/15 2245

## 2015-06-01 ENCOUNTER — Inpatient Hospital Stay (HOSPITAL_COMMUNITY)
Admission: EM | Admit: 2015-06-01 | Discharge: 2015-06-03 | DRG: 603 | Disposition: A | Discharge status: Home / Self Care | Payer: Self-pay | Attending: Internal Medicine | Admitting: Internal Medicine

## 2015-06-01 ENCOUNTER — Other Ambulatory Visit: Payer: Self-pay | Admitting: Physician Assistant

## 2015-06-01 ENCOUNTER — Encounter (HOSPITAL_COMMUNITY): Payer: Self-pay

## 2015-06-01 DIAGNOSIS — L0390 Cellulitis, unspecified: Secondary | ICD-10-CM | POA: Diagnosis present

## 2015-06-01 DIAGNOSIS — Z8249 Family history of ischemic heart disease and other diseases of the circulatory system: Secondary | ICD-10-CM

## 2015-06-01 DIAGNOSIS — E7800 Pure hypercholesterolemia, unspecified: Secondary | ICD-10-CM | POA: Diagnosis present

## 2015-06-01 DIAGNOSIS — F410 Panic disorder [episodic paroxysmal anxiety] without agoraphobia: Secondary | ICD-10-CM | POA: Diagnosis present

## 2015-06-01 DIAGNOSIS — L03116 Cellulitis of left lower limb: Principal | ICD-10-CM | POA: Diagnosis present

## 2015-06-01 DIAGNOSIS — Z6841 Body Mass Index (BMI) 40.0 and over, adult: Secondary | ICD-10-CM

## 2015-06-01 DIAGNOSIS — F4310 Post-traumatic stress disorder, unspecified: Secondary | ICD-10-CM | POA: Diagnosis present

## 2015-06-01 DIAGNOSIS — I10 Essential (primary) hypertension: Secondary | ICD-10-CM | POA: Diagnosis present

## 2015-06-01 DIAGNOSIS — G4730 Sleep apnea, unspecified: Secondary | ICD-10-CM | POA: Diagnosis present

## 2015-06-01 DIAGNOSIS — E785 Hyperlipidemia, unspecified: Secondary | ICD-10-CM | POA: Diagnosis present

## 2015-06-01 DIAGNOSIS — Z8262 Family history of osteoporosis: Secondary | ICD-10-CM

## 2015-06-01 DIAGNOSIS — E119 Type 2 diabetes mellitus without complications: Secondary | ICD-10-CM

## 2015-06-01 DIAGNOSIS — Z7984 Long term (current) use of oral hypoglycemic drugs: Secondary | ICD-10-CM

## 2015-06-01 LAB — BASIC METABOLIC PANEL
Anion gap: 13 (ref 5–15)
Anion gap: 7 (ref 5–15)
BUN: 10 mg/dL (ref 6–20)
BUN: 10 mg/dL (ref 6–20)
CALCIUM: 9.8 mg/dL (ref 8.9–10.3)
CO2: 26 mmol/L (ref 22–32)
CO2: 27 mmol/L (ref 22–32)
CREATININE: 0.8 mg/dL (ref 0.61–1.24)
CREATININE: 0.61 mg/dL (ref 0.61–1.24)
Calcium: 9.2 mg/dL (ref 8.9–10.3)
Chloride: 100 mmol/L — ABNORMAL LOW (ref 101–111)
Chloride: 96 mmol/L — ABNORMAL LOW (ref 101–111)
GFR calc Af Amer: >60 mL/min (ref >60)
GFR calc Af Amer: >60 mL/min (ref >60)
Glucose, Bld: 121 mg/dL — ABNORMAL HIGH (ref 65–99)
Glucose, Bld: 135 mg/dL — ABNORMAL HIGH (ref 65–99)
POTASSIUM: 4.4 mmol/L (ref 3.5–5.1)
Potassium: 3.9 mmol/L (ref 3.5–5.1)
SODIUM: 136 mmol/L (ref 135–145)
SODIUM: 133 mmol/L — AB (ref 135–145)

## 2015-06-01 LAB — CBC WITH DIFFERENTIAL/PLATELET
BASOS ABS: 0 10*3/uL (ref 0.0–0.1)
Basophils Relative: 0 %
EOS ABS: 0.1 10*3/uL (ref 0.0–0.7)
EOS PCT: 1 %
HCT: 43.4 % (ref 39.0–52.0)
Hemoglobin: 14.8 g/dL (ref 13.0–17.0)
LYMPHS ABS: 2 10*3/uL (ref 0.7–4.0)
Lymphocytes Relative: 21 %
MCH: 30.5 pg (ref 26.0–34.0)
MCHC: 34.1 g/dL (ref 30.0–36.0)
MCV: 89.5 fL (ref 78.0–100.0)
Monocytes Absolute: 0.9 10*3/uL (ref 0.1–1.0)
Monocytes Relative: 9 %
Neutro Abs: 6.7 10*3/uL (ref 1.7–7.7)
Neutrophils Relative %: 69 %
PLATELETS: 208 10*3/uL (ref 150–400)
RBC: 4.85 MIL/uL (ref 4.22–5.81)
RDW: 14 % (ref 11.5–15.5)
WBC: 9.8 10*3/uL (ref 4.0–10.5)

## 2015-06-01 LAB — GLUCOSE, CAPILLARY: GLUCOSE-CAPILLARY: 149 mg/dL — AB (ref 65–99)

## 2015-06-01 MED ORDER — SITAGLIPTIN PHOSPHATE 100 MG PO TABS: 100.0000 mg | ORAL_TABLET | Freq: Every day | ORAL | Status: DC

## 2015-06-01 MED ORDER — VANCOMYCIN HCL IN DEXTROSE 1-5 GM/200ML-% IV SOLN
1000.0000 mg | Freq: Once | INTRAVENOUS | Status: AC
  Administered 2015-06-01: 1000 mg via INTRAVENOUS
  Filled 2015-06-01: qty 200

## 2015-06-01 MED ORDER — HYDROCODONE-ACETAMINOPHEN 5-325 MG PO TABS: 2.0000 | ORAL_TABLET | ORAL | Status: DC | PRN

## 2015-06-01 MED ORDER — CLINDAMYCIN HCL 300 MG PO CAPS: 300.0000 mg | ORAL_CAPSULE | Freq: Three times a day (TID) | ORAL | Status: DC

## 2015-06-01 MED ORDER — HYDROCODONE-ACETAMINOPHEN 5-325 MG PO TABS
1.0000 | ORAL_TABLET | Freq: Once | ORAL | Status: AC
  Administered 2015-06-01: 1 via ORAL
  Filled 2015-06-01: qty 1

## 2015-06-01 MED FILL — Hydrocodone-Acetaminophen Tab 5-325 MG: ORAL | Qty: 6 | Status: AC

## 2015-06-01 NOTE — Discharge Instructions (Signed)
Follow up with your doctor tomorrow for recheck. Return here for worsening symptoms  Cellulitis Cellulitis is an infection of the skin and the tissue beneath it. The infected area is usually red and tender. Cellulitis occurs most often in the arms and lower legs.  CAUSES  Cellulitis is caused by bacteria that enter the skin through cracks or cuts in the skin. The most common types of bacteria that cause cellulitis are staphylococci and streptococci. SIGNS AND SYMPTOMS   Redness and warmth.  Swelling.  Tenderness or pain.  Fever. DIAGNOSIS  Your health care provider can usually determine what is wrong based on a physical exam. Blood tests may also be done. TREATMENT  Treatment usually involves taking an antibiotic medicine. HOME CARE INSTRUCTIONS   Take your antibiotic medicine as directed by your health care provider. Finish the antibiotic even if you start to feel better.  Keep the infected arm or leg elevated to reduce swelling.  Apply a warm cloth to the affected area up to 4 times per day to relieve pain.  Take medicines only as directed by your health care provider.  Keep all follow-up visits as directed by your health care provider. SEEK MEDICAL CARE IF:   You notice red streaks coming from the infected area.  Your red area gets larger or turns dark in color.  Your bone or joint underneath the infected area becomes painful after the skin has healed.  Your infection returns in the same area or another area.  You notice a swollen bump in the infected area.  You develop new symptoms.  You have a fever. SEEK IMMEDIATE MEDICAL CARE IF:   You feel very sleepy.  You develop vomiting or diarrhea.  You have a general ill feeling (malaise) with muscle aches and pains.   This information is not intended to replace advice given to you by your health care provider. Make sure you discuss any questions you have with your health care provider.   Document Released:  10/13/2004 Document Revised: 09/24/2014 Document Reviewed: 03/21/2011 Elsevier Interactive Patient Education [employer]2016 Elsevier Inc.

## 2015-06-01 NOTE — ED Provider Notes (Signed)
CSN: [id number]     Arrival date & time 06/01/15  1805 History  By signing my name below, I, Marisue Humble, attest that this documentation has been prepared under the direction and in the presence of Bethann Berkshire, MD . Electronically Signed: Marisue Humble, Scribe. 06/01/2015. 8:29 PM.   Chief Complaint  Patient presents with  . Leg Pain   Patient is a 50 y.o. male presenting with leg pain. The history is provided by the patient. No language interpreter was used.  Leg Pain Location:  Leg Time since incident:  4 days Injury: no   Leg location:  L leg Pain details:    Onset quality:  Gradual   Progression:  Worsening Chronicity:  Recurrent Foreign body present:  No foreign bodies Relieved by:  Nothing Associated symptoms: no back pain and no fatigue    HPI Comments:  Nathan Velez is a 50 y.o. male with PMHx of DM, HTN, HLD, gout and cellulitis who presents to the Emergency Department complaining of worsening redness to left lower extremity onset 4 days ago. Pt reports associated leg pain. He was evaluated in the ED last night and instructed to return if redness exceeded margins. Pt started taking Clindamycin early this morning without relief. He had a similar episode of cellulitis ~1 month ago and notes it resolved with Clindamycin.  Past Medical History  Diagnosis Date  . Diabetes mellitus without complication (HCC)   . Hypertension   . Hypercholesteremia   . Gout   . Bursitis   . Cellulitis     L Leg  . Panic attacks   . Sleep apnea   . Edema   . Tendinitis   . Murmur, heart   . IBS (irritable bowel syndrome)   . PTSD (post-traumatic stress disorder)   . Depression    Past Surgical History  Procedure Laterality Date  . Abscess drainage     Family History  Problem Relation Age of Onset  . Osteoporosis Mother   . Heart disease Mother   . Hypertension Mother   . Hyperlipidemia Mother   . Anemia Mother    Social History  Substance Use Topics  . Smoking  status: Never Smoker   . Smokeless tobacco: Never Used  . Alcohol Use: No    Review of Systems  Constitutional: Negative for appetite change and fatigue.  HENT: Negative for congestion, ear discharge and sinus pressure.   Eyes: Negative for discharge.  Respiratory: Negative for cough.   Cardiovascular: Negative for chest pain.  Gastrointestinal: Negative for abdominal pain and diarrhea.  Genitourinary: Negative for frequency and hematuria.  Musculoskeletal: Positive for arthralgias. Negative for back pain.  Skin: Positive for rash.  Neurological: Negative for seizures and headaches.  Psychiatric/Behavioral: Negative for hallucinations.   Allergies  Penicillins  Home Medications   Prior to Admission medications   Medication Sig Start Date End Date Taking? Authorizing Provider  allopurinol (ZYLOPRIM) 300 MG tablet Take 300 mg by mouth daily.   Yes Historical Provider, MD  citalopram (CELEXA) 20 MG tablet Take 20 mg by mouth daily.   Yes Historical Provider, MD  clindamycin (CLEOCIN) 300 MG capsule Take 1 capsule (300 mg total) by mouth 3 (three) times daily. 06/01/15  Yes Hope Orlene Och, NP  furosemide (LASIX) 40 MG tablet Take 40 mg by mouth daily as needed for fluid.   Yes Historical Provider, MD  HYDROcodone-acetaminophen (NORCO/VICODIN) 5-325 MG tablet Take 2 tablets by mouth every 4 (four) hours as needed. 06/01/15  Yes  Hope Orlene OchM Neese, NP  ibuprofen (ADVIL,MOTRIN) 800 MG tablet Take 1 tablet (800 mg total) by mouth every 8 (eight) hours as needed for mild pain. 05/08/15  Yes Kristen N Ward, DO  lisinopril (PRINIVIL,ZESTRIL) 5 MG tablet Take 5 mg by mouth daily.    Yes Historical Provider, MD  metFORMIN (GLUCOPHAGE) 1000 MG tablet Take 1,000 mg by mouth 2 (two) times daily with a meal.   Yes Historical Provider, MD  metoprolol tartrate (LOPRESSOR) 25 MG tablet Take 25 mg by mouth daily as needed (palpatations).   Yes Historical Provider, MD  Neomycin-Bacitracin-Polymyxin (TRIPLE  ANTIBIOTIC EX) Apply 1 application topically daily as needed (for legs (skin irritation)).   Yes Historical Provider, MD  Omega-3 Fatty Acids (FISH OIL) 1000 MG CAPS Take 2 capsules by mouth daily.   Yes Historical Provider, MD  sitaGLIPtin (JANUVIA) 100 MG tablet Take 1 tablet (100 mg total) by mouth daily. 06/01/15  Yes Jacquelin HawkingShannon McElroy, PA-C  vitamin E 600 UNIT capsule Take 1,200 Units by mouth daily.   Yes Historical Provider, MD   BP 136/83 mmHg  Pulse 85  Temp(Src) 98 F (36.7 C) (Oral)  Resp 18  Ht 5\' 9"  (1.753 m)  Wt 394 lb (178.717 kg)  BMI 58.16 kg/m2  SpO2 99% Physical Exam  Constitutional: He is oriented to person, place, and time. He appears well-developed.  HENT:  Head: Normocephalic.  Eyes: Conjunctivae are normal.  Neck: No tracheal deviation present.  Cardiovascular:  No murmur heard. Musculoskeletal: Normal range of motion.  Neurological: He is oriented to person, place, and time.  Skin: Skin is warm.  Cellulitis to anterior part of distal leg, worse than yesterday  Psychiatric: He has a normal mood and affect.    ED Course  Procedures  DIAGNOSTIC STUDIES:  Oxygen Saturation is 98% on RA, normal by my interpretation.    COORDINATION OF CARE:  8:24 PM Will consult hospitalist regarding admission. Will start on Vancomycin. Discussed treatment plan with pt at bedside and pt agreed to plan.  Labs Review Labs Reviewed  BASIC METABOLIC PANEL - Abnormal; Notable for the following:    Chloride 96 (*)    Glucose, Bld 135 (*)    All other components within normal limits  CBC WITH DIFFERENTIAL/PLATELET    Imaging Review No results found. I have personally reviewed and evaluated these images and lab results as part of my medical decision-making.   EKG Interpretation None      MDM   Final diagnoses:  None    Cellulitis,  Admit for iv antibiotics   Bethann BerkshireJoseph Gilles Trimpe, MD 06/01/15 2203

## 2015-06-01 NOTE — ED Notes (Signed)
Pt reports he was seen yesterday for leg pain and redness. Instructed to return if redness exceeded the margins. Pt reports noted approx 1 pm that redness was spreading. Started po antibiotics this morning

## 2015-06-01 NOTE — H&P (Signed)
History and Physical    Nathan Velez UEA:[id number] DOB: 11/29/1965 DOA: 06/01/2015  PCP: Jacquelin HawkingShannon McElroy, PA-C  Patient coming from: Home  Chief Complaint: Worsening skin changes in left leg; recurrent cellulitis  HPI: Nathan GritBasilakis J Milbourne is a 50 y.o. gentleman with a history of uncontrolled Type 2 DM (last A1c 7.6% 05/09/2015), morbid obesity, HTN, dyslipidemia, anxiety, and gout who presents to the ED at [facility] for further management of progressive erythema and swelling to his left lower extremity, consistent with recurrent cellulitis.  The patient has had prior episodes of cellulitis affecting his left leg.  In late April, he completed a course of oral clindamycin and saw resolution of his symptoms.  He says that he has also been treated with doxycycline in the past.  This time, he noticed what he thought was a insect bit to the posterior aspect of his left leg, just above his ankle, sometime on Friday.  He did not have fever, chills, sweats, nausea, or significant pain in the leg.  However, he developed increased erythema and edema that has progressed over the weekend.  He presented to the ED yesterday and had a mild leukocytosis at that time.  He received one gram of IV vancomycin and received a prescription for a course of oral clindamycin.  He came back tonight for repeat evaluation due to the rapid progression of his skin changes.  ED Course: The patient has received one gram of IV vancomycin.  Hospitalist asked to admit for further management.  Review of Systems: Polyuria, nocturia, but no hematuria or dysuria.  No diarrhea.  Progressive weight gain over the past five years.  Otherwise, 10 systems reviewed and negative except as stated in the HPI.    Past Medical History  Diagnosis Date  . Diabetes mellitus without complication (HCC)   . Hypertension   . Hypercholesteremia   . Gout   . Bursitis   . Cellulitis     L Leg  . Panic attacks   . Sleep apnea   . Edema   .  Tendinitis   . Murmur, heart   . IBS (irritable bowel syndrome)   . PTSD (post-traumatic stress disorder)   . Depression     Past Surgical History  Procedure Laterality Date  . Abscess drainage    Required extensive surgery to scrotum/groin secondary to infected ingrown hair.   reports that he has never smoked. He has never used smokeless tobacco. He reports that he does not drink alcohol or use illicit drugs.   He is not married; reports fiance recently deceased.  He does not have any biological children.  He is unemployed.   Allergies  Allergen Reactions  . Penicillins Other (See Comments)    CHILDHOOD ALLERGY Has patient had a PCN reaction causing immediate rash, facial/tongue/throat swelling, SOB or lightheadedness with hypotension: unknown Has patient had a PCN reaction causing severe rash involving mucus membranes or skin necrosis:  unknown Has patient had a PCN reaction that required hospitalization:  no Has patient had a PCN reaction occurring within the last 10 years: no If all of the above answers are "NO", then may proceed with Ceph    Family History  Problem Relation Age of Onset  . Osteoporosis Mother   . Heart disease Mother   . Hypertension Mother   . Hyperlipidemia Mother   . Anemia Mother    Prior to Admission medications   Medication Sig Start Date End Date Taking? Authorizing Provider  allopurinol (ZYLOPRIM) 300  MG tablet Take 300 mg by mouth daily.   Yes Historical Provider, MD  citalopram (CELEXA) 20 MG tablet Take 20 mg by mouth daily.   Yes Historical Provider, MD  clindamycin (CLEOCIN) 300 MG capsule Take 1 capsule (300 mg total) by mouth 3 (three) times daily. 06/01/15  Yes Hope Orlene Och, NP  furosemide (LASIX) 40 MG tablet Take 40 mg by mouth daily as needed for fluid.   Yes Historical Provider, MD  HYDROcodone-acetaminophen (NORCO/VICODIN) 5-325 MG tablet Take 2 tablets by mouth every 4 (four) hours as needed. 06/01/15  Yes Hope Orlene Och, NP  ibuprofen  (ADVIL,MOTRIN) 800 MG tablet Take 1 tablet (800 mg total) by mouth every 8 (eight) hours as needed for mild pain. 05/08/15  Yes Kristen N Ward, DO  lisinopril (PRINIVIL,ZESTRIL) 5 MG tablet Take 5 mg by mouth daily.    Yes Historical Provider, MD  metFORMIN (GLUCOPHAGE) 1000 MG tablet Take 1,000 mg by mouth 2 (two) times daily with a meal.   Yes Historical Provider, MD  metoprolol tartrate (LOPRESSOR) 25 MG tablet Take 25 mg by mouth daily as needed (palpatations).   Yes Historical Provider, MD  Neomycin-Bacitracin-Polymyxin (TRIPLE ANTIBIOTIC EX) Apply 1 application topically daily as needed (for legs (skin irritation)).   Yes Historical Provider, MD  Omega-3 Fatty Acids (FISH OIL) 1000 MG CAPS Take 2 capsules by mouth daily.   Yes Historical Provider, MD  sitaGLIPtin (JANUVIA) 100 MG tablet Take 1 tablet (100 mg total) by mouth daily. 06/01/15  Yes Jacquelin Hawking, PA-C  vitamin E 600 UNIT capsule Take 1,200 Units by mouth daily.   Yes Historical Provider, MD   OF NOTE, patient reports that he is out of most of his home medications and is expecting to get refills at his next PCP visit.  Physical Exam: Filed Vitals:   06/01/15 2030 06/01/15 2100 06/01/15 2130 06/01/15 2200  BP: 109/70 119/69 120/73 131/85  Pulse: 74 72 80 78  Temp:      TempSrc:      Resp:      Height:      Weight:      SpO2: 97% 92% 96% 99%   Constitutional: NAD, calm, comfortable Filed Vitals:   06/01/15 2030 06/01/15 2100 06/01/15 2130 06/01/15 2200  BP: 109/70 119/69 120/73 131/85  Pulse: 74 72 80 78  Temp:      TempSrc:      Resp:      Height:      Weight:      SpO2: 97% 92% 96% 99%   Eyes: PERRL, lids and conjunctivae normal ENMT: Mucous membranes are moist. Posterior pharynx clear of any exudate or lesions. Normal dentition.  Neck: normal, supple, no masses Respiratory: clear to auscultation bilaterally, no wheezing, no crackles. Normal respiratory effort. No accessory muscle use.  Cardiovascular: NR/RR  no murmurs / rubs / gallops. Trace to 1+ edema in bilateral lower extremities, left greater than right.  2+ pedal pulses.   GI: obese abdomen is soft and compressible.  No tenderness, no masses palpated. Bowel sounds are present.    Musculoskeletal: no clubbing / cyanosis. No joint deformity upper and lower extremities. Good ROM, no contractures. Normal muscle tone.  No diabetic foot ulcers.  Skin on both feet intact. Skin: Large area of erythema and warmth involving most of his left leg.  No significant areas of induration.  No apparent drainage or ulceration at this point.  Chronic stasis changes in his right leg. Neurologic: CN 2-12 grossly  intact. Sensation intact, Strength symmetric bilaterally Psychiatric: Normal judgment and insight. Alert and oriented x 3. Normal mood.   Labs on Admission: I have personally reviewed following labs and imaging studies  CBC:  Recent Labs Lab 05/31/15 2335 06/01/15 2030  WBC 13.2* 9.8  NEUTROABS 10.2* 6.7  HGB 14.3 14.8  HCT 42.2 43.4  MCV 89.6 89.5  PLT 215 208   Basic Metabolic Panel:  Recent Labs Lab 05/31/15 2335 06/01/15 2030  NA 133* 136  K 3.9 4.4  CL 100* 96*  CO2 26 27  GLUCOSE 121* 135*  BUN 10 10  CREATININE 0.61 0.80  CALCIUM 9.2 9.8   GFR: Estimated Creatinine Clearance: 179.9 mL/min (by C-G formula based on Cr of 0.8).  Radiological Exams on Admission: No results found.  Assessment/Plan Principal Problem:   Cellulitis Active Problems:   HTN (hypertension)   DM type 2 (diabetes mellitus, type 2) (HCC)   Morbid obesity (HCC)  Recurrent cellulitis, failed outpatient management with progression of disease --IV clindamycin and IV vancomycin --Will ask RN to mark margins qShift to monitor response to therapy --Blood cultures for fever greater than 101F --Repeat CBC, CMP in the AM --Analgesics as needed  HTN --Low dose lisinopril (as previously prescribed)  Type 2 DM --Hold metformin and januvia for  now --Glucose monitoring AC/HS with SSI coverage --Diabetic diet  History of anxiety/mood disturbance --Continue home dose of citalopram  DVT prophylaxis: Lovenox Code Status: Full Family Communication: Patient alone at time of admission Disposition Plan: Expect him to discharge to home when appropriate Consults called: NONE Admission status: Inpatient, med surg.  I do not expect him to be ready for discharge by tomorrow.   Jerene Bears MD [facility]   06/01/2015, 10:19 PM

## 2015-06-02 ENCOUNTER — Encounter (HOSPITAL_COMMUNITY): Payer: Self-pay | Admitting: *Deleted

## 2015-06-02 ENCOUNTER — Ambulatory Visit: Payer: Self-pay | Admitting: Physician Assistant

## 2015-06-02 LAB — COMPREHENSIVE METABOLIC PANEL
ALK PHOS: 46 U/L (ref 38–126)
ALT: 19 U/L (ref 17–63)
ANION GAP: 8 (ref 5–15)
AST: 18 U/L (ref 15–41)
Albumin: 4 g/dL (ref 3.5–5.0)
BILIRUBIN TOTAL: 2.2 mg/dL — AB (ref 0.3–1.2)
BUN: 9 mg/dL (ref 6–20)
CO2: 27 mmol/L (ref 22–32)
Calcium: 9 mg/dL (ref 8.9–10.3)
Chloride: 99 mmol/L — ABNORMAL LOW (ref 101–111)
Creatinine, Ser: 0.75 mg/dL (ref 0.61–1.24)
Glucose, Bld: 131 mg/dL — ABNORMAL HIGH (ref 65–99)
Potassium: 3.7 mmol/L (ref 3.5–5.1)
SODIUM: 134 mmol/L — AB (ref 135–145)
TOTAL PROTEIN: 7.5 g/dL (ref 6.5–8.1)

## 2015-06-02 LAB — CBC
HEMATOCRIT: 41.5 % (ref 39.0–52.0)
HEMOGLOBIN: 14 g/dL (ref 13.0–17.0)
MCH: 30.3 pg (ref 26.0–34.0)
MCHC: 33.7 g/dL (ref 30.0–36.0)
MCV: 89.8 fL (ref 78.0–100.0)
Platelets: 211 10*3/uL (ref 150–400)
RBC: 4.62 MIL/uL (ref 4.22–5.81)
RDW: 13.9 % (ref 11.5–15.5)
WBC: 9.8 10*3/uL (ref 4.0–10.5)

## 2015-06-02 LAB — GLUCOSE, CAPILLARY
GLUCOSE-CAPILLARY: 167 mg/dL — AB (ref 65–99)
GLUCOSE-CAPILLARY: 158 mg/dL — AB (ref 65–99)
Glucose-Capillary: 168 mg/dL — ABNORMAL HIGH (ref 65–99)
Glucose-Capillary: 139 mg/dL — ABNORMAL HIGH (ref 65–99)

## 2015-06-02 MED ORDER — ONDANSETRON HCL 4 MG PO TABS: 4.0000 mg | ORAL_TABLET | Freq: Four times a day (QID) | ORAL | Status: DC | PRN

## 2015-06-02 MED ORDER — VANCOMYCIN HCL 10 G IV SOLR
1500.0000 mg | Freq: Once | INTRAVENOUS | Status: AC
  Administered 2015-06-02: 1500 mg via INTRAVENOUS
  Filled 2015-06-02: qty 1500

## 2015-06-02 MED ORDER — CLINDAMYCIN PHOSPHATE 600 MG/50ML IV SOLN
INTRAVENOUS | Status: AC
  Filled 2015-06-02: qty 100

## 2015-06-02 MED ORDER — INSULIN ASPART 100 UNIT/ML ~~LOC~~ SOLN
0.0000 [IU] | Freq: Three times a day (TID) | SUBCUTANEOUS | Status: DC
  Administered 2015-06-02 – 2015-06-03 (×4): 4 [IU] via SUBCUTANEOUS
  Administered 2015-06-03: 3 [IU] via SUBCUTANEOUS
  Administered 2015-06-03: 4 [IU] via SUBCUTANEOUS

## 2015-06-02 MED ORDER — ENOXAPARIN SODIUM 100 MG/ML ~~LOC~~ SOLN
90.0000 mg | SUBCUTANEOUS | Status: DC
  Administered 2015-06-02 – 2015-06-03 (×2): 90 mg via SUBCUTANEOUS
  Filled 2015-06-02 (×2): qty 1

## 2015-06-02 MED ORDER — ACETAMINOPHEN 325 MG PO TABS
650.0000 mg | ORAL_TABLET | Freq: Four times a day (QID) | ORAL | Status: DC | PRN
  Administered 2015-06-02: 650 mg via ORAL
  Filled 2015-06-02: qty 2

## 2015-06-02 MED ORDER — ONDANSETRON HCL 4 MG/2ML IJ SOLN: 4.0000 mg | Freq: Four times a day (QID) | INTRAMUSCULAR | Status: DC | PRN

## 2015-06-02 MED ORDER — ENOXAPARIN SODIUM 80 MG/0.8ML ~~LOC~~ SOLN: 80.0000 mg | SUBCUTANEOUS | Status: DC

## 2015-06-02 MED ORDER — VANCOMYCIN HCL 10 G IV SOLR
1500.0000 mg | Freq: Three times a day (TID) | INTRAVENOUS | Status: DC
  Administered 2015-06-02 – 2015-06-03 (×5): 1500 mg via INTRAVENOUS
  Filled 2015-06-02 (×7): qty 1500

## 2015-06-02 MED ORDER — HYDROCODONE-ACETAMINOPHEN 5-325 MG PO TABS: 1.0000 | ORAL_TABLET | ORAL | Status: DC | PRN

## 2015-06-02 MED ORDER — CITALOPRAM HYDROBROMIDE 20 MG PO TABS
20.0000 mg | ORAL_TABLET | Freq: Every day | ORAL | Status: DC
  Administered 2015-06-02 – 2015-06-03 (×2): 20 mg via ORAL
  Filled 2015-06-02 (×2): qty 1

## 2015-06-02 MED ORDER — CLINDAMYCIN PHOSPHATE 600 MG/50ML IV SOLN
600.0000 mg | Freq: Three times a day (TID) | INTRAVENOUS | Status: DC
  Administered 2015-06-02 (×2): 600 mg via INTRAVENOUS
  Filled 2015-06-02 (×6): qty 50

## 2015-06-02 MED ORDER — LISINOPRIL 5 MG PO TABS
5.0000 mg | ORAL_TABLET | Freq: Every day | ORAL | Status: DC
  Administered 2015-06-02 – 2015-06-03 (×2): 5 mg via ORAL
  Filled 2015-06-02 (×2): qty 1

## 2015-06-02 MED ORDER — ALLOPURINOL 300 MG PO TABS
300.0000 mg | ORAL_TABLET | Freq: Every day | ORAL | Status: DC
  Administered 2015-06-02 – 2015-06-03 (×2): 300 mg via ORAL
  Filled 2015-06-02 (×2): qty 1

## 2015-06-02 MED ORDER — ACETAMINOPHEN 650 MG RE SUPP: 650.0000 mg | Freq: Four times a day (QID) | RECTAL | Status: DC | PRN

## 2015-06-02 MED ORDER — ENOXAPARIN SODIUM 40 MG/0.4ML ~~LOC~~ SOLN
40.0000 mg | SUBCUTANEOUS | Status: DC
Start: 2015-06-02 — End: 2015-06-02

## 2015-06-02 NOTE — Care Management Note (Signed)
Case Management Note  Patient Details  Name: Nathan Velez MRN: [id number] Date of Birth: 12/12/1965  Subjective/Objective:                  Pt is from home, lives with his mother and is ind with ADL's. Pt Has no insurance, [facility] and [facility] ind with [facility]. Pt [facility]. Pt drives himself to appointments. Pt plans to return home with self care. Pt may need assistance with meds if abx is expensive.   Action/Plan: No CM needs if abx is on the $4 list. Will follow.   Expected Discharge Date:    06/03/2015              Expected Discharge Plan:  Home/Self Care  In-House Referral:  NA  Discharge planning Services  CM Consult  Post Acute Care Choice:  NA Choice offered to:  NA  DME Arranged:    DME Agency:     HH Arranged:    HH Agency:     Status of Service:  Completed, signed off  Medicare Important Message Given:    Date Medicare IM Given:    Medicare IM give by:    Date Additional Medicare IM Given:    Additional Medicare Important Message give by:     If discussed at Long Length of Stay Meetings, dates discussed:    Additional Comments:  Malcolm MetroChildress, Cailen Mihalik Demske, RN 06/02/2015, 3:25 PM

## 2015-06-02 NOTE — Progress Notes (Signed)
Pharmacy Antibiotic Note  Nathan Velez is an obese 50 y.o. male admitted on 06/01/2015 with cellulitis.  Pharmacy has been consulted for vancomycin dosing.  He returned to the ED today after being sent home with  PO clindamycin early this am after receiving vanc 1 g x1 in the ED last night for cellulitis of his LLE.  ABX: vanc 1 g x 1 0115 in ED, sent home on PO clinda (x1 dose early this am) then received another vanc 1g x1 upon readmission to ED tonight at 2039.  Plan: - Vancomycin 1500 mg IV x 1 now (to make 2500 load), then Vancomycin 1500 IV every 8 hours.  Goal trough 10-15 mcg/mL.  - F/u vancomycin trough at [facility], prior to 4th dose - Follow up SCr, UOP, cultures, clinical course and adjust as clinically indicated - Adjust lovenox to 0.5 mg SQ q24h for DVT plx in this obese patient with BMI >30 >>> Lovenox 90 mg sq q24h  Height: 5\' 9"  (175.3 cm) Weight: (!) 383 lb 14.4 oz (174.136 kg) IBW/kg (Calculated) : 70.7  Temp (24hrs), Avg:98.3 F (36.8 C), Min:97.8 F (36.6 C), Max:98.7 F (37.1 C)   Recent Labs Lab 05/31/15 2335 06/01/15 2030  WBC 13.2* 9.8  CREATININE 0.61 0.80    Estimated Creatinine Clearance: 177.1 mL/min (by C-G formula based on Cr of 0.8).    Allergies  Allergen Reactions  . Penicillins Other (See Comments)    CHILDHOOD ALLERGY Has patient had a PCN reaction causing immediate rash, facial/tongue/throat swelling, SOB or lightheadedness with hypotension: unknown Has patient had a PCN reaction causing severe rash involving mucus membranes or skin necrosis:  unknown Has patient had a PCN reaction that required hospitalization:  no Has patient had a PCN reaction occurring within the last 10 years: no If all of the above answers are "NO", then may proceed with Ceph    Antimicrobials this admission: vanocmycin 1g >> at 0115 and 2039 today   Thank you for allowing pharmacy to be a part of this patient's care.  Drusilla KannerGrimsley, Jabe Jeanbaptiste Lydia 06/02/2015 12:30  AM

## 2015-06-02 NOTE — Progress Notes (Signed)
[facility] PROGRESS NOTE  Nathan Velez UEA:[id number] DOB: March 13, 1965    PCP:   Jacquelin Hawking, PA-C   HPI: Nathan Velez is an 50 y.o. male with hx of DM, morbid obesity, HTN, HLD, with recurrent infection of his lower extremities, admitted yesterday for same.  He was started on IV Van/ IV Clinda, and the area of erythema has improved.   No complaints.   Rewiew of Systems:  Constitutional: Negative for malaise, fever and chills. No significant weight loss or weight gain Eyes: Negative for eye pain, redness and discharge, diplopia, visual changes, or flashes of light. ENMT: Negative for ear pain, hoarseness, nasal congestion, sinus pressure and sore throat. No headaches; tinnitus, drooling, or problem swallowing. Cardiovascular: Negative for chest pain, palpitations, diaphoresis, dyspnea and peripheral edema. ; No orthopnea, PND Respiratory: Negative for cough, hemoptysis, wheezing and stridor. No pleuritic chestpain. Gastrointestinal: Negative for nausea, vomiting, diarrhea, constipation, abdominal pain, melena, blood in stool, hematemesis, jaundice and rectal bleeding.    Genitourinary: Negative for frequency, dysuria, incontinence,flank pain and hematuria; Musculoskeletal: Negative for back pain and neck pain. Negative for swelling and trauma.;  Skin: . Negative for pruritus, rash, abrasions, bruising and skin lesion.; ulcerations  Redness on the left lower leg.  Neuro: Negative for headache, lightheadedness and neck stiffness. Negative for weakness, altered level of consciousness , altered mental status, extremity weakness, burning feet, involuntary movement, seizure and syncope.  Psych: negative for anxiety, depression, insomnia, tearfulness, panic attacks, hallucinations, paranoia, suicidal or homicidal ideation    Past Medical History  Diagnosis Date  . Diabetes mellitus without complication (HCC)   . Hypertension   . Hypercholesteremia   . Gout   . Bursitis    . Cellulitis     L Leg  . Panic attacks   . Sleep apnea   . Edema   . Tendinitis   . Murmur, heart   . IBS (irritable bowel syndrome)   . PTSD (post-traumatic stress disorder)   . Depression     Past Surgical History  Procedure Laterality Date  . Abscess drainage      Medications:  HOME MEDS: Prior to Admission medications   Medication Sig Start Date End Date Taking? Authorizing Provider  allopurinol (ZYLOPRIM) 300 MG tablet Take 300 mg by mouth daily.   Yes Historical Provider, MD  citalopram (CELEXA) 20 MG tablet Take 20 mg by mouth daily.   Yes Historical Provider, MD  clindamycin (CLEOCIN) 300 MG capsule Take 1 capsule (300 mg total) by mouth 3 (three) times daily. 06/01/15  Yes Hope Orlene Och, NP  furosemide (LASIX) 40 MG tablet Take 40 mg by mouth daily as needed for fluid.   Yes Historical Provider, MD  HYDROcodone-acetaminophen (NORCO/VICODIN) 5-325 MG tablet Take 2 tablets by mouth every 4 (four) hours as needed. 06/01/15  Yes Hope Orlene Och, NP  ibuprofen (ADVIL,MOTRIN) 800 MG tablet Take 1 tablet (800 mg total) by mouth every 8 (eight) hours as needed for mild pain. 05/08/15  Yes Kristen N Ward, DO  lisinopril (PRINIVIL,ZESTRIL) 5 MG tablet Take 5 mg by mouth daily.    Yes Historical Provider, MD  metFORMIN (GLUCOPHAGE) 1000 MG tablet Take 1,000 mg by mouth 2 (two) times daily with a meal.   Yes Historical Provider, MD  metoprolol tartrate (LOPRESSOR) 25 MG tablet Take 25 mg by mouth daily as needed (palpatations).   Yes Historical Provider, MD  Neomycin-Bacitracin-Polymyxin (TRIPLE ANTIBIOTIC EX) Apply 1 application topically daily as needed (for legs (skin irritation)).  Yes Historical Provider, MD  Omega-3 Fatty Acids (FISH OIL) 1000 MG CAPS Take 2 capsules by mouth daily.   Yes Historical Provider, MD  sitaGLIPtin (JANUVIA) 100 MG tablet Take 1 tablet (100 mg total) by mouth daily. 06/01/15  Yes Jacquelin HawkingShannon McElroy, PA-C  vitamin E 600 UNIT capsule Take 1,200 Units by mouth  daily.   Yes Historical Provider, MD     Allergies:  Allergies  Allergen Reactions  . Penicillins Other (See Comments)    CHILDHOOD ALLERGY Has patient had a PCN reaction causing immediate rash, facial/tongue/throat swelling, SOB or lightheadedness with hypotension: unknown Has patient had a PCN reaction causing severe rash involving mucus membranes or skin necrosis:  unknown Has patient had a PCN reaction that required hospitalization:  no Has patient had a PCN reaction occurring within the last 10 years: no If all of the above answers are "NO", then may proceed with Ceph    Social History:   reports that he has never smoked. He has never used smokeless tobacco. He reports that he does not drink alcohol or use illicit drugs.  Family History: Family History  Problem Relation Age of Onset  . Osteoporosis Mother   . Heart disease Mother   . Hypertension Mother   . Hyperlipidemia Mother   . Anemia Mother      Physical Exam: Filed Vitals:   06/01/15 2300 06/01/15 2322 06/01/15 2336 06/02/15 0500  BP: 116/79  97/58 155/63  Pulse: 73  84 75  Temp:  97.8 F (36.6 C) 98.2 F (36.8 C) 97.9 F (36.6 C)  TempSrc:  Oral Oral Oral  Resp:   18 18  Height:   5\' 9"  (1.753 m)   Weight:   174.136 kg (383 lb 14.4 oz)   SpO2: 93%  94% 94%   Blood pressure 155/63, pulse 75, temperature 97.9 F (36.6 C), temperature source Oral, resp. rate 18, height 5\' 9"  (1.753 m), weight 174.136 kg (383 lb 14.4 oz), SpO2 94 %.  GEN:  Pleasant  patient lying in the stretcher in no acute distress; cooperative with exam. PSYCH:  alert and oriented x4; does not appear anxious or depressed; affect is appropriate. HEENT: Mucous membranes pink and anicteric; PERRLA; EOM intact; no cervical lymphadenopathy nor thyromegaly or carotid bruit; no JVD; There were no stridor. Neck is very supple. Breasts:: Not examined CHEST WALL: No tenderness CHEST: Normal respiration, clear to auscultation bilaterally.   HEART: Regular rate and rhythm.  There are no murmur, rub, or gallops.   BACK: No kyphosis or scoliosis; no CVA tenderness ABDOMEN: soft and non-tender; no masses, no organomegaly, normal abdominal bowel sounds; no pannus; no intertriginous candida. There is no rebound and no distention. Rectal Exam: Not done EXTREMITIES: No bone or joint deformity; age-appropriate arthropathy of the hands and knees; no edema; no ulcerations.  There is no calf tenderness. Genitalia: not examined PULSES: 2+ and symmetric SKIN: Normal hydration no rash or ulceration.  Erythema has improved.  CNS: Cranial nerves 2-12 grossly intact no focal lateralizing neurologic deficit.  Speech is fluent; uvula elevated with phonation, facial symmetry and tongue midline. DTR are normal bilaterally, cerebella exam is intact, barbinski is negative and strengths are equaled bilaterally.  No sensory loss.   Labs on Admission:  Basic Metabolic Panel:  Recent Labs Lab 05/31/15 2335 06/01/15 2030 06/02/15 0518  NA 133* 136 134*  K 3.9 4.4 3.7  CL 100* 96* 99*  CO2 26 27 27   GLUCOSE 121* 135* 131*  BUN 10  10 9  CREATININE 0.61 0.80 0.75  CALCIUM 9.2 9.8 9.0   Liver Function Tests:  Recent Labs Lab 06/02/15 0518  AST 18  ALT 19  ALKPHOS 46  BILITOT 2.2*  PROT 7.5  ALBUMIN 4.0   CBC:  Recent Labs Lab 05/31/15 2335 06/01/15 2030 06/02/15 0518  WBC 13.2* 9.8 9.8  NEUTROABS 10.2* 6.7  --   HGB 14.3 14.8 14.0  HCT 42.2 43.4 41.5  MCV 89.6 89.5 89.8  PLT 215 208 211   CBG:  Recent Labs Lab 06/01/15 2339 06/02/15 0813  GLUCAP 149* 167*   Assessment/Plan Present on Admission:  . Cellulitis . HTN (hypertension) . Morbid obesity (HCC)  PLAN:  Cellulitis:  Improving.  Will d/c Clindamycin and continue with IV vancomycin at this time.  Consider Bactrim upon discharge, given possible allergy to PCN.    HTN: Continue betablocker and ACE I.   Controlled.  DM:  Continue SSI.  BS 140-160.   Other  plans as per orders. Code Status: FULL Unk Lightning, MD.  FACP [facility] Pager [phone] 7pm to 7am.  06/02/2015, 10:59 AM

## 2015-06-03 DIAGNOSIS — L03116 Cellulitis of left lower limb: Principal | ICD-10-CM

## 2015-06-03 DIAGNOSIS — I10 Essential (primary) hypertension: Secondary | ICD-10-CM

## 2015-06-03 DIAGNOSIS — E119 Type 2 diabetes mellitus without complications: Secondary | ICD-10-CM

## 2015-06-03 LAB — GLUCOSE, CAPILLARY
Glucose-Capillary: 166 mg/dL — ABNORMAL HIGH (ref 65–99)
Glucose-Capillary: 155 mg/dL — ABNORMAL HIGH (ref 65–99)
Glucose-Capillary: 144 mg/dL — ABNORMAL HIGH (ref 65–99)

## 2015-06-03 LAB — BASIC METABOLIC PANEL
Anion gap: 9 (ref 5–15)
BUN: 12 mg/dL (ref 6–20)
CALCIUM: 8.9 mg/dL (ref 8.9–10.3)
CHLORIDE: 100 mmol/L — AB (ref 101–111)
CO2: 26 mmol/L (ref 22–32)
CREATININE: 0.81 mg/dL (ref 0.61–1.24)
GFR calc Af Amer: >60 mL/min (ref >60)
Glucose, Bld: 149 mg/dL — ABNORMAL HIGH (ref 65–99)
Potassium: 4 mmol/L (ref 3.5–5.1)
SODIUM: 135 mmol/L (ref 135–145)

## 2015-06-03 MED ORDER — CLINDAMYCIN HCL 300 MG PO CAPS: 600.0000 mg | ORAL_CAPSULE | Freq: Three times a day (TID) | ORAL | Status: DC

## 2015-06-03 NOTE — Discharge Summary (Signed)
Physician Discharge Summary  Nathan Velez J Jeanmarie ZOX:[id number] DOB: 12/22/1965 DOA: 06/01/2015  PCP: Jacquelin HawkingShannon McElroy, PA-C  Admit date: 06/01/2015 Discharge date: 06/03/2015  Time spent: 35 minutes  Recommendations for Outpatient Follow-up:  1. Follow up with primary care physician in 1-2 weeks   Discharge Diagnoses:  Principal Problem:   Cellulitis Active Problems:   HTN (hypertension)   DM type 2 (diabetes mellitus, type 2) (HCC)   Morbid obesity (HCC)   Discharge Condition: improved  Diet recommendation: low salt, low carb  Filed Weights   06/01/15 1834 06/01/15 2336  Weight: 178.717 kg (394 lb) 174.136 kg (383 lb 14.4 oz)    History of present illness:  This patient was admitted to the hospital with left leg cellulitis. The patient had been seen in the emergency room the day prior to admission, received a dose of intravenous antibiotics and was sent home on oral clindamycin. Despite taking these medications, he noticed that his erythema was progressing up his leg. Of note, he did only take 2 doses of clindamycin at home before he returned to the emergency room. He was admitted for further treatments.  Hospital Course:  Patient history the hospital with intravenous vancomycin and clindamycin. He kept his lower extremity elevated. Erythema has nearly resolved. It has markedly retracted from the margins that were marked out on admission. He does have some erythema in his foot, but overall warmth and tenderness has resolved. He can likely complete a course of oral antibiotics at home. He will need another 2 days of oral clindamycin. Patient feels stable for discharge home. The remainder of his medical issues remained stable.  Procedures:    Consultations:    Discharge Exam: Filed Vitals:   06/03/15 0712 06/03/15 1335  BP: 109/65 106/56  Pulse: 81 74  Temp: 97.7 F (36.5 C) 97.8 F (36.6 C)  Resp: 18 18    General: NAD Cardiovascular: s1, s2 rrr Respiratory: cta  b  Discharge Instructions   Discharge Instructions    Diet - low sodium heart healthy    Complete by:  As directed      Increase activity slowly    Complete by:  As directed           Current Discharge Medication List    CONTINUE these medications which have CHANGED   Details  clindamycin (CLEOCIN) 300 MG capsule Take 2 capsules (600 mg total) by mouth 3 (three) times daily. For 2 days Qty: 12 capsule, Refills: 0      CONTINUE these medications which have NOT CHANGED   Details  allopurinol (ZYLOPRIM) 300 MG tablet Take 300 mg by mouth daily.    citalopram (CELEXA) 20 MG tablet Take 20 mg by mouth daily.    furosemide (LASIX) 40 MG tablet Take 40 mg by mouth daily as needed for fluid.    HYDROcodone-acetaminophen (NORCO/VICODIN) 5-325 MG tablet Take 2 tablets by mouth every 4 (four) hours as needed. Qty: 6 tablet, Refills: 0    ibuprofen (ADVIL,MOTRIN) 800 MG tablet Take 1 tablet (800 mg total) by mouth every 8 (eight) hours as needed for mild pain. Qty: 30 tablet, Refills: 0    lisinopril (PRINIVIL,ZESTRIL) 5 MG tablet Take 5 mg by mouth daily.     metFORMIN (GLUCOPHAGE) 1000 MG tablet Take 1,000 mg by mouth 2 (two) times daily with a meal.    metoprolol tartrate (LOPRESSOR) 25 MG tablet Take 25 mg by mouth daily as needed (palpatations).    Neomycin-Bacitracin-Polymyxin (TRIPLE ANTIBIOTIC EX) Apply 1  application topically daily as needed (for legs (skin irritation)).    Omega-3 Fatty Acids (FISH OIL) 1000 MG CAPS Take 2 capsules by mouth daily.    sitaGLIPtin (JANUVIA) 100 MG tablet Take 1 tablet (100 mg total) by mouth daily. Qty: 90 tablet, Refills: 1    vitamin E 600 UNIT capsule Take 1,200 Units by mouth daily.       Allergies  Allergen Reactions  . Penicillins Other (See Comments)    CHILDHOOD ALLERGY Has patient had a PCN reaction causing immediate rash, facial/tongue/throat swelling, SOB or lightheadedness with hypotension: unknown Has patient had a  PCN reaction causing severe rash involving mucus membranes or skin necrosis:  unknown Has patient had a PCN reaction that required hospitalization:  no Has patient had a PCN reaction occurring within the last 10 years: no If all of the above answers are "NO", then may proceed with Ceph      The results of significant diagnostics from this hospitalization (including imaging, microbiology, ancillary and laboratory) are listed below for reference.    Significant Diagnostic Studies: No results found.  Microbiology: No results found for this or any previous visit (from the past 240 hour(s)).   Labs: Basic Metabolic Panel:  Recent Labs Lab 05/31/15 2335 06/01/15 2030 06/02/15 0518 06/03/15 0520  NA 133* 136 134* 135  K 3.9 4.4 3.7 4.0  CL 100* 96* 99* 100*  CO2 GLUCOSE 121* 135* 131* 149*  BUN CREATININE 0.61 0.80 0.75 0.81  CALCIUM 9.2 9.8 9.0 8.9   Liver Function Tests:  Recent Labs Lab 06/02/15 0518  AST 18  ALT 19  ALKPHOS 46  BILITOT 2.2*  PROT 7.5  ALBUMIN 4.0   No results for input(s): LIPASE, AMYLASE in the last 168 hours. No results for input(s): AMMONIA in the last 168 hours. CBC:  Recent Labs Lab 05/31/15 2335 06/01/15 2030 06/02/15 0518  WBC 13.2* 9.8 9.8  NEUTROABS 10.2* 6.7  --   HGB 14.3 14.8 14.0  HCT 42.2 43.4 41.5  MCV 89.6 89.5 89.8  PLT 215 208 211   Cardiac Enzymes: No results for input(s): CKTOTAL, CKMB, CKMBINDEX, TROPONINI in the last 168 hours. BNP: BNP (last 3 results) No results for input(s): BNP in the last 8760 hours.  ProBNP (last 3 results) No results for input(s): PROBNP in the last 8760 hours.  CBG:  Recent Labs Lab 06/02/15 1652 06/02/15 2114 06/03/15 0731 06/03/15 1121 06/03/15 1628  GLUCAP 168* 139* 166* 155* 144*       Signed:  Trezure Cronk MD.  [facility] 06/03/2015, 5:04 PM

## 2015-06-03 NOTE — Progress Notes (Signed)
Patient with orders to be discharge home. Discharge instructions given, patient verbalized understanding. Prescription given. Patient taking last dose of IV antibiotic prior to discharge.

## 2015-06-04 NOTE — Discharge Summary (Signed)
Patient was admitted on 05/09/15 for lower extremity cellulitis. Unfortunately he left the hospital AGAINST MEDICAL ADVICE that same day. No prescriptions were given at time of discharge.  Peggye PittEstela Hernandez, MD [facility] Pager: [phone]

## 2015-06-10 ENCOUNTER — Encounter: Payer: Self-pay | Admitting: Physician Assistant

## 2015-06-10 ENCOUNTER — Ambulatory Visit: Payer: Self-pay | Admitting: Physician Assistant

## 2015-06-10 VITALS — BP 122/76 | HR 91 | Temp 97.5°F | Ht 69.25 in | Wt 387.2 lb

## 2015-06-10 DIAGNOSIS — E1165 Type 2 diabetes mellitus with hyperglycemia: Secondary | ICD-10-CM

## 2015-06-10 DIAGNOSIS — I87303 Chronic venous hypertension (idiopathic) without complications of bilateral lower extremity: Secondary | ICD-10-CM

## 2015-06-10 DIAGNOSIS — E118 Type 2 diabetes mellitus with unspecified complications: Secondary | ICD-10-CM

## 2015-06-10 DIAGNOSIS — I10 Essential (primary) hypertension: Secondary | ICD-10-CM

## 2015-06-10 NOTE — Patient Instructions (Addendum)
Stop metoprolol Take lisinopril every day Get fasting labs/bloodwork drawn

## 2015-06-10 NOTE — Progress Notes (Signed)
BP 122/76 mmHg  Pulse 91  Temp(Src) 97.5 F (36.4 C)  Ht 5' 9.25" (1.759 m)  Wt 387 lb 3.2 oz (175.633 kg)  BMI 56.76 kg/m2  SpO2 98%   Subjective:    Patient ID: Nathan Velez, male    DOB: 06/21/1965, 50 y.o.   MRN: [id number]  HPI: Nathan GritBasilakis J Files is a 50 y.o. male presenting on 06/10/2015 for Follow-up   HPI  Pt is taking his lisinopril and metoprolol prn.  Pt check his bp like 4 times day.  He also checks his bs 4-5 times/day.  This am it was 112.    Pt denies fevers.    Relevant past medical, surgical, family and social history reviewed and updated as indicated. Interim medical history since our last visit reviewed. Allergies and medications reviewed and updated.  Current outpatient prescriptions:  .  allopurinol (ZYLOPRIM) 300 MG tablet, Take 300 mg by mouth daily., Disp: , Rfl:  .  citalopram (CELEXA) 20 MG tablet, Take 20 mg by mouth daily., Disp: , Rfl:  .  furosemide (LASIX) 40 MG tablet, Take 40 mg by mouth daily as needed for fluid., Disp: , Rfl:  .  ibuprofen (ADVIL,MOTRIN) 800 MG tablet, Take 1 tablet (800 mg total) by mouth every 8 (eight) hours as needed for mild pain., Disp: 30 tablet, Rfl: 0 .  lisinopril (PRINIVIL,ZESTRIL) 5 MG tablet, Take 5 mg by mouth daily. Reported on 06/10/2015, Disp: , Rfl:  .  metFORMIN (GLUCOPHAGE) 1000 MG tablet, Take 1,000 mg by mouth 2 (two) times daily with a meal., Disp: , Rfl:  .  metoprolol tartrate (LOPRESSOR) 25 MG tablet, Take 25 mg by mouth daily as needed (palpatations)., Disp: , Rfl:  .  Neomycin-Bacitracin-Polymyxin (TRIPLE ANTIBIOTIC EX), Apply 1 application topically daily as needed (for legs (skin irritation))., Disp: , Rfl:  .  Omega-3 Fatty Acids (FISH OIL) 1000 MG CAPS, Take 2 capsules by mouth daily., Disp: , Rfl:  .  potassium chloride (K-DUR,KLOR-CON) 10 MEQ tablet, Take 10 mEq by mouth once as needed., Disp: , Rfl:  .  sitaGLIPtin (JANUVIA) 100 MG tablet, Take 1 tablet (100 mg total) by mouth daily.,  Disp: 90 tablet, Rfl: 1 .  vitamin E 600 UNIT capsule, Take 1,200 Units by mouth daily., Disp: , Rfl:    Review of Systems  Constitutional: Positive for appetite change and fatigue. Negative for fever, chills, diaphoresis and unexpected weight change.  HENT: Positive for dental problem. Negative for congestion, drooling, ear pain, facial swelling, hearing loss, mouth sores, sneezing, sore throat, trouble swallowing and voice change.   Eyes: Negative for pain, discharge, redness, itching and visual disturbance.  Respiratory: Negative for cough, choking, shortness of breath and wheezing.   Cardiovascular: Positive for leg swelling. Negative for chest pain and palpitations.  Gastrointestinal: Negative for vomiting, abdominal pain, diarrhea, constipation and blood in stool.  Endocrine: Negative for cold intolerance, heat intolerance and polydipsia.  Genitourinary: Negative for dysuria, hematuria and decreased urine volume.  Musculoskeletal: Positive for back pain, arthralgias and gait problem.  Skin: Negative for rash.  Allergic/Immunologic: Positive for environmental allergies.  Neurological: Negative for seizures, syncope, light-headedness and headaches.  Hematological: Negative for adenopathy.  Psychiatric/Behavioral: Positive for dysphoric mood. Negative for suicidal ideas and agitation. The patient is nervous/anxious.     Per HPI unless specifically indicated above     Objective:    BP 122/76 mmHg  Pulse 91  Temp(Src) 97.5 F (36.4 C)  Ht 5' 9.25" (1.759 m)  Wt 387 lb 3.2 oz (175.633 kg)  BMI 56.76 kg/m2  SpO2 98%  Wt Readings from Last 3 Encounters:  06/10/15 387 lb 3.2 oz (175.633 kg)  06/01/15 383 lb 14.4 oz (174.136 kg)  05/31/15 394 lb 8 oz (178.944 kg)    Physical Exam  Constitutional: He is oriented to person, place, and time. He appears well-developed and well-nourished.  HENT:  Head: Normocephalic and atraumatic.  Neck: Neck supple.  Cardiovascular: Normal rate  and regular rhythm.   Pulmonary/Chest: Effort normal and breath sounds normal. He has no wheezes.  Musculoskeletal: He exhibits edema (trace BLE, L>R).  Lymphadenopathy:    He has no cervical adenopathy.  Neurological: He is alert and oriented to person, place, and time.  Skin: Skin is warm and dry.  BLE hyperpigmented, L>R  Psychiatric: He has a normal mood and affect. His behavior is normal.  Vitals reviewed.       Assessment & Plan:   Encounter Diagnoses  Name Primary?  . Stasis edema, bilateral Yes  . Uncontrolled type 2 diabetes mellitus with complication, unspecified long term insulin use status (HCC)   . Essential hypertension   . Morbid obesity, unspecified obesity type (HCC)     -Continue metformin and januvia for total 3 months then recheck a1c.  Sounds like it is working well for him.  Discussed with pt that there is no reason at all for him to check his bs 4 or 5 times every day.  He doesn't need to even check it once every day unless he feels that it is helping him monitor his eating. -Stop metoprolol -Take lisinopril every day.  Discussed with pt to stop taking his blood pressure 4 times daily.  If he wants to check it, do it once/day and no more. -since pt has not gotten new pt labs drawn yet (ordered 05/25/15), he will need to get those drawn and f/u in 2 weeks as scheduled -he is counseled that the best thing to help his legs is to lose weight.  Recommended regular exercise, walking is best.  Exercise will help with wt loss as well as keeping muscles in legs which will help with the edema directly.  When he is seated, he should continue to elevate the legs.

## 2015-06-20 ENCOUNTER — Encounter (HOSPITAL_COMMUNITY): Payer: Self-pay | Admitting: *Deleted

## 2015-06-20 ENCOUNTER — Inpatient Hospital Stay (HOSPITAL_COMMUNITY)
Admission: EM | Admit: 2015-06-20 | Discharge: 2015-06-23 | DRG: 603 | Disposition: A | Discharge status: Home / Self Care | Payer: Self-pay | Attending: Internal Medicine | Admitting: Internal Medicine

## 2015-06-20 DIAGNOSIS — Z8262 Family history of osteoporosis: Secondary | ICD-10-CM

## 2015-06-20 DIAGNOSIS — E785 Hyperlipidemia, unspecified: Secondary | ICD-10-CM | POA: Diagnosis present

## 2015-06-20 DIAGNOSIS — Z6841 Body Mass Index (BMI) 40.0 and over, adult: Secondary | ICD-10-CM

## 2015-06-20 DIAGNOSIS — IMO0002 Reserved for concepts with insufficient information to code with codable children: Secondary | ICD-10-CM | POA: Diagnosis present

## 2015-06-20 DIAGNOSIS — L0390 Cellulitis, unspecified: Secondary | ICD-10-CM | POA: Diagnosis present

## 2015-06-20 DIAGNOSIS — E119 Type 2 diabetes mellitus without complications: Secondary | ICD-10-CM

## 2015-06-20 DIAGNOSIS — E7800 Pure hypercholesterolemia, unspecified: Secondary | ICD-10-CM | POA: Diagnosis present

## 2015-06-20 DIAGNOSIS — Z8639 Personal history of other endocrine, nutritional and metabolic disease: Secondary | ICD-10-CM

## 2015-06-20 DIAGNOSIS — E118 Type 2 diabetes mellitus with unspecified complications: Secondary | ICD-10-CM

## 2015-06-20 DIAGNOSIS — F4310 Post-traumatic stress disorder, unspecified: Secondary | ICD-10-CM | POA: Diagnosis present

## 2015-06-20 DIAGNOSIS — D72829 Elevated white blood cell count, unspecified: Secondary | ICD-10-CM

## 2015-06-20 DIAGNOSIS — L03115 Cellulitis of right lower limb: Principal | ICD-10-CM | POA: Diagnosis present

## 2015-06-20 DIAGNOSIS — Z8249 Family history of ischemic heart disease and other diseases of the circulatory system: Secondary | ICD-10-CM

## 2015-06-20 DIAGNOSIS — E1165 Type 2 diabetes mellitus with hyperglycemia: Secondary | ICD-10-CM | POA: Diagnosis present

## 2015-06-20 DIAGNOSIS — Z7984 Long term (current) use of oral hypoglycemic drugs: Secondary | ICD-10-CM

## 2015-06-20 DIAGNOSIS — F410 Panic disorder [episodic paroxysmal anxiety] without agoraphobia: Secondary | ICD-10-CM | POA: Diagnosis present

## 2015-06-20 DIAGNOSIS — I10 Essential (primary) hypertension: Secondary | ICD-10-CM | POA: Diagnosis present

## 2015-06-20 LAB — CBC
HCT: 39.1 % (ref 39.0–52.0)
HEMATOCRIT: 41 % (ref 39.0–52.0)
HEMOGLOBIN: 14.2 g/dL (ref 13.0–17.0)
HEMOGLOBIN: 13.1 g/dL (ref 13.0–17.0)
MCH: 30.6 pg (ref 26.0–34.0)
MCH: 29.7 pg (ref 26.0–34.0)
MCHC: 34.6 g/dL (ref 30.0–36.0)
MCHC: 33.5 g/dL (ref 30.0–36.0)
MCV: 88.4 fL (ref 78.0–100.0)
MCV: 88.7 fL (ref 78.0–100.0)
Platelets: 249 10*3/uL (ref 150–400)
Platelets: 208 10*3/uL (ref 150–400)
RBC: 4.64 MIL/uL (ref 4.22–5.81)
RBC: 4.41 MIL/uL (ref 4.22–5.81)
RDW: 13.5 % (ref 11.5–15.5)
RDW: 13.6 % (ref 11.5–15.5)
WBC: 17.9 10*3/uL — ABNORMAL HIGH (ref 4.0–10.5)
WBC: 12.2 10*3/uL — ABNORMAL HIGH (ref 4.0–10.5)

## 2015-06-20 LAB — GLUCOSE, CAPILLARY: GLUCOSE-CAPILLARY: 233 mg/dL — AB (ref 65–99)

## 2015-06-20 LAB — BASIC METABOLIC PANEL
Anion gap: 11 (ref 5–15)
BUN: 10 mg/dL (ref 6–20)
CHLORIDE: 94 mmol/L — AB (ref 101–111)
CO2: 23 mmol/L (ref 22–32)
Calcium: 8.8 mg/dL — ABNORMAL LOW (ref 8.9–10.3)
Creatinine, Ser: 0.89 mg/dL (ref 0.61–1.24)
GFR calc non Af Amer: >60 mL/min (ref >60)
Glucose, Bld: 178 mg/dL — ABNORMAL HIGH (ref 65–99)
POTASSIUM: 3.6 mmol/L (ref 3.5–5.1)
SODIUM: 128 mmol/L — AB (ref 135–145)

## 2015-06-20 LAB — CREATININE, SERUM
CREATININE: 0.94 mg/dL (ref 0.61–1.24)
GFR calc Af Amer: >60 mL/min (ref >60)

## 2015-06-20 MED ORDER — VANCOMYCIN HCL IN DEXTROSE 1-5 GM/200ML-% IV SOLN
1000.0000 mg | Freq: Once | INTRAVENOUS | Status: AC
  Administered 2015-06-20: 1000 mg via INTRAVENOUS
  Filled 2015-06-20: qty 200

## 2015-06-20 MED ORDER — SODIUM CHLORIDE 0.9% FLUSH: 3.0000 mL | INTRAVENOUS | Status: DC | PRN

## 2015-06-20 MED ORDER — CIPROFLOXACIN IN D5W 400 MG/200ML IV SOLN
400.0000 mg | Freq: Once | INTRAVENOUS | Status: AC
  Administered 2015-06-20: 400 mg via INTRAVENOUS
  Filled 2015-06-20: qty 200

## 2015-06-20 MED ORDER — LINAGLIPTIN 5 MG PO TABS
5.0000 mg | ORAL_TABLET | Freq: Every day | ORAL | Status: DC
  Administered 2015-06-20 – 2015-06-23 (×4): 5 mg via ORAL
  Filled 2015-06-20 (×4): qty 1

## 2015-06-20 MED ORDER — SODIUM CHLORIDE 0.9% FLUSH
3.0000 mL | Freq: Two times a day (BID) | INTRAVENOUS | Status: DC
  Administered 2015-06-20 – 2015-06-23 (×5): 3 mL via INTRAVENOUS

## 2015-06-20 MED ORDER — SODIUM CHLORIDE 0.9 % IV BOLUS (SEPSIS)
1000.0000 mL | Freq: Once | INTRAVENOUS | Status: AC
  Administered 2015-06-20: 1000 mL via INTRAVENOUS

## 2015-06-20 MED ORDER — LISINOPRIL 5 MG PO TABS
5.0000 mg | ORAL_TABLET | Freq: Every day | ORAL | Status: DC
  Administered 2015-06-20 – 2015-06-23 (×4): 5 mg via ORAL
  Filled 2015-06-20 (×4): qty 1

## 2015-06-20 MED ORDER — ACETAMINOPHEN 325 MG PO TABS
650.0000 mg | ORAL_TABLET | Freq: Four times a day (QID) | ORAL | Status: DC | PRN
Start: 2015-06-20 — End: 2015-06-23
  Administered 2015-06-20: 650 mg via ORAL
  Filled 2015-06-20: qty 2

## 2015-06-20 MED ORDER — ALLOPURINOL 300 MG PO TABS
300.0000 mg | ORAL_TABLET | Freq: Every day | ORAL | Status: DC
  Administered 2015-06-20 – 2015-06-23 (×4): 300 mg via ORAL
  Filled 2015-06-20 (×4): qty 1

## 2015-06-20 MED ORDER — ENOXAPARIN SODIUM 40 MG/0.4ML ~~LOC~~ SOLN
40.0000 mg | SUBCUTANEOUS | Status: DC
  Administered 2015-06-20: 40 mg via SUBCUTANEOUS
  Filled 2015-06-20: qty 0.4

## 2015-06-20 MED ORDER — CLINDAMYCIN PHOSPHATE 600 MG/50ML IV SOLN
INTRAVENOUS | Status: AC
  Filled 2015-06-20: qty 100

## 2015-06-20 MED ORDER — INSULIN ASPART 100 UNIT/ML ~~LOC~~ SOLN
6.0000 [IU] | Freq: Three times a day (TID) | SUBCUTANEOUS | Status: DC
  Administered 2015-06-21 – 2015-06-23 (×8): 6 [IU] via SUBCUTANEOUS

## 2015-06-20 MED ORDER — OMEGA-3-ACID ETHYL ESTERS 1 G PO CAPS
2.0000 g | ORAL_CAPSULE | Freq: Every day | ORAL | Status: DC
  Administered 2015-06-20 – 2015-06-23 (×4): 2 g via ORAL
  Filled 2015-06-20 (×4): qty 2

## 2015-06-20 MED ORDER — SODIUM CHLORIDE 0.9 % IV SOLN: 250.0000 mL | INTRAVENOUS | Status: DC | PRN

## 2015-06-20 MED ORDER — ONDANSETRON HCL 4 MG PO TABS: 4.0000 mg | ORAL_TABLET | Freq: Four times a day (QID) | ORAL | Status: DC | PRN

## 2015-06-20 MED ORDER — TRAMADOL HCL 50 MG PO TABS
50.0000 mg | ORAL_TABLET | Freq: Four times a day (QID) | ORAL | Status: DC | PRN
  Administered 2015-06-20: 50 mg via ORAL
  Filled 2015-06-20: qty 1

## 2015-06-20 MED ORDER — ONDANSETRON HCL 4 MG/2ML IJ SOLN: 4.0000 mg | Freq: Four times a day (QID) | INTRAMUSCULAR | Status: DC | PRN

## 2015-06-20 MED ORDER — ACETAMINOPHEN 650 MG RE SUPP: 650.0000 mg | Freq: Four times a day (QID) | RECTAL | Status: DC | PRN

## 2015-06-20 MED ORDER — CLINDAMYCIN PHOSPHATE 600 MG/50ML IV SOLN
600.0000 mg | Freq: Three times a day (TID) | INTRAVENOUS | Status: DC
  Administered 2015-06-20 – 2015-06-23 (×9): 600 mg via INTRAVENOUS
  Filled 2015-06-20 (×12): qty 50

## 2015-06-20 MED ORDER — INSULIN ASPART 100 UNIT/ML ~~LOC~~ SOLN
0.0000 [IU] | Freq: Every day | SUBCUTANEOUS | Status: DC
  Administered 2015-06-20: 2 [IU] via SUBCUTANEOUS

## 2015-06-20 MED ORDER — SENNOSIDES-DOCUSATE SODIUM 8.6-50 MG PO TABS: 1.0000 | ORAL_TABLET | Freq: Every evening | ORAL | Status: DC | PRN

## 2015-06-20 MED ORDER — CITALOPRAM HYDROBROMIDE 20 MG PO TABS
20.0000 mg | ORAL_TABLET | Freq: Every day | ORAL | Status: DC
  Administered 2015-06-20 – 2015-06-23 (×4): 20 mg via ORAL
  Filled 2015-06-20 (×4): qty 1

## 2015-06-20 MED ORDER — INSULIN ASPART 100 UNIT/ML ~~LOC~~ SOLN
0.0000 [IU] | Freq: Three times a day (TID) | SUBCUTANEOUS | Status: DC
  Administered 2015-06-21: 4 [IU] via SUBCUTANEOUS
  Administered 2015-06-21: 3 [IU] via SUBCUTANEOUS
  Administered 2015-06-21: 4 [IU] via SUBCUTANEOUS
  Administered 2015-06-22: 3 [IU] via SUBCUTANEOUS
  Administered 2015-06-22 – 2015-06-23 (×3): 4 [IU] via SUBCUTANEOUS
  Administered 2015-06-23: 3 [IU] via SUBCUTANEOUS

## 2015-06-20 NOTE — ED Provider Notes (Addendum)
CSN: [id number]     Arrival date & time 06/20/15  0430 History   First MD Initiated Contact with Patient 06/20/15 0440     Chief Complaint  Patient presents with  . Cellulitis     (Consider location/radiation/quality/duration/timing/severity/associated sxs/prior Treatment) The history is provided by the patient.  Patient with hx diabetes, hx cellulitis left leg, htn, presents c/o erythema to right lower leg for the past couple days.  States he had a couple cracks in skin of right foot, between toes, and thinks that may be where it started. States hx same, recurrent cellulitis, several times in left lower leg.  Subjective fevers/chills. Denies nausea/vomiting. No weak/faintness. Normal appetite. States blood sugars have been well controlled. Compliant w normal meds. States had several left over clindamycin pills which he took yesterday, states took 600 mg 4x yesterday, and since then the redness seems to have spread upwards.     Past Medical History  Diagnosis Date  . Diabetes mellitus without complication (HCC)   . Hypertension   . Hypercholesteremia   . Gout   . Bursitis   . Cellulitis     L Leg  . Panic attacks   . Sleep apnea   . Edema   . Tendinitis   . Murmur, heart   . IBS (irritable bowel syndrome)   . PTSD (post-traumatic stress disorder)   . Depression    Past Surgical History  Procedure Laterality Date  . Abscess drainage     Family History  Problem Relation Age of Onset  . Osteoporosis Mother   . Heart disease Mother   . Hypertension Mother   . Hyperlipidemia Mother   . Anemia Mother    Social History  Substance Use Topics  . Smoking status: Never Smoker   . Smokeless tobacco: Never Used  . Alcohol Use: No    Review of Systems  Constitutional: Positive for fever and chills.  HENT: Negative for sore throat.   Eyes: Negative for visual disturbance.  Respiratory: Negative for cough and shortness of breath.   Cardiovascular: Negative for chest pain.   Gastrointestinal: Negative for vomiting, abdominal pain and diarrhea.  Endocrine: Negative for polyuria.  Genitourinary: Negative for dysuria and flank pain.  Musculoskeletal: Negative for back pain and neck pain.  Skin: Negative for wound.  Neurological: Negative for weakness, numbness and headaches.  Hematological: Does not bruise/bleed easily.  Psychiatric/Behavioral: Negative for confusion.      Allergies  Penicillins  Home Medications   Prior to Admission medications   Medication Sig Start Date End Date Taking? Authorizing Provider  allopurinol (ZYLOPRIM) 300 MG tablet Take 300 mg by mouth daily.    Historical Provider, MD  citalopram (CELEXA) 20 MG tablet Take 20 mg by mouth daily.    Historical Provider, MD  furosemide (LASIX) 40 MG tablet Take 40 mg by mouth daily as needed for fluid.    Historical Provider, MD  ibuprofen (ADVIL,MOTRIN) 800 MG tablet Take 1 tablet (800 mg total) by mouth every 8 (eight) hours as needed for mild pain. 05/08/15   Kristen N Ward, DO  lisinopril (PRINIVIL,ZESTRIL) 5 MG tablet Take 5 mg by mouth daily. Reported on 06/10/2015    Historical Provider, MD  metFORMIN (GLUCOPHAGE) 1000 MG tablet Take 1,000 mg by mouth 2 (two) times daily with a meal.    Historical Provider, MD  metoprolol tartrate (LOPRESSOR) 25 MG tablet Take 25 mg by mouth daily as needed (palpatations).    Historical Provider, MD  Neomycin-Bacitracin-Polymyxin (TRIPLE ANTIBIOTIC  EX) Apply 1 application topically daily as needed (for legs (skin irritation)).    Historical Provider, MD  Omega-3 Fatty Acids (FISH OIL) 1000 MG CAPS Take 2 capsules by mouth daily.    Historical Provider, MD  potassium chloride (K-DUR,KLOR-CON) 10 MEQ tablet Take 10 mEq by mouth once as needed.    Historical Provider, MD  sitaGLIPtin (JANUVIA) 100 MG tablet Take 1 tablet (100 mg total) by mouth daily. 06/01/15   Jacquelin Hawking, PA-C  vitamin E 600 UNIT capsule Take 1,200 Units by mouth daily.    Historical  Provider, MD   BP 139/75 mmHg  Pulse 102  Temp(Src) 98.2 F (36.8 C) (Oral)  Resp 20  Ht  (1.753 m)  Wt 178.717 kg  BMI 58.16 kg/m2  SpO2 96% Physical Exam  Constitutional: He appears well-developed and well-nourished. No distress.  HENT:  Mouth/Throat: Oropharynx is clear and moist.  Eyes: Conjunctivae are normal. No scleral icterus.  Neck: Neck supple. No tracheal deviation present.  Cardiovascular: Normal rate, regular rhythm, normal heart sounds and intact distal pulses.   Pulmonary/Chest: Effort normal and breath sounds normal. No accessory muscle usage. No respiratory distress.  Abdominal: Soft. Bowel sounds are normal. He exhibits no distension. There is no tenderness.  obese  Genitourinary:  No cva tenderness  Musculoskeletal: Normal range of motion.  Cellulitis of left lower leg.  Patient w superficial crack in skin x 2, of right foot, between toes, no purulent drainage from areas.  Dp/pt 2+. No calf pain or tenderness. No lymphangitis.   Neurological: He is alert.  Skin: Skin is warm and dry. He is not diaphoretic.  Psychiatric: He has a normal mood and affect.  Nursing note and vitals reviewed.   ED Course  Procedures (including critical care time) Labs Review   Results for orders placed or performed during the hospital encounter of 06/20/15  CBC  Result Value Ref Range   WBC 17.9 (H) 4.0 - 10.5 K/uL   RBC 4.64 4.22 - 5.81 MIL/uL   Hemoglobin 14.2 13.0 - 17.0 g/dL   HCT 16.1 09.6 - 04.5 %   MCV 88.4 78.0 - 100.0 fL   MCH 30.6 26.0 - 34.0 pg   MCHC 34.6 30.0 - 36.0 g/dL   RDW 40.9 81.1 - 91.4 %   Platelets 249 150 - 400 K/uL  Basic metabolic panel  Result Value Ref Range   Sodium 128 (L) 135 - 145 mmol/L   Potassium 3.6 3.5 - 5.1 mmol/L   Chloride 94 (L) 101 - 111 mmol/L   CO2 23 22 - 32 mmol/L   Glucose, Bld 178 (H) 65 - 99 mg/dL   BUN 10 6 - 20 mg/dL   Creatinine, Ser 7.82 0.61 - 1.24 mg/dL   Calcium 8.8 (L) 8.9 - 10.3 mg/dL   GFR calc non Af  Amer >60 >60 mL/min   GFR calc Af Amer >60 >60 mL/min   Anion gap 11 5 - 15     I have personally reviewed and evaluated these lab results as part of my medical decision-making.    MDM   Iv ns. Labs.  Patient with hx diabetes and cellulitis. cipro iv, vanc iv.   Reviewed nursing notes and prior charts for additional history.   Given that patient indicates symptoms worse despite over  2 gm clindamycin over course of past 24 hours, will admit to medical service.  Hospitalist consulted for admission, obs status.  Cathren LaineKevin Renton Berkley, MD 06/20/15 [phone]

## 2015-06-20 NOTE — H&P (Signed)
History and Physical    Nathan Velez JYN:[id number] DOB: 27-Nov-1965 DOA: 06/20/2015  Referring MD/NP/PA: Eber Hong, EDP PCP: Jacquelin Hawking, PA-C  Patient coming from: Home  Chief Complaint: Right lower extremity cellulitis  HPI: Nathan Velez is a 50 y.o. male with history of hypertension, hyperlipidemia, type 2 diabetes, morbid obesity presents to the hospital with right lower extremity cellulitis. He states the day prior to admission he noted a small cut under his fifth toe. 12 hours later he noticed redness that spread up to mid shin on the right. He decided to come to the emergency department where he was prescribed clindamycin. He took clindamycin for a full day and because he did not have improvement was sent to the hospital for further evaluation where admission has been requested. He does mention that he has been having fevers a high is 101.8 at home with chills.  Past Medical/Surgical History: Past Medical History  Diagnosis Date  . Diabetes mellitus without complication (HCC)   . Hypertension   . Hypercholesteremia   . Gout   . Bursitis   . Cellulitis     L Leg  . Panic attacks   . Sleep apnea   . Edema   . Tendinitis   . Murmur, heart   . IBS (irritable bowel syndrome)   . PTSD (post-traumatic stress disorder)   . Depression     Past Surgical History  Procedure Laterality Date  . Abscess drainage       reports that he has never smoked. He has never used smokeless tobacco. He reports that he does not drink alcohol or use illicit drugs.  Allergies: Allergies  Allergen Reactions  . Penicillins Other (See Comments)    CHILDHOOD ALLERGY Has patient had a PCN reaction causing immediate rash, facial/tongue/throat swelling, SOB or lightheadedness with hypotension: unknown Has patient had a PCN reaction causing severe rash involving mucus membranes or skin necrosis:  unknown Has patient had a PCN reaction that required hospitalization:  no Has patient  had a PCN reaction occurring within the last 10 years: no If all of the above answers are "NO", then may proceed with Ceph    Family History:  Family History  Problem Relation Age of Onset  . Osteoporosis Mother   . Heart disease Mother   . Hypertension Mother   . Hyperlipidemia Mother   . Anemia Mother     Prior to Admission medications   Medication Sig Start Date End Date Taking? Authorizing Provider  allopurinol (ZYLOPRIM) 300 MG tablet Take 300 mg by mouth daily.   Yes Historical Provider, MD  citalopram (CELEXA) 20 MG tablet Take 20 mg by mouth daily.   Yes Historical Provider, MD  furosemide (LASIX) 40 MG tablet Take 40 mg by mouth daily as needed for fluid.   Yes Historical Provider, MD  ibuprofen (ADVIL,MOTRIN) 800 MG tablet Take 1 tablet (800 mg total) by mouth every 8 (eight) hours as needed for mild pain. 05/08/15  Yes Kristen N Ward, DO  lisinopril (PRINIVIL,ZESTRIL) 5 MG tablet Take 5 mg by mouth daily. Reported on 06/10/2015   Yes Historical Provider, MD  metFORMIN (GLUCOPHAGE) 1000 MG tablet Take 1,000 mg by mouth 2 (two) times daily with a meal.   Yes Historical Provider, MD  Neomycin-Bacitracin-Polymyxin (TRIPLE ANTIBIOTIC EX) Apply 1 application topically daily as needed (for legs (skin irritation)).   Yes Historical Provider, MD  Omega-3 Fatty Acids (FISH OIL) 1000 MG CAPS Take 2 capsules by mouth daily.  Yes Historical Provider, MD  sitaGLIPtin (JANUVIA) 100 MG tablet Take 1 tablet (100 mg total) by mouth daily. 06/01/15  Yes Jacquelin Hawking, PA-C  vitamin E 600 UNIT capsule Take 1,200 Units by mouth daily.   Yes Historical Provider, MD  potassium chloride (K-DUR,KLOR-CON) 10 MEQ tablet Take 10 mEq by mouth once as needed.    Historical Provider, MD    Review of Systems:  Constitutional: Denies appetite change and fatigue.  HEENT: Denies photophobia, eye pain, redness, hearing loss, ear pain, congestion, sore throat, rhinorrhea, sneezing, mouth sores, trouble  swallowing, neck pain, neck stiffness and tinnitus.   Respiratory: Denies SOB, DOE, cough, chest tightness,  and wheezing.   Cardiovascular: Denies chest pain, palpitations and leg swelling.  Gastrointestinal: Denies nausea, vomiting, abdominal pain, diarrhea, constipation, blood in stool and abdominal distention.  Genitourinary: Denies dysuria, urgency, frequency, hematuria, flank pain and difficulty urinating.  Endocrine: Denies: hot or cold intolerance, sweats, changes in hair or nails, polyuria, polydipsia. Musculoskeletal: Denies myalgias, back pain, joint swelling, arthralgias and gait problem.  Skin: Denies pallor, rash and wound.  Neurological: Denies dizziness, seizures, syncope, weakness, light-headedness, numbness and headaches.  Hematological: Denies adenopathy. Easy bruising, personal or family bleeding history  Psychiatric/Behavioral: Denies suicidal ideation, mood changes, confusion, nervousness, sleep disturbance and agitation    Physical Exam: Filed Vitals:   06/20/15 0441 06/20/15 0710 06/20/15 0959 06/20/15 1221  BP: 139/75 115/61 107/65 97/63  Pulse: 102 85 88 82  Temp: 98.2 F (36.8 C) 98.9 F (37.2 C) 98.3 F (36.8 C) 98.4 F (36.9 C)  TempSrc: Oral Oral Oral Oral  Resp: Height:  (1.753 m)    (1.753 m)  Weight: 178.717 kg (394 lb)   174.3 kg (384 lb 4.2 oz)  SpO2: 96% 98% 94% 92%     Constitutional: NAD, calm, comfortable,Morbidly obese Eyes: PERRL, lids and conjunctivae normal ENMT: Mucous membranes are moist. Posterior pharynx clear of any exudate or lesions.Normal dentition.  Neck: normal, supple, no masses, no thyromegaly Respiratory: clear to auscultation bilaterally, no wheezing, no crackles. Normal respiratory effort. No accessory muscle use.  Cardiovascular: Regular rate and rhythm, no murmurs / rubs / gallops. No extremity edema. 2+ pedal pulses. No carotid bruits.  Abdomen: no tenderness, no masses palpated. No  hepatosplenomegaly. Bowel sounds positive.  Musculoskeletal: Right lower extremity:    Skin: no rashes, lesions, ulcers. No induration Neurologic: CN 2-12 grossly intact. Sensation intact, DTR normal. Strength 5/5 in all 4.  Psychiatric: Normal judgment and insight. Alert and oriented x 3. Normal mood.    Labs on Admission: I have personally reviewed the following labs and imaging studies  CBC:  Recent Labs Lab 06/20/15 0500  WBC 17.9*  HGB 14.2  HCT 41.0  MCV 88.4  PLT 249   Basic Metabolic Panel:  Recent Labs Lab 06/20/15 0500  NA 128*  K 3.6  CL 94*  CO2 23  GLUCOSE 178*  BUN 10  CREATININE 0.89  CALCIUM 8.8*   GFR: Estimated Creatinine Clearance: 159.2 mL/min (by C-G formula based on Cr of 0.89). Liver Function Tests: No results for input(s): AST, ALT, ALKPHOS, BILITOT, PROT, ALBUMIN in the last 168 hours. No results for input(s): LIPASE, AMYLASE in the last 168 hours. No results for input(s): AMMONIA in the last 168 hours. Coagulation Profile: No results for input(s): INR, PROTIME in the last 168 hours. Cardiac Enzymes: No results for input(s): CKTOTAL, CKMB, CKMBINDEX, TROPONINI in the last 168 hours. BNP (last  3 results) No results for input(s): PROBNP in the last 8760 hours. HbA1C: No results for input(s): HGBA1C in the last 72 hours. CBG: No results for input(s): GLUCAP in the last 168 hours. Lipid Profile: No results for input(s): CHOL, HDL, LDLCALC, TRIG, CHOLHDL, LDLDIRECT in the last 72 hours. Thyroid Function Tests: No results for input(s): TSH, T4TOTAL, FREET4, T3FREE, THYROIDAB in the last 72 hours. Anemia Panel: No results for input(s): VITAMINB12, FOLATE, FERRITIN, TIBC, IRON, RETICCTPCT in the last 72 hours. Urine analysis: No results found for: COLORURINE, APPEARANCEUR, LABSPEC, PHURINE, GLUCOSEU, HGBUR, BILIRUBINUR, KETONESUR, PROTEINUR, UROBILINOGEN, NITRITE, LEUKOCYTESUR Sepsis Labs: @LABRCNTIP (procalcitonin:4,lacticidven:4) )No  results found for this or any previous visit (from the past 240 hour(s)).   Radiological Exams on Admission: No results found.  EKG: Independently reviewed. None obtained in the ED  Assessment/Plan Principal Problem:   Cellulitis Active Problems:   HTN (hypertension)   DM type 2 (diabetes mellitus, type 2) (HCC)   Morbid obesity (HCC)   Uncontrolled type 2 diabetes mellitus with complication (HCC)   Hyperlipidemia    Right lower extremity cellulitis -Likely secondary infection from small cut on fifth toe.  -do not believe he has failed outpatient treatment with clindamycin as he only received it for 24 hours.  -will admit on IV clindamycin, check blood cultures, anticipate discharge home in 24-48 hours with improvement.  Type 2 diabetes -Check hemoglobin A1c, continue Januvia, placed on a resistant sliding scale.  Morbid obesity -Noted  Hypertension -Continue home medications   DVT prophylaxis: Lovenox  Code Status: Full code  Family Communication: Patient only  Disposition Plan: Likely home in 24-48 hours  Consults called: None  Admission status: Observation    Time Spent: 65 minutes  Chaya JanHERNANDEZ ACOSTA,ESTELA MD [facility] Pager [phone]  If 7PM-7AM, please contact night-coverage [email or web address] Password [email or web address]  06/20/2015, 5:42 PM

## 2015-06-20 NOTE — ED Provider Notes (Signed)
D/w Dr. Ardyth HarpsHernandez - grateful for consult They agree to admit to obs Holding orders written  Eber HongBrian Abiha Lukehart, MD 06/20/15 [id number]

## 2015-06-20 NOTE — ED Notes (Signed)
Gave pt urinal, needs to void

## 2015-06-20 NOTE — ED Notes (Signed)
Pt c/o pain, redness and swelling to right lower leg; pt states the symptoms began x 1 day ago

## 2015-06-20 NOTE — ED Notes (Signed)
Called floor. Unable to take report at this time

## 2015-06-21 LAB — CBC
HEMATOCRIT: 42 % (ref 39.0–52.0)
HEMOGLOBIN: 14 g/dL (ref 13.0–17.0)
MCH: 29.7 pg (ref 26.0–34.0)
MCHC: 33.3 g/dL (ref 30.0–36.0)
MCV: 89.2 fL (ref 78.0–100.0)
Platelets: 225 10*3/uL (ref 150–400)
RBC: 4.71 MIL/uL (ref 4.22–5.81)
RDW: 13.6 % (ref 11.5–15.5)
WBC: 15.4 10*3/uL — ABNORMAL HIGH (ref 4.0–10.5)

## 2015-06-21 LAB — GLUCOSE, CAPILLARY
GLUCOSE-CAPILLARY: 168 mg/dL — AB (ref 65–99)
GLUCOSE-CAPILLARY: 169 mg/dL — AB (ref 65–99)
Glucose-Capillary: 151 mg/dL — ABNORMAL HIGH (ref 65–99)
Glucose-Capillary: 144 mg/dL — ABNORMAL HIGH (ref 65–99)

## 2015-06-21 LAB — BASIC METABOLIC PANEL
ANION GAP: 9 (ref 5–15)
BUN: 11 mg/dL (ref 6–20)
CHLORIDE: 99 mmol/L — AB (ref 101–111)
CO2: 24 mmol/L (ref 22–32)
Calcium: 8.8 mg/dL — ABNORMAL LOW (ref 8.9–10.3)
Creatinine, Ser: 0.86 mg/dL (ref 0.61–1.24)
GFR calc non Af Amer: >60 mL/min (ref >60)
GLUCOSE: 154 mg/dL — AB (ref 65–99)
POTASSIUM: 3.9 mmol/L (ref 3.5–5.1)
Sodium: 132 mmol/L — ABNORMAL LOW (ref 135–145)

## 2015-06-21 MED ORDER — ENOXAPARIN SODIUM 80 MG/0.8ML ~~LOC~~ SOLN
80.0000 mg | SUBCUTANEOUS | Status: DC
  Administered 2015-06-21 – 2015-06-22 (×2): 80 mg via SUBCUTANEOUS
  Filled 2015-06-21 (×2): qty 0.8

## 2015-06-21 MED ORDER — HYDROCODONE-ACETAMINOPHEN 5-325 MG PO TABS
1.0000 | ORAL_TABLET | Freq: Four times a day (QID) | ORAL | Status: DC | PRN
  Administered 2015-06-21 – 2015-06-22 (×5): 2 via ORAL
  Filled 2015-06-21 (×5): qty 2

## 2015-06-21 NOTE — Progress Notes (Signed)
PROGRESS NOTE    Nathan Velez  NWG:[id number] DOB: 1965/02/22 DOA: 06/20/2015 PCP: Jacquelin Hawking, PA-C     Brief Narrative:  50 year old man admitted on 6/3 for right lower extremity cellulitis.   Assessment & Plan:   Principal Problem:   Cellulitis Active Problems:   HTN (hypertension)   DM type 2 (diabetes mellitus, type 2) (HCC)   Morbid obesity (HCC)   Uncontrolled type 2 diabetes mellitus with complication (HCC)   Hyperlipidemia   Right lower extremity cellulitis -Likely secondary infection from small cut on the fifth toe. -Continue IV clindamycin, blood cultures remain negative. -I do not appreciate any significant improvement or worsening on exam today.  Type 2 diabetes -Fair control, monitor CBGs and adjust insulin regimen as needed.  Morbid obesity -Noted  Hypertension -Well-controlled    DVT prophylaxis: Lovenox Code Status: Full code Family Communication: Patient only Disposition Plan: To be determined  Consultants:   None  Procedures:   None  Antimicrobials:   Clindamycin    Subjective: Has significant pain to his right leg feels like redness and swelling is worse  Objective: Filed Vitals:   06/20/15 1221 06/20/15 2119 06/20/15 2219 06/21/15 0701  BP: 97/63 158/121 113/61 105/57  Pulse: 82 74 71 80  Temp: 98.4 F (36.9 C) 98.1 F (36.7 C)  98.7 F (37.1 C)  TempSrc: Oral Oral  Oral  Resp: Height:  (1.753 m)     Weight: 174.3 kg (384 lb 4.2 oz)     SpO2: 92% 98%  97%    Intake/Output Summary (Last 24 hours) at 06/21/15 1216 Last data filed at 06/21/15 0800  Gross per 24 hour  Intake    360 ml  Output    650 ml  Net   -290 ml   Filed Weights   06/20/15 0441 06/20/15 1221  Weight: 178.717 kg (394 lb) 174.3 kg (384 lb 4.2 oz)    Examination:  General exam: Alert, awake, oriented x 3 Respiratory system: Clear to auscultation. Respiratory effort normal. Cardiovascular system:RRR. No murmurs, rubs,  gallops. Gastrointestinal system: Abdomen is nondistended, soft and nontender. No organomegaly or masses felt. Normal bowel sounds heard. Central nervous system: Alert and oriented. No focal neurological deficits. Extremities: Right:     Skin: No rashes, lesions or ulcers Psychiatry: Judgement and insight appear normal. Mood & affect appropriate.     Data Reviewed: I have personally reviewed following labs and imaging studies  CBC:  Recent Labs Lab 06/20/15 0500 06/20/15 1820 06/21/15 0604  WBC 17.9* 12.2* 15.4*  HGB 14.2 13.1 14.0  HCT 41.0 39.1 42.0  MCV 88.4 88.7 89.2  PLT 249 208 225   Basic Metabolic Panel:  Recent Labs Lab 06/20/15 0500 06/20/15 1820 06/21/15 0604  NA 128*  --  132*  K 3.6  --  3.9  CL 94*  --  99*  CO2 23  --  24  GLUCOSE 178*  --  154*  BUN 10  --  11  CREATININE 0.89 0.94 0.86  CALCIUM 8.8*  --  8.8*   GFR: Estimated Creatinine Clearance: 164.7 mL/min (by C-G formula based on Cr of 0.86). Liver Function Tests: No results for input(s): AST, ALT, ALKPHOS, BILITOT, PROT, ALBUMIN in the last 168 hours. No results for input(s): LIPASE, AMYLASE in the last 168 hours. No results for input(s): AMMONIA in the last 168 hours. Coagulation Profile: No results for input(s): INR, PROTIME in the last 168 hours. Cardiac Enzymes: No  results for input(s): CKTOTAL, CKMB, CKMBINDEX, TROPONINI in the last 168 hours. BNP (last 3 results) No results for input(s): PROBNP in the last 8760 hours. HbA1C: No results for input(s): HGBA1C in the last 72 hours. CBG:  Recent Labs Lab 06/20/15 2122 06/21/15 0717 06/21/15 1121  GLUCAP 233* 168* 151*   Lipid Profile: No results for input(s): CHOL, HDL, LDLCALC, TRIG, CHOLHDL, LDLDIRECT in the last 72 hours. Thyroid Function Tests: No results for input(s): TSH, T4TOTAL, FREET4, T3FREE, THYROIDAB in the last 72 hours. Anemia Panel: No results for input(s): VITAMINB12, FOLATE, FERRITIN, TIBC, IRON,  RETICCTPCT in the last 72 hours. Urine analysis: No results found for: COLORURINE, APPEARANCEUR, LABSPEC, PHURINE, GLUCOSEU, HGBUR, BILIRUBINUR, KETONESUR, PROTEINUR, UROBILINOGEN, NITRITE, LEUKOCYTESUR Sepsis Labs: @LABRCNTIP (procalcitonin:4,lacticidven:4)  ) Recent Results (from the past 240 hour(s))  Culture, blood (routine x 2)     Status: None (Preliminary result)   Collection Time: 06/20/15  6:09 PM  Result Value Ref Range Status   Specimen Description BLOOD RIGHT HAND  Final   Special Requests BOTTLES DRAWN AEROBIC AND ANAEROBIC 4CC EACH  Final   Culture NO GROWTH < 12 HOURS  Final   Report Status PENDING  Incomplete  Culture, blood (routine x 2)     Status: None (Preliminary result)   Collection Time: 06/20/15  6:20 PM  Result Value Ref Range Status   Specimen Description LEFT ANTECUBITAL  Final   Special Requests BOTTLES DRAWN AEROBIC AND ANAEROBIC 6CC EACH  Final   Culture NO GROWTH < 12 HOURS  Final   Report Status PENDING  Incomplete         Radiology Studies: No results found.      Scheduled Meds: . allopurinol  300 mg Oral Daily  . citalopram  20 mg Oral Daily  . clindamycin (CLEOCIN) IV  600 mg Intravenous Q8H  . enoxaparin (LOVENOX) injection  40 mg Subcutaneous Q24H  . insulin aspart  0-20 Units Subcutaneous TID WC  . insulin aspart  0-5 Units Subcutaneous QHS  . insulin aspart  6 Units Subcutaneous TID WC  . linagliptin  5 mg Oral Daily  . lisinopril  5 mg Oral Daily  . omega-3 acid ethyl esters  2 g Oral Daily  . sodium chloride flush  3 mL Intravenous Q12H   Continuous Infusions:       Time spent: 25 minutes. Greater than 50% of this time was spent in direct contact with the patient coordinating care.     Chaya JanHERNANDEZ ACOSTA,ESTELA, MD [facility] Pager [phone]  If 7PM-7AM, please contact night-coverage [email or web address] Password [email or web address] 06/21/2015, 12:16 PM

## 2015-06-21 NOTE — Progress Notes (Signed)
Patient has had c/o pain, paged on call mid-level MD and had tramadol ordered. Patients states tramadol is ineffective for pain, paged on call MD to get something else ordered, will follow any new orders received and continue to monitor the patient.

## 2015-06-22 DIAGNOSIS — L03116 Cellulitis of left lower limb: Secondary | ICD-10-CM

## 2015-06-22 LAB — BASIC METABOLIC PANEL
Anion gap: 8 (ref 5–15)
BUN: 18 mg/dL (ref 6–20)
CALCIUM: 8.3 mg/dL — AB (ref 8.9–10.3)
CO2: 26 mmol/L (ref 22–32)
CREATININE: 0.95 mg/dL (ref 0.61–1.24)
Chloride: 98 mmol/L — ABNORMAL LOW (ref 101–111)
GFR calc non Af Amer: >60 mL/min (ref >60)
Glucose, Bld: 161 mg/dL — ABNORMAL HIGH (ref 65–99)
Potassium: 3.8 mmol/L (ref 3.5–5.1)
SODIUM: 132 mmol/L — AB (ref 135–145)

## 2015-06-22 LAB — CBC
HCT: 36.3 % — ABNORMAL LOW (ref 39.0–52.0)
Hemoglobin: 11.9 g/dL — ABNORMAL LOW (ref 13.0–17.0)
MCH: 29.2 pg (ref 26.0–34.0)
MCHC: 32.8 g/dL (ref 30.0–36.0)
MCV: 89 fL (ref 78.0–100.0)
PLATELETS: 197 10*3/uL (ref 150–400)
RBC: 4.08 MIL/uL — AB (ref 4.22–5.81)
RDW: 13.6 % (ref 11.5–15.5)
WBC: 7.8 10*3/uL (ref 4.0–10.5)

## 2015-06-22 LAB — GLUCOSE, CAPILLARY
GLUCOSE-CAPILLARY: 151 mg/dL — AB (ref 65–99)
GLUCOSE-CAPILLARY: 194 mg/dL — AB (ref 65–99)
Glucose-Capillary: 161 mg/dL — ABNORMAL HIGH (ref 65–99)
Glucose-Capillary: 132 mg/dL — ABNORMAL HIGH (ref 65–99)

## 2015-06-22 LAB — HIV ANTIBODY (ROUTINE TESTING W REFLEX): HIV Screen 4th Generation wRfx: NONREACTIVE

## 2015-06-22 LAB — HEMOGLOBIN A1C
Hgb A1c MFr Bld: 6.9 % — ABNORMAL HIGH (ref 4.8–5.6)
Mean Plasma Glucose: 151 mg/dL

## 2015-06-22 NOTE — Care Management Note (Signed)
Case Management Note  Patient Details  Name: Nathan Velez MRN: [id number] Date of Birth: 12/21/1965  Subjective/Objective: Patient is from home, lives with his mother. He is independent with ADL's. He has a cane. He does not have insurance, [facility] and they also help him with medication assistance. Patient is able to drive himself to appointments.   Patient will return home at discharge. He has questions about his bills from hospital, will consult financial counselor to see if any help is available.           Action/Plan: No CM needs at this time, will follow.     Expected Discharge Date:  06/30/15               Expected Discharge Plan:  Home/Self Care  In-House Referral:     Discharge planning Services  CM Consult  Post Acute Care Choice:  NA Choice offered to:  NA  DME Arranged:    DME Agency:     HH Arranged:    HH Agency:     Status of Service:  In process, will continue to follow  Medicare Important Message Given:    Date Medicare IM Given:    Medicare IM give by:    Date Additional Medicare IM Given:    Additional Medicare Important Message give by:     If discussed at Long Length of Stay Meetings, dates discussed:    Additional Comments:  Nathan Velez, Nathan OilerSharley Diane, RN 06/22/2015, 2:36 PM

## 2015-06-22 NOTE — Progress Notes (Addendum)
PROGRESS NOTE    Nathan Velez  ZOX:[id number] DOB: 08/11/1965 DOA: 06/20/2015 PCP: Jacquelin Hawking, PA-C     Brief Narrative:  50 year old man admitted on 6/3 for right lower extremity cellulitis.   Assessment & Plan:   Principal Problem:   Cellulitis Active Problems:   HTN (hypertension)   DM type 2 (diabetes mellitus, type 2) (HCC)   Morbid obesity (HCC)   Uncontrolled type 2 diabetes mellitus with complication (HCC)   Hyperlipidemia   Right lower extremity cellulitis -Likely secondary infection from small cut on the fifth toe. -Continue IV clindamycin, blood cultures remain negative. -Appears improved, will give another 24 hours and IV antibiotics.  Type 2 diabetes -Fair control, monitor CBGs and adjust insulin regimen as needed.  Morbid obesity -Noted  Hypertension -Well-controlled    DVT prophylaxis: Lovenox Code Status: Full code Family Communication: Patient only Disposition Plan: To be determined  Consultants:   None  Procedures:   None  Antimicrobials:   Clindamycin    Subjective: Has significant pain to his right leg feels like redness and swelling is worse  Objective: Filed Vitals:   06/21/15 2301 06/22/15 0617 06/22/15 1020 06/22/15 1455  BP: 97/67 99/58 109/62 104/57  Pulse: 60 65 71 68  Temp: 97.5 F (36.4 C) 97.8 F (36.6 C)  97.5 F (36.4 C)  TempSrc: Oral Oral  Oral  Resp: Height:      Weight:  171.777 kg (378 lb 11.2 oz)    SpO2: 94% 93%  99%    Intake/Output Summary (Last 24 hours) at 06/22/15 1558 Last data filed at 06/22/15 1300  Gross per 24 hour  Intake    720 ml  Output    351 ml  Net    369 ml   Filed Weights   06/20/15 0441 06/20/15 1221 06/22/15 0617  Weight: 178.717 kg (394 lb) 174.3 kg (384 lb 4.2 oz) 171.777 kg (378 lb 11.2 oz)    Examination:  General exam: Alert, awake, oriented x 3 Respiratory system: Clear to auscultation. Respiratory effort normal. Cardiovascular  system:RRR. No murmurs, rubs, gallops. Gastrointestinal system: Abdomen is nondistended, soft and nontender. No organomegaly or masses felt. Normal bowel sounds heard. Central nervous system: Alert and oriented. No focal neurological deficits. Extremities: Right:        Skin: No rashes, lesions or ulcers Psychiatry: Judgement and insight appear normal. Mood & affect appropriate.     Data Reviewed: I have personally reviewed following labs and imaging studies  CBC:  Recent Labs Lab 06/20/15 0500 06/20/15 1820 06/21/15 0604 06/22/15 0636  WBC 17.9* 12.2* 15.4* 7.8  HGB 14.2 13.1 14.0 11.9*  HCT 41.0 39.1 42.0 36.3*  MCV 88.4 88.7 89.2 89.0  PLT 249 208 225 197   Basic Metabolic Panel:  Recent Labs Lab 06/20/15 0500 06/20/15 1820 06/21/15 0604 06/22/15 0636  NA 128*  --  132* 132*  K 3.6  --  3.9 3.8  CL 94*  --  99* 98*  CO2 23  --  24 26  GLUCOSE 178*  --  154* 161*  BUN 10  --  11 18  CREATININE 0.89 0.94 0.86 0.95  CALCIUM 8.8*  --  8.8* 8.3*   GFR: Estimated Creatinine Clearance: 147.8 mL/min (by C-G formula based on Cr of 0.95). Liver Function Tests: No results for input(s): AST, ALT, ALKPHOS, BILITOT, PROT, ALBUMIN in the last 168 hours. No results for input(s): LIPASE, AMYLASE in the last 168 hours.  No results for input(s): AMMONIA in the last 168 hours. Coagulation Profile: No results for input(s): INR, PROTIME in the last 168 hours. Cardiac Enzymes: No results for input(s): CKTOTAL, CKMB, CKMBINDEX, TROPONINI in the last 168 hours. BNP (last 3 results) No results for input(s): PROBNP in the last 8760 hours. HbA1C:  Recent Labs  06/20/15 1820  HGBA1C 6.9*   CBG:  Recent Labs Lab 06/21/15 1121 06/21/15 1633 06/21/15 2043 06/22/15 0744 06/22/15 1116  GLUCAP 151* 144* 169* 151* 161*   Lipid Profile: No results for input(s): CHOL, HDL, LDLCALC, TRIG, CHOLHDL, LDLDIRECT in the last 72 hours. Thyroid Function Tests: No results for  input(s): TSH, T4TOTAL, FREET4, T3FREE, THYROIDAB in the last 72 hours. Anemia Panel: No results for input(s): VITAMINB12, FOLATE, FERRITIN, TIBC, IRON, RETICCTPCT in the last 72 hours. Urine analysis: No results found for: COLORURINE, APPEARANCEUR, LABSPEC, PHURINE, GLUCOSEU, HGBUR, BILIRUBINUR, KETONESUR, PROTEINUR, UROBILINOGEN, NITRITE, LEUKOCYTESUR Sepsis Labs: @LABRCNTIP (procalcitonin:4,lacticidven:4)  ) Recent Results (from the past 240 hour(s))  Culture, blood (routine x 2)     Status: None (Preliminary result)   Collection Time: 06/20/15  6:09 PM  Result Value Ref Range Status   Specimen Description BLOOD RIGHT HAND  Final   Special Requests BOTTLES DRAWN AEROBIC AND ANAEROBIC 4CC EACH  Final   Culture NO GROWTH 2 DAYS  Final   Report Status PENDING  Incomplete  Culture, blood (routine x 2)     Status: None (Preliminary result)   Collection Time: 06/20/15  6:20 PM  Result Value Ref Range Status   Specimen Description LEFT ANTECUBITAL  Final   Special Requests BOTTLES DRAWN AEROBIC AND ANAEROBIC 6CC EACH  Final   Culture NO GROWTH 2 DAYS  Final   Report Status PENDING  Incomplete         Radiology Studies: No results found.      Scheduled Meds: . allopurinol  300 mg Oral Daily  . citalopram  20 mg Oral Daily  . clindamycin (CLEOCIN) IV  600 mg Intravenous Q8H  . enoxaparin (LOVENOX) injection  80 mg Subcutaneous Q24H  . insulin aspart  0-20 Units Subcutaneous TID WC  . insulin aspart  0-5 Units Subcutaneous QHS  . insulin aspart  6 Units Subcutaneous TID WC  . linagliptin  5 mg Oral Daily  . lisinopril  5 mg Oral Daily  . omega-3 acid ethyl esters  2 g Oral Daily  . sodium chloride flush  3 mL Intravenous Q12H   Continuous Infusions:    LOS: 1 day    Time spent: 25 minutes. Greater than 50% of this time was spent in direct contact with the patient coordinating care.     Chaya JanHERNANDEZ ACOSTA,ESTELA, MD [facility] Pager [phone]  If  7PM-7AM, please contact night-coverage [email or web address] Password [email or web address] 06/22/2015, 3:58 PM

## 2015-06-23 LAB — GLUCOSE, CAPILLARY
GLUCOSE-CAPILLARY: 147 mg/dL — AB (ref 65–99)
Glucose-Capillary: 160 mg/dL — ABNORMAL HIGH (ref 65–99)

## 2015-06-23 MED ORDER — CLINDAMYCIN HCL 300 MG PO CAPS: 300.0000 mg | ORAL_CAPSULE | Freq: Three times a day (TID) | ORAL | Status: DC

## 2015-06-23 NOTE — Care Management Note (Signed)
Case Management Note  Patient Details  Name: Serita GritBasilakis J Krol MRN: [id number] Date of Birth: 01/17/1965  :   Expected Discharge Date:  06/30/15               Expected Discharge Plan:  Home/Self Care  In-House Referral:     Discharge planning Services  CM Consult  Post Acute Care Choice:  NA Choice offered to:  NA  DME Arranged:    DME Agency:     HH Arranged:    HH Agency:     Status of Service:  In process, will continue to follow  Medicare Important Message Given:    Date Medicare IM Given:    Medicare IM give by:    Date Additional Medicare IM Given:    Additional Medicare Important Message give by:     If discussed at Long Length of Stay Meetings, dates discussed:    Additional Comments: Patient discharging home today with self care. No CM needs.   Ramelo Oetken, Chrystine OilerSharley Diane, RN 06/23/2015, 2:34 PM

## 2015-06-23 NOTE — Progress Notes (Signed)
Patient is alert and oriented, vital signs are stable, discharge instructions reviewed with patient, patient instructed to follow up with PA, prescription given, questions and concerns answered Stanford BreedBracey, Bradly Sangiovanni N 1:54 PM 06-23-2015

## 2015-06-23 NOTE — Discharge Summary (Signed)
Physician Discharge Summary  Nathan Velez RUE:[id number] DOB: 1965-04-04 DOA: 06/20/2015  PCP: Jacquelin Hawking, PA-C  Admit date: 06/20/2015 Discharge date: 06/23/2015  Time spent: 45 minutes  Recommendations for Outpatient Follow-up:  -We'll be discharged home today. -Advised to follow-up with primary care provider in 2 weeks. -Has 7 days of clindamycin remaining.   Discharge Diagnoses:  Principal Problem:   Cellulitis Active Problems:   HTN (hypertension)   DM type 2 (diabetes mellitus, type 2) (HCC)   Morbid obesity (HCC)   Uncontrolled type 2 diabetes mellitus with complication (HCC)   Hyperlipidemia   Discharge Condition: Stable and improved  Filed Weights   06/20/15 0441 06/20/15 1221 06/22/15 0617  Weight: 178.717 kg (394 lb) 174.3 kg (384 lb 4.2 oz) 171.777 kg (378 lb 11.2 oz)    History of present illness:  Nathan Velez is a 50 y.o. male with history of hypertension, hyperlipidemia, type 2 diabetes, morbid obesity presents to the hospital with right lower extremity cellulitis. He states the day prior to admission he noted a small cut under his fifth toe. 12 hours later he noticed redness that spread up to mid shin on the right. He decided to come to the emergency department where he was prescribed clindamycin. He took clindamycin for a full day and because he did not have improvement was sent to the hospital for further evaluation where admission has been requested. He does mention that he has been having fevers a high is 101.8 at home with chills.  Hospital Course:   Right lower extremity cellulitis -Likely secondary to infection from small cut on the fifth toe. -Blood cultures remain negative. -Since had good response in the hospital to IV clindamycin, will transition oral clindamycin to complete an extra 7 days of treatment.  Type 2 diabetes -Fair control.  Morbid obesity -Noted  Hypertension -Well-controlled  Procedures:  None    Consultations:  None  Discharge Instructions  Discharge Instructions    Diet - low sodium heart healthy    Complete by:  As directed      Increase activity slowly    Complete by:  As directed             Medication List    TAKE these medications        allopurinol 300 MG tablet  Commonly known as:  ZYLOPRIM  Take 300 mg by mouth daily.     citalopram 20 MG tablet  Commonly known as:  CELEXA  Take 20 mg by mouth daily.     clindamycin 300 MG capsule  Commonly known as:  CLEOCIN  Take 1 capsule (300 mg total) by mouth 3 (three) times daily.     Fish Oil 1000 MG Caps  Take 2 capsules by mouth daily.     furosemide 40 MG tablet  Commonly known as:  LASIX  Take 40 mg by mouth daily as needed for fluid.     ibuprofen 800 MG tablet  Commonly known as:  ADVIL,MOTRIN  Take 1 tablet (800 mg total) by mouth every 8 (eight) hours as needed for mild pain.     lisinopril 5 MG tablet  Commonly known as:  PRINIVIL,ZESTRIL  Take 5 mg by mouth daily. Reported on 06/10/2015     metFORMIN 1000 MG tablet  Commonly known as:  GLUCOPHAGE  Take 1,000 mg by mouth 2 (two) times daily with a meal.     potassium chloride 10 MEQ tablet  Commonly known as:  K-DUR,KLOR-CON  Take 10 mEq by mouth once as needed.     sitaGLIPtin 100 MG tablet  Commonly known as:  JANUVIA  Take 1 tablet (100 mg total) by mouth daily.     TRIPLE ANTIBIOTIC EX  Apply 1 application topically daily as needed (for legs (skin irritation)).     vitamin E 600 UNIT capsule  Take 1,200 Units by mouth daily.       Allergies  Allergen Reactions  . Penicillins Other (See Comments)    CHILDHOOD ALLERGY Has patient had a PCN reaction causing immediate rash, facial/tongue/throat swelling, SOB or lightheadedness with hypotension: unknown Has patient had a PCN reaction causing severe rash involving mucus membranes or skin necrosis:  unknown Has patient had a PCN reaction that required hospitalization:  no Has  patient had a PCN reaction occurring within the last 10 years: no If all of the above answers are "NO", then may proceed with Ceph       Follow-up Information    Follow up with Jacquelin Hawking, PA-C. Schedule an appointment as soon as possible for a visit in 2 weeks.   Specialty:  Physician Assistant   Contact information:   [address] [place] [address] [phone]        The results of significant diagnostics from this hospitalization (including imaging, microbiology, ancillary and laboratory) are listed below for reference.    Significant Diagnostic Studies: No results found.  Microbiology: Recent Results (from the past 240 hour(s))  Culture, blood (routine x 2)     Status: None (Preliminary result)   Collection Time: 06/20/15  6:09 PM  Result Value Ref Range Status   Specimen Description BLOOD RIGHT HAND  Final   Special Requests BOTTLES DRAWN AEROBIC AND ANAEROBIC 4CC EACH  Final   Culture NO GROWTH 3 DAYS  Final   Report Status PENDING  Incomplete  Culture, blood (routine x 2)     Status: None (Preliminary result)   Collection Time: 06/20/15  6:20 PM  Result Value Ref Range Status   Specimen Description LEFT ANTECUBITAL  Final   Special Requests BOTTLES DRAWN AEROBIC AND ANAEROBIC 6CC EACH  Final   Culture NO GROWTH 3 DAYS  Final   Report Status PENDING  Incomplete     Labs: Basic Metabolic Panel:  Recent Labs Lab 06/20/15 0500 06/20/15 1820 06/21/15 0604 06/22/15 0636  NA 128*  --  132* 132*  K 3.6  --  3.9 3.8  CL 94*  --  99* 98*  CO2 23  --  24 26  GLUCOSE 178*  --  154* 161*  BUN 10  --  11 18  CREATININE 0.89 0.94 0.86 0.95  CALCIUM 8.8*  --  8.8* 8.3*   Liver Function Tests: No results for input(s): AST, ALT, ALKPHOS, BILITOT, PROT, ALBUMIN in the last 168 hours. No results for input(s): LIPASE, AMYLASE in the last 168 hours. No results for input(s): AMMONIA in the last 168 hours. CBC:  Recent Labs Lab 06/20/15 0500  06/20/15 1820 06/21/15 0604 06/22/15 0636  WBC 17.9* 12.2* 15.4* 7.8  HGB 14.2 13.1 14.0 11.9*  HCT 41.0 39.1 42.0 36.3*  MCV 88.4 88.7 89.2 89.0  PLT 249 208 225 197   Cardiac Enzymes: No results for input(s): CKTOTAL, CKMB, CKMBINDEX, TROPONINI in the last 168 hours. BNP: BNP (last 3 results) No results for input(s): BNP in the last 8760 hours.  ProBNP (last 3 results) No results for input(s): PROBNP in the last 8760 hours.  CBG:  Recent Labs Lab 06/22/15 1116 06/22/15 1634 06/22/15 2045 06/23/15 0714 06/23/15 1143  GLUCAP 161* 132* 194* 147* 160*       Signed:  HERNANDEZ ACOSTA,Shamond Skelton  [facility] Pager: [phone] 06/23/2015, 3:31 PM

## 2015-06-25 ENCOUNTER — Ambulatory Visit: Payer: Self-pay | Admitting: Physician Assistant

## 2015-06-25 ENCOUNTER — Encounter: Payer: Self-pay | Admitting: Physician Assistant

## 2015-06-25 ENCOUNTER — Other Ambulatory Visit: Payer: Self-pay | Admitting: Physician Assistant

## 2015-06-25 VITALS — BP 130/76 | HR 77 | Temp 97.5°F | Ht 69.25 in | Wt 374.1 lb

## 2015-06-25 DIAGNOSIS — E1165 Type 2 diabetes mellitus with hyperglycemia: Secondary | ICD-10-CM

## 2015-06-25 DIAGNOSIS — E118 Type 2 diabetes mellitus with unspecified complications: Principal | ICD-10-CM

## 2015-06-25 DIAGNOSIS — L03115 Cellulitis of right lower limb: Secondary | ICD-10-CM

## 2015-06-25 DIAGNOSIS — I87303 Chronic venous hypertension (idiopathic) without complications of bilateral lower extremity: Secondary | ICD-10-CM

## 2015-06-25 LAB — LIPID PANEL
CHOL/HDL RATIO: 6.3 ratio — AB (ref <=5.0)
Cholesterol: 208 mg/dL — ABNORMAL HIGH (ref 125–200)
HDL: 33 mg/dL — ABNORMAL LOW (ref >=40)
LDL Cholesterol: 138 mg/dL — ABNORMAL HIGH (ref <130)
Triglycerides: 183 mg/dL — ABNORMAL HIGH (ref <150)
VLDL: 37 mg/dL — AB (ref <30)

## 2015-06-25 LAB — CULTURE, BLOOD (ROUTINE X 2)
CULTURE: NO GROWTH
CULTURE: NO GROWTH

## 2015-06-25 NOTE — Progress Notes (Signed)
BP 130/76 mmHg  Pulse 77  Temp(Src) 97.5 F (36.4 C)  Ht 5' 9.25" (1.759 m)  Wt 374 lb 1.6 oz (169.691 kg)  BMI 54.84 kg/m2  SpO2 96%   Subjective:    Patient ID: Nathan Velez, male    DOB: 03/01/1965, 50 y.o.   MRN: [id number]  HPI: Nathan Velez is a 50 y.o. male presenting on 06/25/2015 for Diabetes and Cellulitis   HPI   Pt just got labs drawn this morning  He is feeling much better now that his RLE is improving  Pt says he likes [place]- he feels like his appetite has decreased and he feels like he has lost a few pounds  Relevant past medical, surgical, family and social history reviewed and updated as indicated. Interim medical history since our last visit reviewed. Allergies reviewed and updated.  CURRENT MEDICATIONS:    Current outpatient prescriptions:  .  allopurinol (ZYLOPRIM) 300 MG tablet, Take 300 mg by mouth daily., Disp: , Rfl:  .  clindamycin (CLEOCIN) 300 MG capsule, Take 1 capsule (300 mg total) by mouth 3 (three) times daily., Disp: 21 capsule, Rfl: 0 .  metFORMIN (GLUCOPHAGE) 1000 MG tablet, Take 1,000 mg by mouth 2 (two) times daily with a meal., Disp: , Rfl:  .  sitaGLIPtin (JANUVIA) 100 MG tablet, Take 1 tablet (100 mg total) by mouth daily., Disp: 90 tablet, Rfl: 1 .  citalopram (CELEXA) 20 MG tablet, Take 20 mg by mouth daily., Disp: , Rfl:  .  furosemide (LASIX) 40 MG tablet, Take 40 mg by mouth daily as needed for fluid., Disp: , Rfl:  .  ibuprofen (ADVIL,MOTRIN) 800 MG tablet, Take 1 tablet (800 mg total) by mouth every 8 (eight) hours as needed for mild pain., Disp: 30 tablet, Rfl: 0 .  lisinopril (PRINIVIL,ZESTRIL) 5 MG tablet, Take 5 mg by mouth daily. Reported on 06/10/2015, Disp: , Rfl:  .  Neomycin-Bacitracin-Polymyxin (TRIPLE ANTIBIOTIC EX), Apply 1 application topically daily as needed (for legs (skin irritation))., Disp: , Rfl:  .  Omega-3 Fatty Acids (FISH OIL) 1000 MG CAPS, Take 2 capsules by mouth daily., Disp: , Rfl:  .   potassium chloride (K-DUR,KLOR-CON) 10 MEQ tablet, Take 10 mEq by mouth once as needed., Disp: , Rfl:  .  vitamin E 600 UNIT capsule, Take 1,200 Units by mouth daily., Disp: , Rfl:   (not all meds reviewed- EPIC was down and pt only stated taking alllopurinol, clindamycin, metformin, januvia)  Review of Systems  Constitutional: Positive for activity change, fatigue and unexpected weight change. Negative for fever, chills, diaphoresis and appetite change.  HENT: Positive for dental problem. Negative for congestion, drooling, ear pain, facial swelling, hearing loss, mouth sores, sneezing, sore throat, trouble swallowing and voice change.   Eyes: Negative for pain, discharge, redness, itching and visual disturbance.  Respiratory: Negative for cough, choking, shortness of breath and wheezing.   Cardiovascular: Positive for leg swelling. Negative for chest pain and palpitations.  Gastrointestinal: Negative for vomiting, abdominal pain, diarrhea, constipation and blood in stool.  Endocrine: Negative for cold intolerance, heat intolerance and polydipsia.  Genitourinary: Negative for dysuria, hematuria and decreased urine volume.  Musculoskeletal: Negative for back pain, arthralgias and gait problem.  Skin: Negative for rash.  Allergic/Immunologic: Negative for environmental allergies.  Neurological: Negative for seizures, syncope, light-headedness and headaches.  Hematological: Negative for adenopathy.  Psychiatric/Behavioral: Negative for suicidal ideas, dysphoric mood and agitation. The patient is not nervous/anxious.     Per HPI unless  specifically indicated above     Objective:    BP 130/76 mmHg  Pulse 77  Temp(Src) 97.5 F (36.4 C)  Ht 5' 9.25" (1.759 m)  Wt 374 lb 1.6 oz (169.691 kg)  BMI 54.84 kg/m2  SpO2 96%  Wt Readings from Last 3 Encounters:  06/25/15 374 lb 1.6 oz (169.691 kg)  06/22/15 378 lb 11.2 oz (171.777 kg)  06/10/15 387 lb 3.2 oz (175.633 kg)    Physical Exam   Constitutional: He is oriented to person, place, and time. He appears well-developed and well-nourished.  HENT:  Head: Normocephalic and atraumatic.  Neck: Neck supple.  Cardiovascular: Normal rate and regular rhythm.   Pulses:      Dorsalis pedis pulses are 2+ on the right side, and 2+ on the left side.  Pulmonary/Chest: Effort normal and breath sounds normal. He has no wheezes.  Abdominal: Soft. Bowel sounds are normal. There is no hepatosplenomegaly. There is no tenderness.  obese  Musculoskeletal: He exhibits no edema.  Lymphadenopathy:    He has no cervical adenopathy.  Neurological: He is alert and oriented to person, place, and time.  Skin: Skin is warm and dry.  RLE cellulitis improved- redness no longer extends to line drawn on the leg while he was in the hospital.  Redness is only moderately pink.  The skin is warm and dry but not hot- it is same temperature as LLE  Psychiatric: He has a normal mood and affect. His behavior is normal.  Vitals reviewed.       Assessment & Plan:    Encounter Diagnoses  Name Primary?  Marland Kitchen Uncontrolled type 2 diabetes mellitus with complication, unspecified long term insulin use status (HCC) Yes  . Cellulitis of right lower extremity   . Stasis edema, bilateral   . Morbid obesity, unspecified obesity type (HCC)      -continue clindamycin given in hospital -will call with lab results (lipids/microalbumin- ordered 05/25/15) -counseled on weight loss -f/u 3 months. RTO sooner prn

## 2015-06-26 LAB — MICROALBUMIN, URINE: Microalb, Ur: 2.3 mg/dL

## 2015-06-30 ENCOUNTER — Other Ambulatory Visit: Payer: Self-pay | Admitting: Physician Assistant

## 2015-06-30 MED ORDER — LISINOPRIL 5 MG PO TABS: 5.0000 mg | ORAL_TABLET | Freq: Every day | ORAL | Status: DC

## 2015-06-30 MED ORDER — ALLOPURINOL 300 MG PO TABS: 300.0000 mg | ORAL_TABLET | Freq: Every day | ORAL | Status: DC

## 2015-06-30 MED ORDER — METFORMIN HCL 1000 MG PO TABS: 1000.0000 mg | ORAL_TABLET | Freq: Two times a day (BID) | ORAL | Status: DC

## 2015-06-30 MED ORDER — ATORVASTATIN CALCIUM 20 MG PO TABS: 20.0000 mg | ORAL_TABLET | Freq: Every day | ORAL | Status: DC

## 2015-07-06 ENCOUNTER — Other Ambulatory Visit: Payer: Self-pay | Admitting: Physician Assistant

## 2015-07-06 DIAGNOSIS — E785 Hyperlipidemia, unspecified: Secondary | ICD-10-CM

## 2015-07-06 DIAGNOSIS — E1165 Type 2 diabetes mellitus with hyperglycemia: Principal | ICD-10-CM

## 2015-07-06 DIAGNOSIS — IMO0001 Reserved for inherently not codable concepts without codable children: Secondary | ICD-10-CM

## 2015-08-03 ENCOUNTER — Emergency Department (HOSPITAL_COMMUNITY)
Admission: EM | Admit: 2015-08-03 | Discharge: 2015-08-03 | Disposition: A | Discharge status: Home / Self Care | Payer: Self-pay | Attending: Emergency Medicine | Admitting: Emergency Medicine

## 2015-08-03 ENCOUNTER — Encounter (HOSPITAL_COMMUNITY): Payer: Self-pay | Admitting: Emergency Medicine

## 2015-08-03 ENCOUNTER — Telehealth: Payer: Self-pay | Admitting: Physician Assistant

## 2015-08-03 DIAGNOSIS — E119 Type 2 diabetes mellitus without complications: Secondary | ICD-10-CM | POA: Insufficient documentation

## 2015-08-03 DIAGNOSIS — R002 Palpitations: Secondary | ICD-10-CM | POA: Insufficient documentation

## 2015-08-03 DIAGNOSIS — I10 Essential (primary) hypertension: Secondary | ICD-10-CM | POA: Insufficient documentation

## 2015-08-03 DIAGNOSIS — Z7984 Long term (current) use of oral hypoglycemic drugs: Secondary | ICD-10-CM | POA: Insufficient documentation

## 2015-08-03 DIAGNOSIS — Z79899 Other long term (current) drug therapy: Secondary | ICD-10-CM | POA: Insufficient documentation

## 2015-08-03 DIAGNOSIS — Z791 Long term (current) use of non-steroidal anti-inflammatories (NSAID): Secondary | ICD-10-CM | POA: Insufficient documentation

## 2015-08-03 DIAGNOSIS — F329 Major depressive disorder, single episode, unspecified: Secondary | ICD-10-CM | POA: Insufficient documentation

## 2015-08-03 DIAGNOSIS — L03115 Cellulitis of right lower limb: Secondary | ICD-10-CM | POA: Insufficient documentation

## 2015-08-03 LAB — BASIC METABOLIC PANEL
ANION GAP: 13 (ref 5–15)
BUN: 15 mg/dL (ref 6–20)
CALCIUM: 9 mg/dL (ref 8.9–10.3)
CO2: 24 mmol/L (ref 22–32)
CREATININE: 0.93 mg/dL (ref 0.61–1.24)
Chloride: 98 mmol/L — ABNORMAL LOW (ref 101–111)
GLUCOSE: 169 mg/dL — AB (ref 65–99)
Potassium: 4.2 mmol/L (ref 3.5–5.1)
Sodium: 135 mmol/L (ref 135–145)

## 2015-08-03 LAB — CBC WITH DIFFERENTIAL/PLATELET
BASOS ABS: 0 10*3/uL (ref 0.0–0.1)
BASOS PCT: 0 %
EOS PCT: 1 %
Eosinophils Absolute: 0.1 10*3/uL (ref 0.0–0.7)
HCT: 42.7 % (ref 39.0–52.0)
Hemoglobin: 15 g/dL (ref 13.0–17.0)
Lymphocytes Relative: 13 %
Lymphs Abs: 1.8 10*3/uL (ref 0.7–4.0)
MCH: 30.9 pg (ref 26.0–34.0)
MCHC: 35.1 g/dL (ref 30.0–36.0)
MCV: 87.9 fL (ref 78.0–100.0)
MONO ABS: 0.9 10*3/uL (ref 0.1–1.0)
MONOS PCT: 7 %
Neutro Abs: 10.9 10*3/uL — ABNORMAL HIGH (ref 1.7–7.7)
Neutrophils Relative %: 79 %
PLATELETS: 266 10*3/uL (ref 150–400)
RBC: 4.86 MIL/uL (ref 4.22–5.81)
RDW: 13.4 % (ref 11.5–15.5)
WBC: 13.7 10*3/uL — ABNORMAL HIGH (ref 4.0–10.5)

## 2015-08-03 LAB — I-STAT CG4 LACTIC ACID, ED: Lactic Acid, Venous: 2.46 mmol/L (ref 0.5–1.9)

## 2015-08-03 MED ORDER — SULFAMETHOXAZOLE-TRIMETHOPRIM 800-160 MG PO TABS: 1.0000 | ORAL_TABLET | Freq: Two times a day (BID) | ORAL | Status: AC

## 2015-08-03 MED ORDER — SODIUM CHLORIDE 0.9 % IV BOLUS (SEPSIS)
1000.0000 mL | Freq: Once | INTRAVENOUS | Status: AC
  Administered 2015-08-03: 1000 mL via INTRAVENOUS

## 2015-08-03 MED ORDER — SULFAMETHOXAZOLE-TRIMETHOPRIM 800-160 MG PO TABS
1.0000 | ORAL_TABLET | Freq: Once | ORAL | Status: AC
  Administered 2015-08-03: 1 via ORAL
  Filled 2015-08-03: qty 1

## 2015-08-03 MED ORDER — CEPHALEXIN 500 MG PO CAPS: 500.0000 mg | ORAL_CAPSULE | Freq: Three times a day (TID) | ORAL | Status: DC

## 2015-08-03 NOTE — Telephone Encounter (Signed)
Pt. called in stating that his leg was swollen and red and he believed his cellulitis was coming back. Pt. was advised to go to ER due to the fact the [facility] would not be seeing patients until this afternoon and the fact that he had been hospitalized in the past for this issue.

## 2015-08-03 NOTE — ED Provider Notes (Signed)
CSN: [id number]     Arrival date & time 08/03/15  1024 History  By signing my name below, I, Nathan Velez, attest that this documentation has been prepared under the direction and in the presence of Blane OharaJoshua Johnell Bas, MD. Electronically Signed: Rosario AdieWilliam Andrew Velez, ED Scribe. 08/03/2015. 11:57 AM.   Chief Complaint  Patient presents with  . Cellulitis   The history is provided by the patient. No language interpreter was used.   HPI Comments: Nathan Velez is a 50 y.o. male with a PMHx significant for DM, HTN, Gout, Cellulitis, and HLD who presents to the Emergency Department complaining of gradual onset, gradually worsening, 10/10 at it's worst, constant, burning area of redness to his LLE onset ~5.5 hours PTA.He has been diaphoretic, and he has had sensation of palpitation.  Pt reports that it initially was a small area on his calf, but has progressively spread over his shin and calf. Pt has had six episodes of cellulitis to the same area and his right leg over the past seven months.  He has been hospitalized for similar symptoms in the past, his last hospitalization for his cellulitis was ~two months ago for similar symptoms. His blood sugars having been running at ~159. He denies fever or any other symptoms.  Past Medical History  Diagnosis Date  . Diabetes mellitus without complication (HCC)   . Hypertension   . Hypercholesteremia   . Gout   . Bursitis   . Cellulitis     L Leg  . Panic attacks   . Sleep apnea   . Edema   . Tendinitis   . Murmur, heart   . IBS (irritable bowel syndrome)   . PTSD (post-traumatic stress disorder)   . Depression    Past Surgical History  Procedure Laterality Date  . Abscess drainage     Family History  Problem Relation Age of Onset  . Osteoporosis Mother   . Heart disease Mother   . Hypertension Mother   . Hyperlipidemia Mother   . Anemia Mother    Social History  Substance Use Topics  . Smoking status: Never Smoker   .  Smokeless tobacco: Never Used  . Alcohol Use: No    Review of Systems  Constitutional: Positive for diaphoresis. Negative for fever.  Cardiovascular: Positive for palpitations.  Skin: Positive for color change and rash.  All other systems reviewed and are negative.   Allergies  Lisinopril and Penicillins  Home Medications   Prior to Admission medications   Medication Sig Start Date End Date Taking? Authorizing Provider  allopurinol (ZYLOPRIM) 300 MG tablet Take 1 tablet (300 mg total) by mouth daily. 06/30/15  Yes Jacquelin HawkingShannon McElroy, PA-C  atorvastatin (LIPITOR) 20 MG tablet Take 1 tablet (20 mg total) by mouth daily. 06/30/15  Yes Jacquelin HawkingShannon McElroy, PA-C  citalopram (CELEXA) 20 MG tablet Take 20 mg by mouth daily.   Yes Historical Provider, MD  furosemide (LASIX) 40 MG tablet Take 40 mg by mouth daily as needed for fluid.   Yes Historical Provider, MD  metFORMIN (GLUCOPHAGE) 1000 MG tablet Take 1 tablet (1,000 mg total) by mouth 2 (two) times daily with a meal. 06/30/15  Yes Jacquelin HawkingShannon McElroy, PA-C  Neomycin-Bacitracin-Polymyxin (TRIPLE ANTIBIOTIC EX) Apply 1 application topically daily as needed (for legs (skin irritation)).   Yes Historical Provider, MD  Omega-3 Fatty Acids (FISH OIL) 1000 MG CAPS Take 2 capsules by mouth daily.   Yes Historical Provider, MD  potassium chloride (K-DUR,KLOR-CON) 10 MEQ tablet Take  10 mEq by mouth once as needed (takes with lasix.).    Yes Historical Provider, MD  sitaGLIPtin (JANUVIA) 100 MG tablet Take 1 tablet (100 mg total) by mouth daily. 06/01/15  Yes Jacquelin Hawking, PA-C  vitamin E 600 UNIT capsule Take 1,200 Units by mouth daily.   Yes Historical Provider, MD  cephALEXin (KEFLEX) 500 MG capsule Take 1 capsule (500 mg total) by mouth 3 (three) times daily. 08/03/15   Blane Ohara, MD  clindamycin (CLEOCIN) 300 MG capsule Take 1 capsule (300 mg total) by mouth 3 (three) times daily. Patient not taking: Reported on 08/03/2015 06/23/15   Henderson Cloud, MD  ibuprofen (ADVIL,MOTRIN) 800 MG tablet Take 1 tablet (800 mg total) by mouth every 8 (eight) hours as needed for mild pain. Patient not taking: Reported on 08/03/2015 05/08/15   Kristen N Ward, DO  lisinopril (PRINIVIL,ZESTRIL) 5 MG tablet Take 1 tablet (5 mg total) by mouth daily. Patient not taking: Reported on 08/03/2015 06/30/15   Jacquelin Hawking, PA-C  sulfamethoxazole-trimethoprim (BACTRIM DS,SEPTRA DS) 800-160 MG tablet Take 1 tablet by mouth 2 (two) times daily. 08/03/15 08/10/15  Blane Ohara, MD   BP 120/82 mmHg  Pulse 86  Temp(Src) 97.9 F (36.6 C) (Oral)  Resp 18  Ht 5\' 9"  (1.753 m)  Wt 374 lb (169.645 kg)  BMI 55.20 kg/m2  SpO2 96%   Physical Exam  Constitutional: He appears well-developed and well-nourished.  HENT:  Head: Normocephalic.  Eyes: Conjunctivae are normal.  Cardiovascular: Normal rate and normal heart sounds.   Pulmonary/Chest: Effort normal. No respiratory distress.  Abdominal: He exhibits no distension.  Musculoskeletal: Normal range of motion.  Neurological: He is alert.  Skin: Skin is warm and dry. There is erythema.  Area of mild warmth and erythema to the anterior and mid LLE. All compartments are soft. Approximate length is 16cm.   Psychiatric: He has a normal mood and affect. His behavior is normal.  Nursing note and vitals reviewed.  ED Course  Procedures (including critical care time)  DIAGNOSTIC STUDIES: Oxygen Saturation is 96% on RA, normal by my interpretation.   COORDINATION OF CARE: 11:57 AM-Discussed next steps with pt. Pt verbalized understanding and is agreeable with the plan.   Labs Review Labs Reviewed  CBC WITH DIFFERENTIAL/PLATELET - Abnormal; Notable for the following:    WBC 13.7 (*)    Neutro Abs 10.9 (*)    All other components within normal limits  BASIC METABOLIC PANEL - Abnormal; Notable for the following:    Chloride 98 (*)    Glucose, Bld 169 (*)    All other components within normal limits  I-STAT  CG4 LACTIC ACID, ED - Abnormal; Notable for the following:    Lactic Acid, Venous 2.46 (*)    All other components within normal limits    Imaging Review No results found.  I have personally reviewed and evaluated these images and lab results as part of my medical decision-making.  MDM   Final diagnoses:  Cellulitis of right leg   Patient with history of cellulitis presents with similar. Patient is well-appearing smiling in the room. Patient does have clinically cellulitis in the left leg no swelling in the leg. Blood work overall unremarkable with mild white blood cell, elevation. IV fluids given. First dose oral antibiotics and plan for recheck in 48 hours.  Results and differential diagnosis were discussed with the patient/parent/guardian. Xrays were independently reviewed by myself.  Close follow up outpatient was discussed, comfortable with  the plan.   Medications  sulfamethoxazole-trimethoprim (BACTRIM DS,SEPTRA DS) 800-160 MG per tablet 1 tablet (1 tablet Oral Given 08/03/15 1141)  sodium chloride 0.9 % bolus 1,000 mL (1,000 mLs Intravenous New Bag/Given 08/03/15 1308)    Filed Vitals:   08/03/15 1034  BP: 120/82  Pulse: 86  Temp: 97.9 F (36.6 C)  TempSrc: Oral  Resp: 18  Height:  (1.753 m)  Weight: 374 lb (169.645 kg)  SpO2: 96%    Final diagnoses:  Cellulitis of right leg      Blane Ohara, MD 08/03/15 1440

## 2015-08-03 NOTE — ED Notes (Signed)
Patient complaining of redness and "warm feeling" to lower left extremity starting at 0430 today. States this is 5th visit for same.

## 2015-08-03 NOTE — Discharge Instructions (Signed)
Take antibiotics as directed. Return for persistent fevers, rapidly spreading redness or other concerns.  If you were given medicines take as directed.  If you are on coumadin or contraceptives realize their levels and effectiveness is altered by many different medicines.  If you have any reaction (rash, tongues swelling, other) to the medicines stop taking and see a physician.    If your blood pressure was elevated in the ER make sure you follow up for management with a primary doctor or return for chest pain, shortness of breath or stroke symptoms.  Please follow up as directed and return to the ER or see a physician for new or worsening symptoms.  Thank you. Filed Vitals:   08/03/15 1034  BP: 120/82  Pulse: 86  Temp: 97.9 F (36.6 C)  TempSrc: Oral  Resp: 18  Height: 5\' 9"  (1.753 m)  Weight: 374 lb (169.645 kg)  SpO2: 96%

## 2015-08-12 ENCOUNTER — Ambulatory Visit: Payer: Self-pay | Admitting: Physician Assistant

## 2015-08-12 ENCOUNTER — Encounter: Payer: Self-pay | Admitting: Physician Assistant

## 2015-08-12 VITALS — BP 132/86 | HR 79 | Temp 97.5°F | Ht 69.75 in | Wt 358.4 lb

## 2015-08-12 DIAGNOSIS — I10 Essential (primary) hypertension: Secondary | ICD-10-CM

## 2015-08-12 MED ORDER — LOSARTAN POTASSIUM 50 MG PO TABS: 50.0000 mg | ORAL_TABLET | Freq: Every day | ORAL | 3 refills | Status: DC

## 2015-08-12 NOTE — Progress Notes (Signed)
BP 132/86 (BP Location: Left Arm, Patient Position: Sitting, Cuff Size: Large)   Pulse 79   Temp 97.5 F (36.4 C) (Other (Comment))   Ht 5' 9.75" (1.772 m)   Wt (!) 358 lb 6.4 oz (162.6 kg)   SpO2 98%   BMI 51.79 kg/m    Subjective:    Patient ID: Nathan Velez, male    DOB: [birth year], 50 y.o.   MRN: [id number]  HPI: Nathan Velez is a 50 y.o. male presenting on 08/12/2015 for Follow-up (from ER)   HPI  Chief Complaint  Patient presents with  . Follow-up    from ER    -pt states that His cellulitis is all gone.  He completed the antibiotics that he was given -Pt stopped his lisinopril due to cough  Relevant past medical, surgical, family and social history reviewed and updated as indicated. Interim medical history since our last visit reviewed. Allergies and medications reviewed and updated.   Current Outpatient Prescriptions:  .  allopurinol (ZYLOPRIM) 300 MG tablet, Take 1 tablet (300 mg total) by mouth daily., Disp: 90 tablet, Rfl: 0 .  atorvastatin (LIPITOR) 20 MG tablet, Take 1 tablet (20 mg total) by mouth daily., Disp: 90 tablet, Rfl: 1 .  citalopram (CELEXA) 20 MG tablet, Take 20 mg by mouth daily., Disp: , Rfl:  .  furosemide (LASIX) 40 MG tablet, Take 40 mg by mouth daily as needed for fluid., Disp: , Rfl:  .  ibuprofen (ADVIL,MOTRIN) 800 MG tablet, Take 1 tablet (800 mg total) by mouth every 8 (eight) hours as needed for mild pain., Disp: 30 tablet, Rfl: 0 .  metFORMIN (GLUCOPHAGE) 1000 MG tablet, Take 1 tablet (1,000 mg total) by mouth 2 (two) times daily with a meal., Disp: 180 tablet, Rfl: 1 .  Omega-3 Fatty Acids (FISH OIL) 1000 MG CAPS, Take 2 capsules by mouth daily., Disp: , Rfl:  .  potassium chloride (K-DUR,KLOR-CON) 10 MEQ tablet, Take 10 mEq by mouth once as needed (takes with lasix.). , Disp: , Rfl:  .  sitaGLIPtin (JANUVIA) 100 MG tablet, Take 1 tablet (100 mg total) by mouth daily., Disp: 90 tablet, Rfl: 1 .  vitamin E 600 UNIT capsule,  Take 1,200 Units by mouth daily., Disp: , Rfl:    Review of Systems  Constitutional: Negative for appetite change, chills, diaphoresis, fatigue, fever and unexpected weight change.  HENT: Positive for dental problem. Negative for congestion, drooling, ear pain, facial swelling, hearing loss, mouth sores, sneezing, sore throat, trouble swallowing and voice change.   Eyes: Negative for pain, discharge, redness, itching and visual disturbance.  Respiratory: Negative for cough, choking, shortness of breath and wheezing.   Cardiovascular: Negative for chest pain, palpitations and leg swelling.  Gastrointestinal: Negative for abdominal pain, blood in stool, constipation, diarrhea and vomiting.  Endocrine: Negative for cold intolerance, heat intolerance and polydipsia.  Genitourinary: Negative for decreased urine volume, dysuria and hematuria.  Musculoskeletal: Negative for arthralgias, back pain and gait problem.  Skin: Negative for rash.  Allergic/Immunologic: Negative for environmental allergies.  Neurological: Negative for seizures, syncope, light-headedness and headaches.  Hematological: Negative for adenopathy.  Psychiatric/Behavioral: Negative for agitation, dysphoric mood and suicidal ideas. The patient is not nervous/anxious.     Per HPI unless specifically indicated above     Objective:    BP 132/86 (BP Location: Left Arm, Patient Position: Sitting, Cuff Size: Large)   Pulse 79   Temp 97.5 F (36.4 C) (Other (Comment))   Ht 5'  9.75" (1.772 m)   Wt (!) 358 lb 6.4 oz (162.6 kg)   SpO2 98%   BMI 51.79 kg/m   Wt Readings from Last 3 Encounters:  08/12/15 (!) 358 lb 6.4 oz (162.6 kg)  08/03/15 (!) 374 lb (169.6 kg)  06/25/15 (!) 374 lb 1.6 oz (169.7 kg)    Physical Exam  Constitutional: He is oriented to person, place, and time. He appears well-developed and well-nourished.  HENT:  Head: Normocephalic and atraumatic.  Neck: Neck supple.  Cardiovascular: Normal rate and  regular rhythm.   Pulmonary/Chest: Effort normal and breath sounds normal. He has no wheezes.  Abdominal: Soft. Bowel sounds are normal. There is no hepatosplenomegaly. There is no tenderness.  Musculoskeletal: He exhibits no edema.       Right lower leg: He exhibits no tenderness, no swelling and no edema.       Left lower leg: He exhibits no tenderness, no swelling and no edema.  BLE with hyperpigmentation c/w chronic stasis.  No redness or edema  Lymphadenopathy:    He has no cervical adenopathy.  Neurological: He is alert and oriented to person, place, and time.  Skin: Skin is warm and dry.  Psychiatric: He has a normal mood and affect. His behavior is normal.  Vitals reviewed.       Assessment & Plan:   Encounter Diagnoses  Name Primary?  . Essential hypertension Yes  . Morbid obesity, unspecified obesity type (HCC)     -rx losartan to replace the lisinopril -encouraged to continue weight loss efforts to help long term lower extremities -F/u sept as scheduled. RTO sooner prn

## 2015-09-15 ENCOUNTER — Other Ambulatory Visit: Payer: Self-pay

## 2015-09-15 DIAGNOSIS — E785 Hyperlipidemia, unspecified: Secondary | ICD-10-CM

## 2015-09-15 DIAGNOSIS — IMO0001 Reserved for inherently not codable concepts without codable children: Secondary | ICD-10-CM

## 2015-09-15 DIAGNOSIS — E1165 Type 2 diabetes mellitus with hyperglycemia: Principal | ICD-10-CM

## 2015-09-22 ENCOUNTER — Other Ambulatory Visit: Payer: Self-pay | Admitting: Physician Assistant

## 2015-09-23 LAB — COMPLETE METABOLIC PANEL WITH GFR
ALBUMIN: 3.9 g/dL (ref 3.6–5.1)
ALK PHOS: 54 U/L (ref 40–115)
ALT: 10 U/L (ref 9–46)
AST: 13 U/L (ref 10–40)
BILIRUBIN TOTAL: 1.7 mg/dL — AB (ref 0.2–1.2)
BUN: 15 mg/dL (ref 7–25)
CO2: 27 mmol/L (ref 20–31)
CREATININE: 0.87 mg/dL (ref 0.60–1.35)
Calcium: 9.2 mg/dL (ref 8.6–10.3)
Chloride: 98 mmol/L (ref 98–110)
GLUCOSE: 143 mg/dL — AB (ref 65–99)
Potassium: 4.4 mmol/L (ref 3.5–5.3)
Sodium: 134 mmol/L — ABNORMAL LOW (ref 135–146)
TOTAL PROTEIN: 7 g/dL (ref 6.1–8.1)

## 2015-09-23 LAB — LIPID PANEL
Cholesterol: 115 mg/dL — ABNORMAL LOW (ref 125–200)
HDL: 34 mg/dL — AB (ref >=40)
LDL Cholesterol: 50 mg/dL (ref <130)
TRIGLYCERIDES: 156 mg/dL — AB (ref <150)
Total CHOL/HDL Ratio: 3.4 Ratio (ref <=5.0)
VLDL: 31 mg/dL — ABNORMAL HIGH (ref <30)

## 2015-09-23 LAB — HEMOGLOBIN A1C
Hgb A1c MFr Bld: 6.4 % — ABNORMAL HIGH (ref <5.7)
Mean Plasma Glucose: 137 mg/dL

## 2015-09-24 ENCOUNTER — Ambulatory Visit: Payer: Self-pay | Admitting: Physician Assistant

## 2015-09-24 ENCOUNTER — Encounter: Payer: Self-pay | Admitting: Physician Assistant

## 2015-09-24 VITALS — BP 136/78 | HR 82 | Temp 97.7°F | Ht 69.75 in | Wt 369.2 lb

## 2015-09-24 DIAGNOSIS — E785 Hyperlipidemia, unspecified: Secondary | ICD-10-CM

## 2015-09-24 DIAGNOSIS — I10 Essential (primary) hypertension: Secondary | ICD-10-CM

## 2015-09-24 DIAGNOSIS — E119 Type 2 diabetes mellitus without complications: Secondary | ICD-10-CM

## 2015-09-24 NOTE — Progress Notes (Signed)
BP 136/78 (BP Location: Left Arm, Patient Position: Sitting, Cuff Size: Large)   Pulse 82   Temp 97.7 F (36.5 C) (Other (Comment))   Ht 5' 9.75" (1.772 m)   Wt (!) 369 lb 3.2 oz (167.5 kg)   SpO2 98%   BMI 53.36 kg/m    Subjective:    Patient ID: Nathan Velez, male    DOB: 02/17/1965, 50 y.o.   MRN: [id number]  HPI: Nathan Velez is a 50 y.o. male presenting on 09/24/2015 for Hypertension; Diabetes; and Hyperlipidemia   HPI   Pt states no panic attackes in 3-4 months so she hasn't been needing his citalopram  Pt is feeling well and has no complaints.  He is working on exercising (walking) and trying to get his weight down  Relevant past medical, surgical, family and social history reviewed and updated as indicated. Interim medical history since our last visit reviewed. Allergies and medications reviewed and updated.  Current Outpatient Prescriptions:  .  allopurinol (ZYLOPRIM) 300 MG tablet, TAKE 1 Tablet BY MOUTH ONCE DAILY, Disp: 90 tablet, Rfl: 2 .  atorvastatin (LIPITOR) 20 MG tablet, TAKE 1 Tablet BY MOUTH ONCE DAILY, Disp: 90 tablet, Rfl: 2 .  citalopram (CELEXA) 20 MG tablet, Take 20 mg by mouth daily., Disp: , Rfl:  .  furosemide (LASIX) 40 MG tablet, Take 40 mg by mouth daily as needed for fluid., Disp: , Rfl:  .  ibuprofen (ADVIL,MOTRIN) 800 MG tablet, Take 1 tablet (800 mg total) by mouth every 8 (eight) hours as needed for mild pain., Disp: 30 tablet, Rfl: 0 .  JANUVIA 100 MG tablet, TAKE 1 Tablet BY MOUTH ONCE DAILY, Disp: 90 tablet, Rfl: 2 .  losartan (COZAAR) 50 MG tablet, Take 1 tablet (50 mg total) by mouth daily., Disp: 90 tablet, Rfl: 3 .  metFORMIN (GLUCOPHAGE) 1000 MG tablet, TAKE 1 Tablet  BY MOUTH TWICE DAILY WITH MEALS, Disp: 180 tablet, Rfl: 3 .  Omega-3 Fatty Acids (FISH OIL) 1000 MG CAPS, Take 2 capsules by mouth daily., Disp: , Rfl:  .  potassium chloride (K-DUR,KLOR-CON) 10 MEQ tablet, Take 10 mEq by mouth once as needed (takes with  lasix.). , Disp: , Rfl:  .  vitamin E 600 UNIT capsule, Take 1,200 Units by mouth daily., Disp: , Rfl:   Review of Systems  Constitutional: Negative for appetite change, chills, diaphoresis, fatigue, fever and unexpected weight change.  HENT: Negative for congestion, drooling, ear pain, facial swelling, hearing loss, mouth sores, sneezing, sore throat, trouble swallowing and voice change.   Eyes: Negative for pain, discharge, redness, itching and visual disturbance.  Respiratory: Negative for cough, choking, shortness of breath and wheezing.   Cardiovascular: Negative for chest pain, palpitations and leg swelling.  Gastrointestinal: Negative for abdominal pain, blood in stool, constipation, diarrhea and vomiting.  Endocrine: Negative for cold intolerance, heat intolerance and polydipsia.  Genitourinary: Negative for decreased urine volume, dysuria and hematuria.  Musculoskeletal: Negative for arthralgias, back pain and gait problem.  Skin: Negative for rash.  Allergic/Immunologic: Negative for environmental allergies.  Neurological: Negative for seizures, syncope, light-headedness and headaches.  Hematological: Negative for adenopathy.  Psychiatric/Behavioral: Negative for agitation, dysphoric mood and suicidal ideas. The patient is not nervous/anxious.     Per HPI unless specifically indicated above     Objective:    BP 136/78 (BP Location: Left Arm, Patient Position: Sitting, Cuff Size: Large)   Pulse 82   Temp 97.7 F (36.5 C) (Other (Comment))  Ht 5' 9.75" (1.772 m)   Wt (!) 369 lb 3.2 oz (167.5 kg)   SpO2 98%   BMI 53.36 kg/m   Wt Readings from Last 3 Encounters:  09/24/15 (!) 369 lb 3.2 oz (167.5 kg)  08/12/15 (!) 358 lb 6.4 oz (162.6 kg)  08/03/15 (!) 374 lb (169.6 kg)    Physical Exam  Constitutional: He is oriented to person, place, and time. He appears well-developed and well-nourished.  HENT:  Head: Normocephalic and atraumatic.  Neck: Neck supple.   Cardiovascular: Normal rate, regular rhythm and intact distal pulses.   Pulmonary/Chest: Effort normal and breath sounds normal. He has no wheezes.  Abdominal: Soft. Bowel sounds are normal. There is no hepatosplenomegaly. There is no tenderness.  Musculoskeletal: He exhibits no edema.  LE with some mild hyperpigmentation but no pitting edema  Lymphadenopathy:    He has no cervical adenopathy.  Neurological: He is alert and oriented to person, place, and time.  Skin: Skin is warm and dry.  Psychiatric: He has a normal mood and affect. His behavior is normal.  Vitals reviewed.   Results for orders placed or performed in visit on 09/15/15  HgB A1c  Result Value Ref Range   Hgb A1c MFr Bld 6.4 (H) <5.7 %   Mean Plasma Glucose 137 mg/dL  COMPLETE METABOLIC PANEL WITH GFR  Result Value Ref Range   Sodium 134 (L) 135 - 146 mmol/L   Potassium 4.4 3.5 - 5.3 mmol/L   Chloride 98 98 - 110 mmol/L   CO2 27 20 - 31 mmol/L   Glucose, Bld 143 (H) 65 - 99 mg/dL   BUN 15 7 - 25 mg/dL   Creat 1.61 0.96 - 0.45 mg/dL   Total Bilirubin 1.7 (H) 0.2 - 1.2 mg/dL   Alkaline Phosphatase 54 40 - 115 U/L   AST 13 10 - 40 U/L   ALT 10 9 - 46 U/L   Total Protein 7.0 6.1 - 8.1 g/dL   Albumin 3.9 3.6 - 5.1 g/dL   Calcium 9.2 8.6 - 40.9 mg/dL   GFR, Est African American >89 >=60 mL/min   GFR, Est Non African American >89 >=60 mL/min  Lipid Profile  Result Value Ref Range   Cholesterol 115 (L) 125 - 200 mg/dL   Triglycerides 811 (H) <150 mg/dL   HDL 34 (L) >=91 mg/dL   Total CHOL/HDL Ratio 3.4 <=5.0 Ratio   VLDL 31 (H) <30 mg/dL   LDL Cholesterol 50 <478 mg/dL      Assessment & Plan:   Encounter Diagnoses  Name Primary?  . Type 2 diabetes mellitus without complication, without long-term current use of insulin (HCC) Yes  . Essential hypertension   . Hyperlipidemia   . Morbid obesity, unspecified obesity type (HCC)      -Reviewed labs with pt -Discussed citalopram doesn't work prn so he can  just stop it since he doesn't need it -continue other medications -pt on list for diabetic eye eXAM -counseled on continuing weight loss efforts -f/u 3 months.  RTO sooner prn

## 2015-12-16 ENCOUNTER — Other Ambulatory Visit: Payer: Self-pay

## 2015-12-16 DIAGNOSIS — E119 Type 2 diabetes mellitus without complications: Secondary | ICD-10-CM

## 2015-12-16 DIAGNOSIS — E785 Hyperlipidemia, unspecified: Secondary | ICD-10-CM

## 2015-12-16 DIAGNOSIS — I10 Essential (primary) hypertension: Secondary | ICD-10-CM

## 2015-12-21 LAB — LIPID PANEL
CHOL/HDL RATIO: 5.5 ratio — AB (ref <5.0)
CHOLESTEROL: 182 mg/dL (ref <200)
HDL: 33 mg/dL — AB (ref >40)
LDL Cholesterol: 125 mg/dL — ABNORMAL HIGH (ref <100)
Triglycerides: 119 mg/dL (ref <150)
VLDL: 24 mg/dL (ref <30)

## 2015-12-22 LAB — COMPREHENSIVE METABOLIC PANEL
ALBUMIN: 4.2 g/dL (ref 3.6–5.1)
ALK PHOS: 47 U/L (ref 40–115)
ALT: 12 U/L (ref 9–46)
AST: 15 U/L (ref 10–40)
BUN: 20 mg/dL (ref 7–25)
CALCIUM: 9.5 mg/dL (ref 8.6–10.3)
CHLORIDE: 99 mmol/L (ref 98–110)
CO2: 28 mmol/L (ref 20–31)
Creat: 0.91 mg/dL (ref 0.60–1.35)
Glucose, Bld: 133 mg/dL — ABNORMAL HIGH (ref 65–99)
POTASSIUM: 5 mmol/L (ref 3.5–5.3)
Sodium: 137 mmol/L (ref 135–146)
TOTAL PROTEIN: 7.3 g/dL (ref 6.1–8.1)
Total Bilirubin: 1.1 mg/dL (ref 0.2–1.2)

## 2015-12-22 LAB — HEMOGLOBIN A1C
Hgb A1c MFr Bld: 6 % — ABNORMAL HIGH (ref <5.7)
Mean Plasma Glucose: 126 mg/dL

## 2015-12-23 ENCOUNTER — Ambulatory Visit: Payer: Self-pay | Admitting: Physician Assistant

## 2015-12-23 ENCOUNTER — Encounter: Payer: Self-pay | Admitting: Physician Assistant

## 2015-12-23 VITALS — BP 122/74 | HR 83 | Temp 97.7°F | Ht 69.75 in | Wt 352.0 lb

## 2015-12-23 DIAGNOSIS — E119 Type 2 diabetes mellitus without complications: Secondary | ICD-10-CM

## 2015-12-23 DIAGNOSIS — I10 Essential (primary) hypertension: Secondary | ICD-10-CM

## 2015-12-23 DIAGNOSIS — E785 Hyperlipidemia, unspecified: Secondary | ICD-10-CM

## 2015-12-23 MED ORDER — METFORMIN HCL 500 MG PO TABS: 500.0000 mg | ORAL_TABLET | Freq: Two times a day (BID) | ORAL | 3 refills | Status: DC

## 2015-12-23 NOTE — Progress Notes (Signed)
BP 122/74 (BP Location: Left Arm, Patient Position: Sitting, Cuff Size: Large)   Pulse 83   Temp 97.7 F (36.5 C) (Other (Comment))   Ht 5' 9.75" (1.772 m)   Wt (!) 352 lb (159.7 kg)   SpO2 99%   BMI 50.87 kg/m    Subjective:    Patient ID: Nathan Velez, male    DOB: 03/09/1965, 50 y.o.   MRN: [id number]  HPI: Nathan Velez is a 50 y.o. male presenting on 12/23/2015 for Diabetes and Hypertension   HPI   Pt doing well.  He is continuing to lose weight.  States no edema since last OV.  He is very happy that his lower legs are returning to a normal color (and loosing the hyperpigmentation that he had when he first came to [facility]). He was 394 pounds at his new pt appointment in may.  His mood is good, no anxiety  Pt had appt for eye exam but he cancelled it to come to his appt here.    Relevant past medical, surgical, family and social history reviewed and updated as indicated. Interim medical history since our last visit reviewed. Allergies and medications reviewed and updated.   Current Outpatient Prescriptions:  .  allopurinol (ZYLOPRIM) 300 MG tablet, TAKE 1 Tablet BY MOUTH ONCE DAILY, Disp: 90 tablet, Rfl: 2 .  atorvastatin (LIPITOR) 20 MG tablet, TAKE 1 Tablet BY MOUTH ONCE DAILY, Disp: 90 tablet, Rfl: 2 .  ibuprofen (ADVIL,MOTRIN) 800 MG tablet, Take 1 tablet (800 mg total) by mouth every 8 (eight) hours as needed for mild pain., Disp: 30 tablet, Rfl: 0 .  losartan (COZAAR) 50 MG tablet, Take 1 tablet (50 mg total) by mouth daily., Disp: 90 tablet, Rfl: 3 .  metFORMIN (GLUCOPHAGE) 1000 MG tablet, TAKE 1 Tablet  BY MOUTH TWICE DAILY WITH MEALS, Disp: 180 tablet, Rfl: 3 .  Omega-3 Fatty Acids (FISH OIL) 1000 MG CAPS, Take 2 capsules by mouth daily., Disp: , Rfl:  .  vitamin E 600 UNIT capsule, Take 1,200 Units by mouth daily., Disp: , Rfl:  .  JANUVIA 100 MG tablet, TAKE 1 Tablet BY MOUTH ONCE DAILY, Disp: 90 tablet, Rfl: 2   Review of Systems  Constitutional:  Negative for appetite change, chills, diaphoresis, fatigue, fever and unexpected weight change.  HENT: Negative for congestion, dental problem, drooling, ear pain, facial swelling, hearing loss, mouth sores, sneezing, sore throat, trouble swallowing and voice change.   Eyes: Negative for pain, discharge, redness, itching and visual disturbance.  Respiratory: Negative for cough, choking, shortness of breath and wheezing.   Cardiovascular: Negative for chest pain, palpitations and leg swelling.  Gastrointestinal: Negative for abdominal pain, blood in stool, constipation, diarrhea and vomiting.  Endocrine: Negative for cold intolerance, heat intolerance and polydipsia.  Genitourinary: Negative for decreased urine volume, dysuria and hematuria.  Musculoskeletal: Negative for arthralgias, back pain and gait problem.  Skin: Negative for rash.  Allergic/Immunologic: Negative for environmental allergies.  Neurological: Negative for seizures, syncope, light-headedness and headaches.  Hematological: Negative for adenopathy.  Psychiatric/Behavioral: Negative for agitation, dysphoric mood and suicidal ideas. The patient is not nervous/anxious.     Per HPI unless specifically indicated above     Objective:    BP 122/74 (BP Location: Left Arm, Patient Position: Sitting, Cuff Size: Large)   Pulse 83   Temp 97.7 F (36.5 C) (Other (Comment))   Ht 5' 9.75" (1.772 m)   Wt (!) 352 lb (159.7 kg)   SpO2 99%  BMI 50.87 kg/m   Wt Readings from Last 3 Encounters:  12/23/15 (!) 352 lb (159.7 kg)  09/24/15 (!) 369 lb 3.2 oz (167.5 kg)  08/12/15 (!) 358 lb 6.4 oz (162.6 kg)    Physical Exam  Constitutional: He is oriented to person, place, and time. He appears well-developed and well-nourished.  HENT:  Head: Normocephalic and atraumatic.  Neck: Neck supple.  Cardiovascular: Normal rate and regular rhythm.   Pulmonary/Chest: Effort normal and breath sounds normal. He has no wheezes.  Abdominal: Soft.  Bowel sounds are normal. There is no hepatosplenomegaly. There is no tenderness.  Musculoskeletal: He exhibits no edema.  Lymphadenopathy:    He has no cervical adenopathy.  Neurological: He is alert and oriented to person, place, and time.  Skin: Skin is warm and dry.  Psychiatric: He has a normal mood and affect. His behavior is normal.  Vitals reviewed.   Results for orders placed or performed in visit on 12/16/15  HgB A1c  Result Value Ref Range   Hgb A1c MFr Bld 6.0 (H) <5.7 %   Mean Plasma Glucose 126 mg/dL  Comprehensive Metabolic Panel (CMET)  Result Value Ref Range   Sodium 137 135 - 146 mmol/L   Potassium 5.0 3.5 - 5.3 mmol/L   Chloride 99 98 - 110 mmol/L   CO2 28 20 - 31 mmol/L   Glucose, Bld 133 (H) 65 - 99 mg/dL   BUN 20 7 - 25 mg/dL   Creat 8.110.91 9.140.60 - 7.821.35 mg/dL   Total Bilirubin 1.1 0.2 - 1.2 mg/dL   Alkaline Phosphatase 47 40 - 115 U/L   AST 15 10 - 40 U/L   ALT 12 9 - 46 U/L   Total Protein 7.3 6.1 - 8.1 g/dL   Albumin 4.2 3.6 - 5.1 g/dL   Calcium 9.5 8.6 - 95.610.3 mg/dL  Lipid Profile  Result Value Ref Range   Cholesterol 182 <200 mg/dL   Triglycerides 213119 <086<150 mg/dL   HDL 33 (L) >57>40 mg/dL   Total CHOL/HDL Ratio 5.5 (H) <5.0 Ratio   VLDL 24 <30 mg/dL   LDL Cholesterol 846125 (H) <100 mg/dL      Assessment & Plan:   Encounter Diagnoses  Name Primary?  . Type 2 diabetes mellitus without complication, without long-term current use of insulin (HCC) Yes  . Essential hypertension   . Hyperlipidemia, unspecified hyperlipidemia type   . Morbid obesity, unspecified obesity type (HCC)     -reviewed labs with pt -refer for diabetic Eye exam -Cut back metformin to 500mg  bid. Continue januvia -continue weight loss efforts -follow up 3 months.  RTO sooner prn

## 2016-03-14 ENCOUNTER — Other Ambulatory Visit: Payer: Self-pay

## 2016-03-14 DIAGNOSIS — E119 Type 2 diabetes mellitus without complications: Secondary | ICD-10-CM

## 2016-03-14 DIAGNOSIS — E785 Hyperlipidemia, unspecified: Secondary | ICD-10-CM

## 2016-03-14 DIAGNOSIS — I10 Essential (primary) hypertension: Secondary | ICD-10-CM

## 2016-03-21 LAB — COMPREHENSIVE METABOLIC PANEL
ALBUMIN: 4 g/dL (ref 3.6–5.1)
ALK PHOS: 47 U/L (ref 40–115)
ALT: 17 U/L (ref 9–46)
AST: 17 U/L (ref 10–40)
BUN: 14 mg/dL (ref 7–25)
CHLORIDE: 104 mmol/L (ref 98–110)
CO2: 27 mmol/L (ref 20–31)
CREATININE: 0.83 mg/dL (ref 0.60–1.35)
Calcium: 9 mg/dL (ref 8.6–10.3)
Glucose, Bld: 124 mg/dL — ABNORMAL HIGH (ref 65–99)
POTASSIUM: 4.5 mmol/L (ref 3.5–5.3)
Sodium: 139 mmol/L (ref 135–146)
TOTAL PROTEIN: 6.7 g/dL (ref 6.1–8.1)
Total Bilirubin: 1.3 mg/dL — ABNORMAL HIGH (ref 0.2–1.2)

## 2016-03-21 LAB — LIPID PANEL
CHOLESTEROL: 125 mg/dL (ref <200)
HDL: 25 mg/dL — ABNORMAL LOW (ref >40)
LDL Cholesterol: 75 mg/dL (ref <100)
TRIGLYCERIDES: 124 mg/dL (ref <150)
Total CHOL/HDL Ratio: 5 Ratio — ABNORMAL HIGH (ref <5.0)
VLDL: 25 mg/dL (ref <30)

## 2016-03-21 LAB — HEMOGLOBIN A1C
Hgb A1c MFr Bld: 6 % — ABNORMAL HIGH (ref <5.7)
MEAN PLASMA GLUCOSE: 126 mg/dL

## 2016-03-22 ENCOUNTER — Encounter: Payer: Self-pay | Admitting: Physician Assistant

## 2016-03-22 ENCOUNTER — Ambulatory Visit: Payer: Self-pay | Admitting: Physician Assistant

## 2016-03-22 VITALS — BP 126/76 | HR 75 | Temp 97.5°F | Ht 69.75 in | Wt 327.0 lb

## 2016-03-22 DIAGNOSIS — E785 Hyperlipidemia, unspecified: Secondary | ICD-10-CM

## 2016-03-22 DIAGNOSIS — I10 Essential (primary) hypertension: Secondary | ICD-10-CM

## 2016-03-22 DIAGNOSIS — E119 Type 2 diabetes mellitus without complications: Secondary | ICD-10-CM

## 2016-03-22 NOTE — Progress Notes (Signed)
BP 126/76 (BP Location: Left Arm, Patient Position: Sitting, Cuff Size: Large)   Pulse 75   Temp 97.5 F (36.4 C) (Other (Comment))   Ht 5' 9.75" (1.772 m)   Wt (!) 327 lb (148.3 kg)   SpO2 98%   BMI 47.26 kg/m    Subjective:    Patient ID: Nathan Velez, male    DOB: 11/10/1965, 51 y.o.   MRN: [id number]  HPI: Nathan Velez is a 51 y.o. male presenting on 03/22/2016 for Diabetes; Hyperlipidemia; and Hypertension   HPI    Pt is continuing to work on his weight loss.  He is exercising and watching what he eats.    Pt is working at [employer]  Pt has applied for insurance but hasn't heard on it yet  Relevant past medical, surgical, family and social history reviewed and updated as indicated. Interim medical history since our last visit reviewed. Allergies and medications reviewed and updated.   Current Outpatient Prescriptions:  .  allopurinol (ZYLOPRIM) 300 MG tablet, TAKE 1 Tablet BY MOUTH ONCE DAILY, Disp: 90 tablet, Rfl: 2 .  atorvastatin (LIPITOR) 20 MG tablet, TAKE 1 Tablet BY MOUTH ONCE DAILY, Disp: 90 tablet, Rfl: 2 .  ibuprofen (ADVIL,MOTRIN) 800 MG tablet, Take 1 tablet (800 mg total) by mouth every 8 (eight) hours as needed for mild pain., Disp: 30 tablet, Rfl: 0 .  JANUVIA 100 MG tablet, TAKE 1 Tablet BY MOUTH ONCE DAILY, Disp: 90 tablet, Rfl: 2 .  losartan (COZAAR) 50 MG tablet, Take 1 tablet (50 mg total) by mouth daily., Disp: 90 tablet, Rfl: 3 .  metFORMIN (GLUCOPHAGE) 500 MG tablet, Take 1 tablet (500 mg total) by mouth 2 (two) times daily with a meal. (this is a dosage changed), Disp: 180 tablet, Rfl: 3 .  Omega-3 Fatty Acids (FISH OIL) 1000 MG CAPS, Take 2 capsules by mouth daily., Disp: , Rfl:  .  vitamin E 600 UNIT capsule, Take 1,200 Units by mouth daily., Disp: , Rfl:    Review of Systems  Constitutional: Negative for appetite change, chills, diaphoresis, fatigue, fever and unexpected weight change.  HENT: Negative for congestion, dental  problem, drooling, ear pain, facial swelling, hearing loss, mouth sores, sneezing, sore throat, trouble swallowing and voice change.   Eyes: Negative for pain, discharge, redness, itching and visual disturbance.  Respiratory: Negative for cough, choking, shortness of breath and wheezing.   Cardiovascular: Negative for chest pain, palpitations and leg swelling.  Gastrointestinal: Negative for abdominal pain, blood in stool, constipation, diarrhea and vomiting.  Endocrine: Negative for cold intolerance, heat intolerance and polydipsia.  Genitourinary: Negative for decreased urine volume, dysuria and hematuria.  Musculoskeletal: Negative for arthralgias, back pain and gait problem.  Skin: Negative for rash.  Allergic/Immunologic: Negative for environmental allergies.  Neurological: Negative for seizures, syncope, light-headedness and headaches.  Hematological: Negative for adenopathy.  Psychiatric/Behavioral: Negative for agitation, dysphoric mood and suicidal ideas. The patient is not nervous/anxious.     Per HPI unless specifically indicated above     Objective:    BP 126/76 (BP Location: Left Arm, Patient Position: Sitting, Cuff Size: Large)   Pulse 75   Temp 97.5 F (36.4 C) (Other (Comment))   Ht 5' 9.75" (1.772 m)   Wt (!) 327 lb (148.3 kg)   SpO2 98%   BMI 47.26 kg/m   Wt Readings from Last 3 Encounters:  03/22/16 (!) 327 lb (148.3 kg)  12/23/15 (!) 352 lb (159.7 kg)  09/24/15 (!) 369  lb 3.2 oz (167.5 kg)    Physical Exam  Constitutional: He is oriented to person, place, and time. He appears well-developed and well-nourished.  HENT:  Head: Normocephalic and atraumatic.  Neck: Neck supple.  Cardiovascular: Normal rate and regular rhythm.   Pulmonary/Chest: Effort normal and breath sounds normal. He has no wheezes.  Abdominal: Soft. Bowel sounds are normal. There is no hepatosplenomegaly. There is no tenderness.  Musculoskeletal: He exhibits no edema.  Feet without  ulceration  Lymphadenopathy:    He has no cervical adenopathy.  Neurological: He is alert and oriented to person, place, and time.  Skin: Skin is warm and dry.  Psychiatric: He has a normal mood and affect. His behavior is normal.  Vitals reviewed.   Results for orders placed or performed in visit on 03/14/16  Hemoglobin A1c  Result Value Ref Range   Hgb A1c MFr Bld 6.0 (H) <5.7 %   Mean Plasma Glucose 126 mg/dL  Comprehensive metabolic panel  Result Value Ref Range   Sodium 139 135 - 146 mmol/L   Potassium 4.5 3.5 - 5.3 mmol/L   Chloride 104 98 - 110 mmol/L   CO2 27 20 - 31 mmol/L   Glucose, Bld 124 (H) 65 - 99 mg/dL   BUN 14 7 - 25 mg/dL   Creat 1.61 0.96 - 0.45 mg/dL   Total Bilirubin 1.3 (H) 0.2 - 1.2 mg/dL   Alkaline Phosphatase 47 40 - 115 U/L   AST 17 10 - 40 U/L   ALT 17 9 - 46 U/L   Total Protein 6.7 6.1 - 8.1 g/dL   Albumin 4.0 3.6 - 5.1 g/dL   Calcium 9.0 8.6 - 40.9 mg/dL  Lipid panel  Result Value Ref Range   Cholesterol 125 <200 mg/dL   Triglycerides 811 <914 mg/dL   HDL 25 (L) >78 mg/dL   Total CHOL/HDL Ratio 5.0 (H) <5.0 Ratio   VLDL 25 <30 mg/dL   LDL Cholesterol 75 <295 mg/dL      Assessment & Plan:   Encounter Diagnoses  Name Primary?  . Type 2 diabetes mellitus without complication, without long-term current use of insulin (HCC) Yes  . Essential hypertension   . Hyperlipidemia, unspecified hyperlipidemia type   . Morbid obesity, unspecified obesity type (HCC)     -reviewed labs with pt -discontinue metformin. Continue januvia and other medications -pt to continue with healthful eating and regular exercise -follow up 3 months.  RTO sooner prn.  Pt to contact office and cancel appointment if he gets insurance

## 2016-04-20 ENCOUNTER — Encounter: Payer: Self-pay | Admitting: Physician Assistant

## 2016-06-16 ENCOUNTER — Other Ambulatory Visit: Payer: Self-pay | Admitting: Physician Assistant

## 2016-06-16 MED ORDER — ALLOPURINOL 300 MG PO TABS: 300.0000 mg | ORAL_TABLET | Freq: Every day | ORAL | 0 refills | Status: DC

## 2016-06-16 MED ORDER — SITAGLIPTIN PHOSPHATE 100 MG PO TABS: 100.0000 mg | ORAL_TABLET | Freq: Every day | ORAL | 0 refills | Status: DC

## 2016-06-16 MED ORDER — LOSARTAN POTASSIUM 50 MG PO TABS: 50.0000 mg | ORAL_TABLET | Freq: Every day | ORAL | 0 refills | Status: DC

## 2016-06-22 ENCOUNTER — Ambulatory Visit: Payer: Self-pay | Admitting: Physician Assistant

## 2016-06-23 ENCOUNTER — Other Ambulatory Visit: Payer: Self-pay | Admitting: Physician Assistant

## 2016-06-23 MED ORDER — LOSARTAN POTASSIUM 50 MG PO TABS: 50.0000 mg | ORAL_TABLET | Freq: Every day | ORAL | 2 refills | Status: DC

## 2016-06-23 MED ORDER — ALLOPURINOL 300 MG PO TABS: 300.0000 mg | ORAL_TABLET | Freq: Every day | ORAL | 2 refills | Status: DC

## 2016-06-27 ENCOUNTER — Ambulatory Visit: Payer: Self-pay | Admitting: Physician Assistant

## 2016-06-27 ENCOUNTER — Encounter: Payer: Self-pay | Admitting: Physician Assistant

## 2016-06-27 VITALS — BP 124/76 | HR 98 | Temp 97.7°F | Ht 69.75 in | Wt 343.0 lb

## 2016-06-27 DIAGNOSIS — E785 Hyperlipidemia, unspecified: Secondary | ICD-10-CM

## 2016-06-27 DIAGNOSIS — I10 Essential (primary) hypertension: Secondary | ICD-10-CM

## 2016-06-27 DIAGNOSIS — E119 Type 2 diabetes mellitus without complications: Secondary | ICD-10-CM

## 2016-06-27 NOTE — Progress Notes (Signed)
BP 124/76 (BP Location: Left Arm, Patient Position: Sitting, Cuff Size: Large)   Pulse 98   Temp 97.7 F (36.5 C) (Other (Comment))   Ht 5' 9.75" (1.772 m)   Wt (!) 343 lb (155.6 kg)   SpO2 96%   BMI 49.57 kg/m    Subjective:    Patient ID: Nathan Velez, male    DOB: 02/08/1965, 51 y.o.   MRN: [id number]  HPI: Nathan Velez is a 51 y.o. male presenting on 06/27/2016 for Diabetes; Hyperlipidemia; and Hypertension   HPI   Pt states his insurance starts next month.  Pt is not active with [facility] right now so he is no longer taking the [place].  So he restarted taking his metformin.   He ran out of his [place] about a month ago.  He has been checking his bs at home some and it is in the morning about 107.    Relevant past medical, surgical, family and social history reviewed and updated as indicated. Interim medical history since our last visit reviewed. Allergies and medications reviewed and updated.   Current Outpatient Prescriptions:  .  allopurinol (ZYLOPRIM) 300 MG tablet, Take 1 tablet (300 mg total) by mouth daily., Disp: 30 tablet, Rfl: 2 .  ibuprofen (ADVIL,MOTRIN) 800 MG tablet, Take 1 tablet (800 mg total) by mouth every 8 (eight) hours as needed for mild pain., Disp: 30 tablet, Rfl: 0 .  losartan (COZAAR) 50 MG tablet, Take 1 tablet (50 mg total) by mouth daily., Disp: 30 tablet, Rfl: 2 .  metFORMIN (GLUCOPHAGE) 500 MG tablet, Take by mouth 3 (three) times daily., Disp: , Rfl:  .  Omega-3 Fatty Acids (FISH OIL) 1000 MG CAPS, Take 2 capsules by mouth daily., Disp: , Rfl:  .  vitamin E 600 UNIT capsule, Take 1,200 Units by mouth daily., Disp: , Rfl:  .  atorvastatin (LIPITOR) 20 MG tablet, TAKE 1 Tablet BY MOUTH ONCE DAILY (Patient not taking: Reported on 06/27/2016), Disp: 90 tablet, Rfl: 2 .  sitaGLIPtin (JANUVIA) 100 MG tablet, Take 1 tablet (100 mg total) by mouth daily. (Patient not taking: Reported on 06/27/2016), Disp: 90 tablet, Rfl: 0   Review of  Systems  Constitutional: Negative for appetite change, chills, diaphoresis, fatigue, fever and unexpected weight change.  HENT: Negative for congestion, dental problem, drooling, ear pain, facial swelling, hearing loss, mouth sores, sneezing, sore throat, trouble swallowing and voice change.   Eyes: Negative for pain, discharge, redness, itching and visual disturbance.  Respiratory: Negative for cough, choking, shortness of breath and wheezing.   Cardiovascular: Negative for chest pain, palpitations and leg swelling.  Gastrointestinal: Negative for abdominal pain, blood in stool, constipation, diarrhea and vomiting.  Endocrine: Negative for cold intolerance, heat intolerance and polydipsia.  Genitourinary: Negative for decreased urine volume, dysuria and hematuria.  Musculoskeletal: Negative for arthralgias, back pain and gait problem.  Skin: Negative for rash.  Allergic/Immunologic: Negative for environmental allergies.  Neurological: Negative for seizures, syncope, light-headedness and headaches.  Hematological: Negative for adenopathy.  Psychiatric/Behavioral: Negative for agitation, dysphoric mood and suicidal ideas. The patient is not nervous/anxious.     Per HPI unless specifically indicated above     Objective:    BP 124/76 (BP Location: Left Arm, Patient Position: Sitting, Cuff Size: Large)   Pulse 98   Temp 97.7 F (36.5 C) (Other (Comment))   Ht 5' 9.75" (1.772 m)   Wt (!) 343 lb (155.6 kg)   SpO2 96%   BMI 49.57 kg/m  Wt Readings from Last 3 Encounters:  06/27/16 (!) 343 lb (155.6 kg)  03/22/16 (!) 327 lb (148.3 kg)  12/23/15 (!) 352 lb (159.7 kg)    Physical Exam  Constitutional: He is oriented to person, place, and time. He appears well-developed and well-nourished.  HENT:  Head: Normocephalic and atraumatic.  Neck: Neck supple.  Cardiovascular: Normal rate and regular rhythm.   Pulmonary/Chest: Effort normal and breath sounds normal. He has no wheezes.   Abdominal: Soft. Bowel sounds are normal. There is no hepatosplenomegaly. There is no tenderness.  Musculoskeletal: He exhibits no edema.  Lymphadenopathy:    He has no cervical adenopathy.  Neurological: He is alert and oriented to person, place, and time.  Skin: Skin is warm and dry.  Psychiatric: He has a normal mood and affect. His behavior is normal.  Vitals reviewed.   Foot exam done     Assessment & Plan:   Encounter Diagnoses  Name Primary?  . Type 2 diabetes mellitus without complication, without long-term current use of insulin (HCC) Yes  . Essential hypertension   . Hyperlipidemia, unspecified hyperlipidemia type   . Morbid obesity, unspecified obesity type (HCC)     -No labs today since pt is all not taking his regular medications.  Discussed with pt and he agrees -counseled pt to watch weight -discussed with pt that he needs colon cancer screening.  Will not give him iFOBT today since he is getting insurance soon and can get colonoscopy.  He is in agreement with this -discussed with pt to get new PCP as soon as his insurance takes effect.  Pt counseled to RTO here if he ends up not getting insurance

## 2016-08-11 ENCOUNTER — Emergency Department (HOSPITAL_COMMUNITY): Payer: Self-pay

## 2016-08-11 ENCOUNTER — Inpatient Hospital Stay (HOSPITAL_COMMUNITY)
Admission: EM | Admit: 2016-08-11 | Discharge: 2016-08-14 | DRG: 603 | Disposition: A | Discharge status: Home / Self Care | Payer: Self-pay | Attending: Internal Medicine | Admitting: Internal Medicine

## 2016-08-11 ENCOUNTER — Encounter (HOSPITAL_COMMUNITY): Payer: Self-pay | Admitting: Emergency Medicine

## 2016-08-11 DIAGNOSIS — R000 Tachycardia, unspecified: Secondary | ICD-10-CM | POA: Diagnosis present

## 2016-08-11 DIAGNOSIS — Z79899 Other long term (current) drug therapy: Secondary | ICD-10-CM

## 2016-08-11 DIAGNOSIS — L0390 Cellulitis, unspecified: Secondary | ICD-10-CM | POA: Diagnosis present

## 2016-08-11 DIAGNOSIS — I10 Essential (primary) hypertension: Secondary | ICD-10-CM

## 2016-08-11 DIAGNOSIS — Z7984 Long term (current) use of oral hypoglycemic drugs: Secondary | ICD-10-CM

## 2016-08-11 DIAGNOSIS — Z6841 Body Mass Index (BMI) 40.0 and over, adult: Secondary | ICD-10-CM

## 2016-08-11 DIAGNOSIS — Z8262 Family history of osteoporosis: Secondary | ICD-10-CM

## 2016-08-11 DIAGNOSIS — Z880 Allergy status to penicillin: Secondary | ICD-10-CM

## 2016-08-11 DIAGNOSIS — M109 Gout, unspecified: Secondary | ICD-10-CM | POA: Diagnosis present

## 2016-08-11 DIAGNOSIS — E119 Type 2 diabetes mellitus without complications: Secondary | ICD-10-CM

## 2016-08-11 DIAGNOSIS — Z8249 Family history of ischemic heart disease and other diseases of the circulatory system: Secondary | ICD-10-CM

## 2016-08-11 DIAGNOSIS — G4730 Sleep apnea, unspecified: Secondary | ICD-10-CM | POA: Diagnosis present

## 2016-08-11 DIAGNOSIS — Z8349 Family history of other endocrine, nutritional and metabolic diseases: Secondary | ICD-10-CM

## 2016-08-11 DIAGNOSIS — Z888 Allergy status to other drugs, medicaments and biological substances status: Secondary | ICD-10-CM

## 2016-08-11 DIAGNOSIS — L03115 Cellulitis of right lower limb: Principal | ICD-10-CM

## 2016-08-11 LAB — BASIC METABOLIC PANEL
ANION GAP: 9 (ref 5–15)
BUN: 19 mg/dL (ref 6–20)
CHLORIDE: 101 mmol/L (ref 101–111)
CO2: 28 mmol/L (ref 22–32)
CREATININE: 0.98 mg/dL (ref 0.61–1.24)
Calcium: 9.6 mg/dL (ref 8.9–10.3)
GFR calc non Af Amer: >60 mL/min (ref >60)
GLUCOSE: 146 mg/dL — AB (ref 65–99)
Potassium: 5.1 mmol/L (ref 3.5–5.1)
Sodium: 138 mmol/L (ref 135–145)

## 2016-08-11 LAB — CBC WITH DIFFERENTIAL/PLATELET
BASOS ABS: 0 10*3/uL (ref 0.0–0.1)
BASOS PCT: 0 %
Eosinophils Absolute: 0 10*3/uL (ref 0.0–0.7)
Eosinophils Relative: 0 %
HEMATOCRIT: 43.2 % (ref 39.0–52.0)
HEMOGLOBIN: 15 g/dL (ref 13.0–17.0)
Lymphocytes Relative: 4 %
Lymphs Abs: 0.8 10*3/uL (ref 0.7–4.0)
MCH: 31.1 pg (ref 26.0–34.0)
MCHC: 34.7 g/dL (ref 30.0–36.0)
MCV: 89.6 fL (ref 78.0–100.0)
MONOS PCT: 5 %
Monocytes Absolute: 1.2 10*3/uL — ABNORMAL HIGH (ref 0.1–1.0)
NEUTROS ABS: 20.9 10*3/uL — AB (ref 1.7–7.7)
NEUTROS PCT: 91 %
PLATELETS: 178 10*3/uL (ref 150–400)
RBC: 4.82 MIL/uL (ref 4.22–5.81)
RDW: 13.1 % (ref 11.5–15.5)
WBC: 23 10*3/uL — AB (ref 4.0–10.5)

## 2016-08-11 LAB — GLUCOSE, CAPILLARY: GLUCOSE-CAPILLARY: 117 mg/dL — AB (ref 65–99)

## 2016-08-11 LAB — I-STAT CG4 LACTIC ACID, ED: LACTIC ACID, VENOUS: 1.76 mmol/L (ref 0.5–1.9)

## 2016-08-11 MED ORDER — SODIUM CHLORIDE 0.9 % IV BOLUS (SEPSIS)
1000.0000 mL | Freq: Once | INTRAVENOUS | Status: AC
  Administered 2016-08-11: 1000 mL via INTRAVENOUS

## 2016-08-11 MED ORDER — ALLOPURINOL 300 MG PO TABS
300.0000 mg | ORAL_TABLET | Freq: Every day | ORAL | Status: DC
  Administered 2016-08-12 – 2016-08-14 (×3): 300 mg via ORAL
  Filled 2016-08-11 (×3): qty 1

## 2016-08-11 MED ORDER — ACETAMINOPHEN 325 MG PO TABS
650.0000 mg | ORAL_TABLET | Freq: Four times a day (QID) | ORAL | Status: DC | PRN
  Administered 2016-08-11 – 2016-08-12 (×3): 650 mg via ORAL
  Filled 2016-08-11 (×3): qty 2

## 2016-08-11 MED ORDER — INSULIN ASPART 100 UNIT/ML ~~LOC~~ SOLN
0.0000 [IU] | Freq: Three times a day (TID) | SUBCUTANEOUS | Status: DC
Start: 2016-08-12 — End: 2016-08-15
  Administered 2016-08-12 (×2): 2 [IU] via SUBCUTANEOUS
  Administered 2016-08-12: 3 [IU] via SUBCUTANEOUS
  Administered 2016-08-13 (×2): 2 [IU] via SUBCUTANEOUS
  Administered 2016-08-13 – 2016-08-14 (×2): 3 [IU] via SUBCUTANEOUS
  Administered 2016-08-14: 2 [IU] via SUBCUTANEOUS

## 2016-08-11 MED ORDER — INSULIN ASPART 100 UNIT/ML ~~LOC~~ SOLN: 0.0000 [IU] | Freq: Every day | SUBCUTANEOUS | Status: DC

## 2016-08-11 MED ORDER — PNEUMOCOCCAL VAC POLYVALENT 25 MCG/0.5ML IJ INJ
0.5000 mL | INJECTION | INTRAMUSCULAR | Status: AC
  Administered 2016-08-12: 0.5 mL via INTRAMUSCULAR
  Filled 2016-08-11: qty 0.5

## 2016-08-11 MED ORDER — SODIUM CHLORIDE 0.9 % IV SOLN
INTRAVENOUS | Status: DC
  Administered 2016-08-11 – 2016-08-12 (×3): via INTRAVENOUS

## 2016-08-11 MED ORDER — ONDANSETRON HCL 4 MG/2ML IJ SOLN: 4.0000 mg | Freq: Four times a day (QID) | INTRAMUSCULAR | Status: DC | PRN

## 2016-08-11 MED ORDER — VANCOMYCIN HCL IN DEXTROSE 1-5 GM/200ML-% IV SOLN
1000.0000 mg | Freq: Once | INTRAVENOUS | Status: AC
  Administered 2016-08-11: 1000 mg via INTRAVENOUS
  Filled 2016-08-11: qty 200

## 2016-08-11 MED ORDER — METFORMIN HCL 500 MG PO TABS
1000.0000 mg | ORAL_TABLET | Freq: Two times a day (BID) | ORAL | Status: DC
  Administered 2016-08-12 – 2016-08-14 (×6): 1000 mg via ORAL
  Filled 2016-08-11 (×6): qty 2

## 2016-08-11 MED ORDER — ENOXAPARIN SODIUM 80 MG/0.8ML ~~LOC~~ SOLN
80.0000 mg | SUBCUTANEOUS | Status: DC
  Administered 2016-08-11 – 2016-08-13 (×3): 80 mg via SUBCUTANEOUS
  Filled 2016-08-11 (×3): qty 0.8

## 2016-08-11 MED ORDER — CLINDAMYCIN PHOSPHATE 600 MG/50ML IV SOLN
600.0000 mg | Freq: Three times a day (TID) | INTRAVENOUS | Status: DC
  Administered 2016-08-11 – 2016-08-13 (×5): 600 mg via INTRAVENOUS
  Filled 2016-08-11 (×14): qty 50

## 2016-08-11 MED ORDER — ACETAMINOPHEN 650 MG RE SUPP: 650.0000 mg | Freq: Four times a day (QID) | RECTAL | Status: DC | PRN

## 2016-08-11 MED ORDER — ONDANSETRON HCL 4 MG PO TABS: 4.0000 mg | ORAL_TABLET | Freq: Four times a day (QID) | ORAL | Status: DC | PRN

## 2016-08-11 NOTE — H&P (Signed)
History and Physical    Serita GritBasilakis J Brunetto ZOX:[id number] DOB: 06/27/1965 DOA: 08/11/2016  PCP: Jacquelin HawkingMcElroy, Shannon, PA-C  Patient coming from: Home  I have personally briefly reviewed patient[facility]  Chief Complaint: Cellulitis  HPI: Serita GritBasilakis J Heitman is a 51 y.o. male with medical history significant of hypertension and diabetes, presents to the emergency room today with complaints of right lower extremity cellulitis. Patient noticed redness on his right lower extremity this morning. He has also felt feverish this morning. He has noted that the cellulitis has rapidly progressed up his right lower extremity since onset this morning. He has not noticed any draining wounds fluctuance. He has felt generally weak. Prior to today, he was in his usual state of health. Has not had any shortness of breath, cough, dysuria or any other complaints.  ED Course: He was noted to be mildly tachycardic on admission. Temperature of 99.6. White blood cell count greater than 20,000. He was noted to have mild erythema in his lower extremity. He's been referred for admission.  Review of Systems: As per HPI otherwise 10 point review of systems negative.    Past Medical History:  Diagnosis Date  . Bursitis   . Cellulitis    L Leg  . Depression   . Diabetes mellitus without complication (HCC)   . Edema   . Gout   . Hypercholesteremia   . Hypertension   . IBS (irritable bowel syndrome)   . Murmur, heart   . Panic attacks   . PTSD (post-traumatic stress disorder)   . Sleep apnea   . Tendinitis     Past Surgical History:  Procedure Laterality Date  . ABSCESS DRAINAGE       reports that he has never smoked. He has never used smokeless tobacco. He reports that he does not drink alcohol or use drugs.  Allergies  Allergen Reactions  . Lisinopril Other (See Comments)    Dry cough  . Penicillins Other (See Comments)    CHILDHOOD ALLERGY Has patient had a PCN reaction  causing immediate rash, facial/tongue/throat swelling, SOB or lightheadedness with hypotension: unknown Has patient had a PCN reaction causing severe rash involving mucus membranes or skin necrosis:  unknown Has patient had a PCN reaction that required hospitalization:  no Has patient had a PCN reaction occurring within the last 10 years: no If all of the above answers are "NO", then may proceed with Ceph    Family History  Problem Relation Age of Onset  . Osteoporosis Mother   . Heart disease Mother   . Hypertension Mother   . Hyperlipidemia Mother   . Anemia Mother      Prior to Admission medications   Medication Sig Start Date End Date Taking? Authorizing Provider  allopurinol (ZYLOPRIM) 300 MG tablet Take 1 tablet (300 mg total) by mouth daily. 06/23/16  Yes Jacquelin HawkingMcElroy, Shannon, PA-C  ibuprofen (ADVIL,MOTRIN) 800 MG tablet Take 1 tablet (800 mg total) by mouth every 8 (eight) hours as needed for mild pain. 05/08/15  Yes Ward, Layla MawKristen N, DO  losartan (COZAAR) 50 MG tablet Take 1 tablet (50 mg total) by mouth daily. 06/23/16  Yes Jacquelin HawkingMcElroy, Shannon, PA-C  metFORMIN (GLUCOPHAGE) 1000 MG tablet Take 1,000 mg by mouth 2 (two) times daily with a meal.    Yes [provider]  Multiple Vitamins-Minerals (EMERGEN-C IMMUNE) PACK Take 1 packet by mouth daily as needed.   Yes [provider]  Omega-3 Fatty Acids (FISH OIL)  1000 MG CAPS Take 1 capsule by mouth 2 (two) times daily.    Yes [provider]  vitamin E 600 UNIT capsule Take 1,200 Units by mouth daily.   Yes [provider]    Physical Exam: Vitals:   08/11/16 1312 08/11/16 1433 08/11/16 1540 08/11/16 1759  BP:  (!) 111/57 120/71   Pulse:  87 98   Resp:  18 18   Temp: 99.6 F (37.6 C) 98.4 F (36.9 C) 99.4 F (37.4 C) 98.1 F (36.7 C)  TempSrc:  Oral  Oral  SpO2:  98% 99%   Weight:      Height:        Constitutional: NAD, calm, comfortable Vitals:   08/11/16 1312 08/11/16 1433 08/11/16 1540  08/11/16 1759  BP:  (!) 111/57 120/71   Pulse:  87 98   Resp:  18 18   Temp: 99.6 F (37.6 C) 98.4 F (36.9 C) 99.4 F (37.4 C) 98.1 F (36.7 C)  TempSrc:  Oral  Oral  SpO2:  98% 99%   Weight:      Height:       Eyes: PERRL, lids and conjunctivae normal ENMT: Mucous membranes are moist. Posterior pharynx clear of any exudate or lesions.Normal dentition.  Neck: normal, supple, no masses, no thyromegaly Respiratory: clear to auscultation bilaterally, no wheezing, no crackles. Normal respiratory effort. No accessory muscle use.  Cardiovascular: Regular rate and rhythm, no murmurs / rubs / gallops. No extremity edema. 2+ pedal pulses. No carotid bruits.  Abdomen: no tenderness, no masses palpated. No hepatosplenomegaly. Bowel sounds positive.  Musculoskeletal: no clubbing / cyanosis. No joint deformity upper and lower extremities. Good ROM, no contractures. Normal muscle tone.  Skin: Cellulitis noted on right lower extremity. No areas of induration or fluctuance. Neurologic: CN 2-12 grossly intact. Sensation intact, DTR normal. Strength 5/5 in all 4.  Psychiatric: Normal judgment and insight. Alert and oriented x 3. Normal mood.    Labs on Admission: I have personally reviewed following labs and imaging studies  CBC:  Recent Labs Lab 08/11/16 1411  WBC 23.0*  NEUTROABS 20.9*  HGB 15.0  HCT 43.2  MCV 89.6  PLT 178   Basic Metabolic Panel:  Recent Labs Lab 08/11/16 1411  NA 138  K 5.1  CL 101  CO2 28  GLUCOSE 146*  BUN 19  CREATININE 0.98  CALCIUM 9.6   GFR: Estimated Creatinine Clearance: 132.8 mL/min (by C-G formula based on SCr of 0.98 mg/dL). Liver Function Tests: No results for input(s): AST, ALT, ALKPHOS, BILITOT, PROT, ALBUMIN in the last 168 hours. No results for input(s): LIPASE, AMYLASE in the last 168 hours. No results for input(s): AMMONIA in the last 168 hours. Coagulation Profile: No results for input(s): INR, PROTIME in the last 168  hours. Cardiac Enzymes: No results for input(s): CKTOTAL, CKMB, CKMBINDEX, TROPONINI in the last 168 hours. BNP (last 3 results) No results for input(s): PROBNP in the last 8760 hours. HbA1C: No results for input(s): HGBA1C in the last 72 hours. CBG: No results for input(s): GLUCAP in the last 168 hours. Lipid Profile: No results for input(s): CHOL, HDL, LDLCALC, TRIG, CHOLHDL, LDLDIRECT in the last 72 hours. Thyroid Function Tests: No results for input(s): TSH, T4TOTAL, FREET4, T3FREE, THYROIDAB in the last 72 hours. Anemia Panel: No results for input(s): VITAMINB12, FOLATE, FERRITIN, TIBC, IRON, RETICCTPCT in the last 72 hours. Urine analysis: No results found for: COLORURINE, APPEARANCEUR, LABSPEC, PHURINE, GLUCOSEU, HGBUR, BILIRUBINUR, KETONESUR, PROTEINUR, UROBILINOGEN, NITRITE, LEUKOCYTESUR  Radiological Exams on Admission: [place] Venous Img Lower Unilateral Right  Result Date: 08/11/2016 CLINICAL DATA:  Right lower extremity swelling.  Edema.  Cellulitis. EXAM: RIGHT LOWER EXTREMITY VENOUS DOPPLER ULTRASOUND TECHNIQUE: Gray-scale sonography with graded compression, as well as color Doppler and duplex ultrasound were performed to evaluate the lower extremity deep venous systems from the level of the common femoral vein and including the common femoral, femoral, profunda femoral, popliteal and calf veins including the posterior tibial, peroneal and gastrocnemius veins when visible. The superficial great saphenous vein was also interrogated. Spectral Doppler was utilized to evaluate flow at rest and with distal augmentation maneuvers in the common femoral, femoral and popliteal veins. COMPARISON:  Left lower extremity venous Doppler 12/25/2014. FINDINGS: Contralateral Common Femoral Vein: Respiratory phasicity is normal and symmetric with the symptomatic side. No evidence of thrombus. Normal compressibility. Common Femoral Vein: No evidence of thrombus. Normal compressibility, respiratory  phasicity and response to augmentation. Saphenofemoral Junction: No evidence of thrombus. Normal compressibility and flow on color Doppler imaging. Profunda Femoral Vein: No evidence of thrombus. Normal compressibility and flow on color Doppler imaging. Femoral Vein: No evidence of thrombus. Normal compressibility, respiratory phasicity and response to augmentation. Popliteal Vein: No evidence of thrombus. Normal compressibility, respiratory phasicity and response to augmentation. Calf Veins: No evidence of thrombus. Normal compressibility and flow on color Doppler imaging. Superficial Great Saphenous Vein: No evidence of thrombus. Normal compressibility and flow on color Doppler imaging. Other Findings:  None. IMPRESSION: No evidence of DVT within the right lower extremity. Electronically Signed   By: Maisie Fushomas  Register   On: 08/11/2016 17:03    Assessment/Plan Active Problems:   Cellulitis   HTN (hypertension)   DM type 2 (diabetes mellitus, type 2) (HCC)   Morbid obesity (HCC)    1. Right lower extremity cellulitis. Continue on IV antibiotics. Will use IV clindamycin per cellulitis order set. Venous Dopplers of right lower shoulder negative for DVT. Keep lower extremity elevated. 2. Diabetes. Continue on metformin. Start sliding scale insulin. 3. Hypertension. Blood pressure currently running low. Hold ARB for now. 4. Morbid obesity. Counseled on the importance of diet and exercise.  DVT prophylaxis: Lovenox Code Status: Full code Family Communication: No family present Disposition Plan: Discharge home once improved Consults called:  Admission status: observation   Eward Rutigliano MD [facility] Pager [phone]  If 7PM-7AM, please contact night-coverage [email or web address] Password [email or web address]  08/11/2016, 6:45 PM

## 2016-08-11 NOTE — ED Provider Notes (Signed)
[facility]-EMERGENCY DEPT Provider Note   CSN: [id number] Arrival date & time: 08/11/16  1259     History   Chief Complaint Chief Complaint  Patient presents with  . Rash    HPI Nathan Velez is a 51 y.o. male.  HPI Patients with right lower extremity erythema, warmth and swelling that started this morning. States he had a small area of redness on the posterior calf which since spread since this morning. He also has had subjective fevers and chills as well as diaphoresis. States he had left lower extremity cellulitis last year which required inpatient IV antibiotics. Past Medical History:  Diagnosis Date  . Bursitis   . Cellulitis    L Leg  . Depression   . Diabetes mellitus without complication (HCC)   . Edema   . Gout   . Hypercholesteremia   . Hypertension   . IBS (irritable bowel syndrome)   . Murmur, heart   . Panic attacks   . PTSD (post-traumatic stress disorder)   . Sleep apnea   . Tendinitis     Patient Active Problem List   Diagnosis Date Noted  . Mood disorder (HCC) 05/25/2015  . Uncontrolled type 2 diabetes mellitus with complication (HCC) 05/25/2015  . Hyperlipidemia 05/25/2015  . Stasis edema 05/25/2015  . Morbid obesity (HCC) 05/09/2015  . Cellulitis 12/25/2014  . HTN (hypertension) 12/25/2014  . DM type 2 (diabetes mellitus, type 2) (HCC) 12/25/2014    Past Surgical History:  Procedure Laterality Date  . ABSCESS DRAINAGE         Home Medications    Prior to Admission medications   Medication Sig Start Date End Date Taking? Authorizing Provider  allopurinol (ZYLOPRIM) 300 MG tablet Take 1 tablet (300 mg total) by mouth daily. 06/23/16  Yes Jacquelin HawkingMcElroy, Shannon, PA-C  ibuprofen (ADVIL,MOTRIN) 800 MG tablet Take 1 tablet (800 mg total) by mouth every 8 (eight) hours as needed for mild pain. 05/08/15  Yes Ward, Layla MawKristen N, DO  losartan (COZAAR) 50 MG tablet Take 1 tablet (50 mg total) by mouth daily. 06/23/16  Yes Jacquelin HawkingMcElroy, Shannon, PA-C  metFORMIN  (GLUCOPHAGE) 1000 MG tablet Take 1,000 mg by mouth 2 (two) times daily with a meal.    Yes [provider]  Multiple Vitamins-Minerals (EMERGEN-C IMMUNE) PACK Take 1 packet by mouth daily as needed.   Yes [provider]  Omega-3 Fatty Acids (FISH OIL) 1000 MG CAPS Take 1 capsule by mouth 2 (two) times daily.    Yes [provider]  vitamin E 600 UNIT capsule Take 1,200 Units by mouth daily.   Yes [provider]    Family History Family History  Problem Relation Age of Onset  . Osteoporosis Mother   . Heart disease Mother   . Hypertension Mother   . Hyperlipidemia Mother   . Anemia Mother     Social History Social History  Substance Use Topics  . Smoking status: Never Smoker  . Smokeless tobacco: Never Used  . Alcohol use No     Allergies   Lisinopril and Penicillins   Review of Systems Review of Systems  Constitutional: Positive for chills, diaphoresis and fever.  Respiratory: Negative for cough and shortness of breath.   Cardiovascular: Negative for chest pain.  Gastrointestinal: Negative for abdominal pain, diarrhea, nausea and vomiting.  Genitourinary: Negative for dysuria, flank pain, frequency and hematuria.  Musculoskeletal: Negative for back pain, myalgias, neck pain and neck stiffness.  Skin: Positive for color change. Negative for rash  and wound.  Neurological: Negative for dizziness, weakness, light-headedness, numbness and headaches.  All other systems reviewed and are negative.    Physical Exam Updated Vital Signs BP 120/71   Pulse 98   Temp 99.4 F (37.4 C)   Resp 18   Ht 5\' 9"  (1.753 m)   Wt (!) 154.2 kg (340 lb)   SpO2 99%   BMI 50.21 kg/m   Physical Exam  Constitutional: He is oriented to person, place, and time. He appears well-developed and well-nourished.  HENT:  Head: Normocephalic and atraumatic.  Mouth/Throat: Oropharynx is clear and moist. No oropharyngeal exudate.  Eyes: Pupils are equal, round,  and reactive to light. EOM are normal.  Neck: Normal range of motion. Neck supple.  Cardiovascular: Normal rate and regular rhythm.  Exam reveals no gallop and no friction rub.   No murmur heard. Pulmonary/Chest: Effort normal and breath sounds normal. No respiratory distress. He has no wheezes. He has no rales. He exhibits no tenderness.  Abdominal: Soft. Bowel sounds are normal. There is no tenderness. There is no rebound and no guarding.  Musculoskeletal: Normal range of motion. He exhibits no edema or tenderness.  Patient has area of redness in the posterior right calf extending medially. Warm to touch. No tenderness, induration or masses appreciated. The right calf does appear to be slightly larger than the left. Distal pulses are 2+. No inguinal lymphadenopathy.  Neurological: He is alert and oriented to person, place, and time.  5/5 motor in all extremities. Sensation fully intact.  Skin: Skin is warm and dry. No rash noted. He is not diaphoretic. No erythema.  Psychiatric: He has a normal mood and affect. His behavior is normal.  Nursing note and vitals reviewed.    ED Treatments / Results  Labs (all labs ordered are listed, but only abnormal results are displayed) Labs Reviewed  CBC WITH DIFFERENTIAL/PLATELET - Abnormal; Notable for the following:       Result Value   WBC 23.0 (*)    Neutro Abs 20.9 (*)    Monocytes Absolute 1.2 (*)    All other components within normal limits  BASIC METABOLIC PANEL - Abnormal; Notable for the following:    Glucose, Bld 146 (*)    All other components within normal limits  CULTURE, BLOOD (ROUTINE X 2)  CULTURE, BLOOD (ROUTINE X 2)  I-STAT CG4 LACTIC ACID, ED    EKG  EKG Interpretation None       Radiology US Venous Img Lower Unilateral Right  Result Date: 08/11/2016 CLINICAL DATA:  Right lower extremity swelling.  Edema.  Cellulitis. EXAM: RIGHT LOWER EXTREMITY VENOUS DOPPLER ULTRASOUND TECHNIQUE: Gray-scale sonography with graded  compression, as well as color Doppler and duplex ultrasound were performed to evaluate the lower extremity deep venous systems from the level of the common femoral vein and including the common femoral, femoral, profunda femoral, popliteal and calf veins including the posterior tibial, peroneal and gastrocnemius veins when visible. The superficial great saphenous vein was also interrogated. Spectral Doppler was utilized to evaluate flow at rest and with distal augmentation maneuvers in the common femoral, femoral and popliteal veins. COMPARISON:  Left lower extremity venous Doppler 12/25/2014. FINDINGS: Contralateral Common Femoral Vein: Respiratory phasicity is normal and symmetric with the symptomatic side. No evidence of thrombus. Normal compressibility. Common Femoral Vein: No evidence of thrombus. Normal compressibility, respiratory phasicity and response to augmentation. Saphenofemoral Junction: No evidence of thrombus. Normal compressibility and flow on color Doppler imaging. Profunda Femoral Vein: No evidence  of thrombus. Normal compressibility and flow on color Doppler imaging. Femoral Vein: No evidence of thrombus. Normal compressibility, respiratory phasicity and response to augmentation. Popliteal Vein: No evidence of thrombus. Normal compressibility, respiratory phasicity and response to augmentation. Calf Veins: No evidence of thrombus. Normal compressibility and flow on color Doppler imaging. Superficial Great Saphenous Vein: No evidence of thrombus. Normal compressibility and flow on color Doppler imaging. Other Findings:  None. IMPRESSION: No evidence of DVT within the right lower extremity. Electronically Signed   By: Maisie Fushomas  Register   On: 08/11/2016 17:03    Procedures Procedures (including critical care time)  Medications Ordered in ED Medications  sodium chloride 0.9 % bolus 1,000 mL (1,000 mLs Intravenous New Bag/Given 08/11/16 1654)  vancomycin (VANCOCIN) IVPB 1000 mg/200 mL premix  (1,000 mg Intravenous New Bag/Given 08/11/16 1652)     Initial Impression / Assessment and Plan / ED Course  I have reviewed the triage vital signs and the nursing notes.  Pertinent labs & imaging results that were available during my care of the patient were reviewed by me and considered in my medical decision making (see chart for details).    Started IV fluids and vancomycin. Lactic acid is normal. Discussed with hospitalist.   Final Clinical Impressions(s) / ED Diagnoses   Final diagnoses:  Cellulitis of right lower extremity    New Prescriptions New Prescriptions   No medications on file     Loren RacerYelverton, Imogene Gravelle, MD 08/11/16 1757

## 2016-08-11 NOTE — ED Triage Notes (Signed)
Pt c/o redness and heat to right lower leg. Pt also c/o fever, chills, nausea today. Pt reports history of cellulitis and is diabetic.

## 2016-08-12 LAB — BASIC METABOLIC PANEL
ANION GAP: 9 (ref 5–15)
BUN: 14 mg/dL (ref 6–20)
CALCIUM: 8.3 mg/dL — AB (ref 8.9–10.3)
CO2: 24 mmol/L (ref 22–32)
Chloride: 101 mmol/L (ref 101–111)
Creatinine, Ser: 0.87 mg/dL (ref 0.61–1.24)
GLUCOSE: 152 mg/dL — AB (ref 65–99)
POTASSIUM: 3.6 mmol/L (ref 3.5–5.1)
Sodium: 134 mmol/L — ABNORMAL LOW (ref 135–145)

## 2016-08-12 LAB — GLUCOSE, CAPILLARY
GLUCOSE-CAPILLARY: 134 mg/dL — AB (ref 65–99)
GLUCOSE-CAPILLARY: 122 mg/dL — AB (ref 65–99)
Glucose-Capillary: 162 mg/dL — ABNORMAL HIGH (ref 65–99)
Glucose-Capillary: 119 mg/dL — ABNORMAL HIGH (ref 65–99)

## 2016-08-12 LAB — CBC
HEMATOCRIT: 39.1 % (ref 39.0–52.0)
Hemoglobin: 13.5 g/dL (ref 13.0–17.0)
MCH: 31.1 pg (ref 26.0–34.0)
MCHC: 34.5 g/dL (ref 30.0–36.0)
MCV: 90.1 fL (ref 78.0–100.0)
PLATELETS: 175 10*3/uL (ref 150–400)
RBC: 4.34 MIL/uL (ref 4.22–5.81)
RDW: 13.2 % (ref 11.5–15.5)
WBC: 13.8 10*3/uL — ABNORMAL HIGH (ref 4.0–10.5)

## 2016-08-12 LAB — HIV ANTIBODY (ROUTINE TESTING W REFLEX): HIV SCREEN 4TH GENERATION: NONREACTIVE

## 2016-08-12 MED ORDER — POTASSIUM CHLORIDE 2 MEQ/ML IV SOLN
INTRAVENOUS | Status: DC
  Administered 2016-08-12: 22:00:00 via INTRAVENOUS
  Filled 2016-08-12 (×6): qty 1000

## 2016-08-12 NOTE — Care Management Note (Signed)
Case Management Note  Patient Details  Name: Serita GritBasilakis J Mosely MRN: [id number] Date of Birth: 11/26/1965  Subjective/Objective:                  Admitted with cellulitis. Pt from home, lives with mother. He is ind with ADL's. He is uninsured. [facility] and receives assistance with medications through them. He drives himself to appointments. He has cane to use the ambulation as needed. He communicates no needs.   Action/Plan: Plan for DC home with self care. CM will follow to DC.   Expected Discharge Date:       08/14/2016           Expected Discharge Plan:  Home/Self Care  In-House Referral:  NA  Discharge planning Services  CM Consult  Post Acute Care Choice:  NA Choice offered to:  NA  Status of Service:  Completed, signed off  Malcolm MetroChildress, Davinity Fanara Demske, RN 08/12/2016, 12:49 PM

## 2016-08-12 NOTE — Progress Notes (Signed)
PROGRESS NOTE    Nathan Velez  QMV:[id number] DOB: 08/04/1965 DOA: 08/11/2016 PCP: Jacquelin HawkingMcElroy, Shannon, PA-C    Brief Narrative:  51 year old male with hypertension and diabetes admitted for right lower extremity cellulitis. Currently on intravenous antibiotics. Discharge home once cellulitis improves.   Assessment & Plan:   Active Problems:   Cellulitis   HTN (hypertension)   DM type 2 (diabetes mellitus, type 2) (HCC)   Morbid obesity (HCC)   1. Right lower extremity cellulitis. Continue on IV antibiotics. Overall cellulitis has not significantly improved yet and has extended beyond marked margins in some areas. Patient did have fever overnight. We'll continue on IV clindamycin. Venous Dopplers of right lower extremity negative for DVT. Keep lower extremity elevated. 2. Diabetes. Continue on metformin. Blood sugars stable. Continue on sliding scale insulin 3. Hypertension. ARB currently on hold. Blood pressures improving. Continue to monitor. 4. Morbid obesity. Counseled on the importance of diet and exercise.   DVT prophylaxis: Lovenox Code Status: Full code Family Communication: No family present Disposition Plan: Discharge home once improved   Consultants:     Procedures:     Antimicrobials:   Clindamycin 7/26>>    Subjective: Patient had a fever last night. He feels that his leg is still warm. Noted that erythema is extending past the marked margins in some areas  Objective: Vitals:   08/11/16 2202 08/12/16 0310 08/12/16 0600 08/12/16 1430  BP:   99/62 136/81  Pulse:   100 76  Resp:   18 20  Temp: 99.2 F (37.3 C) 100.2 F (37.9 C) 98.6 F (37 C) 99.6 F (37.6 C)  TempSrc: Oral  Oral   SpO2:   99% 100%  Weight:      Height:        Intake/Output Summary (Last 24 hours) at 08/12/16 1856 Last data filed at 08/12/16 1549  Gross per 24 hour  Intake           2287.5 ml  Output             2200 ml  Net             87.5 ml   Filed Weights   08/11/16 1308 08/11/16 2011  Weight: (!) 154.2 kg (340 lb) (!) 157.6 kg (347 lb 7.1 oz)    Examination:  General exam: Appears calm and comfortable  Respiratory system: Clear to auscultation. Respiratory effort normal. Cardiovascular system: S1 & S2 heard, RRR. No JVD, murmurs, rubs, gallops or clicks. No pedal edema. Gastrointestinal system: Abdomen is nondistended, soft and nontender. No organomegaly or masses felt. Normal bowel sounds heard. Central nervous system: Alert and oriented. No focal neurological deficits. Extremities: Symmetric 5 x 5 power. Skin: Erythema is extending beyond marked margins in some areas. Right Lower extremity is still warm to touch. Psychiatry: Judgement and insight appear normal. Mood & affect appropriate.     Data Reviewed: I have personally reviewed following labs and imaging studies  CBC:  Recent Labs Lab 08/11/16 1411 08/12/16 0520  WBC 23.0* 13.8*  NEUTROABS 20.9*  --   HGB 15.0 13.5  HCT 43.2 39.1  MCV 89.6 90.1  PLT 178 175   Basic Metabolic Panel:  Recent Labs Lab 08/11/16 1411 08/12/16 0520  NA 138 134*  K 5.1 3.6  CL 101 101  CO2 28 24  GLUCOSE 146* 152*  BUN 19 14  CREATININE 0.98 0.87  CALCIUM 9.6 8.3*   GFR: Estimated Creatinine Clearance: 151.6 mL/min (by C-G formula based  on SCr of 0.87 mg/dL). Liver Function Tests: No results for input(s): AST, ALT, ALKPHOS, BILITOT, PROT, ALBUMIN in the last 168 hours. No results for input(s): LIPASE, AMYLASE in the last 168 hours. No results for input(s): AMMONIA in the last 168 hours. Coagulation Profile: No results for input(s): INR, PROTIME in the last 168 hours. Cardiac Enzymes: No results for input(s): CKTOTAL, CKMB, CKMBINDEX, TROPONINI in the last 168 hours. BNP (last 3 results) No results for input(s): PROBNP in the last 8760 hours. HbA1C: No results for input(s): HGBA1C in the last 72 hours. CBG:  Recent Labs Lab 08/11/16 2013 08/12/16 0753 08/12/16 1137  08/12/16 1623  GLUCAP 117* 134* 162* 122*   Lipid Profile: No results for input(s): CHOL, HDL, LDLCALC, TRIG, CHOLHDL, LDLDIRECT in the last 72 hours. Thyroid Function Tests: No results for input(s): TSH, T4TOTAL, FREET4, T3FREE, THYROIDAB in the last 72 hours. Anemia Panel: No results for input(s): VITAMINB12, FOLATE, FERRITIN, TIBC, IRON, RETICCTPCT in the last 72 hours. Sepsis Labs:  Recent Labs Lab 08/11/16 1646  LATICACIDVEN 1.76    Recent Results (from the past 240 hour(s))  Culture, blood (Routine X 2) w Reflex to ID Panel     Status: None (Preliminary result)   Collection Time: 08/11/16  4:16 PM  Result Value Ref Range Status   Specimen Description RIGHT ANTECUBITAL  Final   Special Requests   Final    BOTTLES DRAWN AEROBIC AND ANAEROBIC Blood Culture adequate volume   Culture NO GROWTH < 24 HOURS  Final   Report Status PENDING  Incomplete  Culture, blood (Routine X 2) w Reflex to ID Panel     Status: None (Preliminary result)   Collection Time: 08/11/16  4:16 PM  Result Value Ref Range Status   Specimen Description BLOOD RIGHT HAND  Final   Special Requests   Final    BOTTLES DRAWN AEROBIC AND ANAEROBIC Blood Culture adequate volume   Culture NO GROWTH < 24 HOURS  Final   Report Status PENDING  Incomplete         Radiology Studies: [place] Venous Img Lower Unilateral Right  Result Date: 08/11/2016 CLINICAL DATA:  Right lower extremity swelling.  Edema.  Cellulitis. EXAM: RIGHT LOWER EXTREMITY VENOUS DOPPLER ULTRASOUND TECHNIQUE: Gray-scale sonography with graded compression, as well as color Doppler and duplex ultrasound were performed to evaluate the lower extremity deep venous systems from the level of the common femoral vein and including the common femoral, femoral, profunda femoral, popliteal and calf veins including the posterior tibial, peroneal and gastrocnemius veins when visible. The superficial great saphenous vein was also interrogated. Spectral Doppler was  utilized to evaluate flow at rest and with distal augmentation maneuvers in the common femoral, femoral and popliteal veins. COMPARISON:  Left lower extremity venous Doppler 12/25/2014. FINDINGS: Contralateral Common Femoral Vein: Respiratory phasicity is normal and symmetric with the symptomatic side. No evidence of thrombus. Normal compressibility. Common Femoral Vein: No evidence of thrombus. Normal compressibility, respiratory phasicity and response to augmentation. Saphenofemoral Junction: No evidence of thrombus. Normal compressibility and flow on color Doppler imaging. Profunda Femoral Vein: No evidence of thrombus. Normal compressibility and flow on color Doppler imaging. Femoral Vein: No evidence of thrombus. Normal compressibility, respiratory phasicity and response to augmentation. Popliteal Vein: No evidence of thrombus. Normal compressibility, respiratory phasicity and response to augmentation. Calf Veins: No evidence of thrombus. Normal compressibility and flow on color Doppler imaging. Superficial Great Saphenous Vein: No evidence of thrombus. Normal compressibility and flow on color Doppler imaging.  Other Findings:  None. IMPRESSION: No evidence of DVT within the right lower extremity. Electronically Signed   By: Maisie Fus  Register   On: 08/11/2016 17:03        Scheduled Meds: . allopurinol  300 mg Oral Daily  . enoxaparin (LOVENOX) injection  80 mg Subcutaneous Q24H  . insulin aspart  0-15 Units Subcutaneous TID WC  . insulin aspart  0-5 Units Subcutaneous QHS  . metFORMIN  1,000 mg Oral BID WC   Continuous Infusions: . sodium chloride 125 mL/hr at 08/12/16 1610  . clindamycin (CLEOCIN) IV Stopped (08/12/16 1122)     LOS: 0 days    Time spent:    Steffan Caniglia, MD [facility] Pager [phone]  If 7PM-7AM, please contact night-coverage [email or web address] Password [facility] 08/12/2016, 6:56 PM

## 2016-08-13 LAB — CBC
HCT: 39.4 % (ref 39.0–52.0)
HEMOGLOBIN: 13.6 g/dL (ref 13.0–17.0)
MCH: 31.2 pg (ref 26.0–34.0)
MCHC: 34.5 g/dL (ref 30.0–36.0)
MCV: 90.4 fL (ref 78.0–100.0)
PLATELETS: 151 10*3/uL (ref 150–400)
RBC: 4.36 MIL/uL (ref 4.22–5.81)
RDW: 13.4 % (ref 11.5–15.5)
WBC: 9.6 10*3/uL (ref 4.0–10.5)

## 2016-08-13 LAB — BASIC METABOLIC PANEL
ANION GAP: 8 (ref 5–15)
BUN: 14 mg/dL (ref 6–20)
CALCIUM: 8.3 mg/dL — AB (ref 8.9–10.3)
CO2: 26 mmol/L (ref 22–32)
Chloride: 104 mmol/L (ref 101–111)
Creatinine, Ser: 0.8 mg/dL (ref 0.61–1.24)
Glucose, Bld: 139 mg/dL — ABNORMAL HIGH (ref 65–99)
Potassium: 4.1 mmol/L (ref 3.5–5.1)
SODIUM: 138 mmol/L (ref 135–145)

## 2016-08-13 LAB — GLUCOSE, CAPILLARY
GLUCOSE-CAPILLARY: 142 mg/dL — AB (ref 65–99)
GLUCOSE-CAPILLARY: 122 mg/dL — AB (ref 65–99)
Glucose-Capillary: 157 mg/dL — ABNORMAL HIGH (ref 65–99)
Glucose-Capillary: 119 mg/dL — ABNORMAL HIGH (ref 65–99)

## 2016-08-13 MED ORDER — VANCOMYCIN HCL 10 G IV SOLR
1500.0000 mg | Freq: Two times a day (BID) | INTRAVENOUS | Status: DC
  Administered 2016-08-13 – 2016-08-14 (×3): 1500 mg via INTRAVENOUS
  Filled 2016-08-13 (×4): qty 1500

## 2016-08-13 MED ORDER — VANCOMYCIN HCL 10 G IV SOLR
2000.0000 mg | Freq: Once | INTRAVENOUS | Status: AC
  Administered 2016-08-13: 2000 mg via INTRAVENOUS
  Filled 2016-08-13: qty 2000

## 2016-08-13 MED ORDER — POTASSIUM CHLORIDE IN NACL 20-0.9 MEQ/L-% IV SOLN: INTRAVENOUS | Status: DC

## 2016-08-13 NOTE — Progress Notes (Signed)
PROGRESS NOTE    Nathan Velez  ZHY:[id number] DOB: 05/29/1965 DOA: 08/11/2016 PCP: Jacquelin HawkingMcElroy, Shannon, PA-C    Brief Narrative:  51 year old male with hypertension and diabetes admitted for right lower extremity cellulitis. Currently on intravenous antibiotics. Discharge home once cellulitis improves.   Assessment & Plan:   Active Problems:   Cellulitis   HTN (hypertension)   DM type 2 (diabetes mellitus, type 2) (HCC)   Morbid obesity (HCC)   1. Right lower extremity cellulitis. Patient is being treated with intravenous clindamycin. Despite IV antibiotics, his erythema has continued to progress. Will change clindamycin to vancomycin. Venous Dopplers of right lower extremity negative for DVT. Keep lower extremity elevated. 2. Diabetes. Continue on metformin. Blood sugars stable. Continue on sliding scale insulin 3. Hypertension. ARB currently on hold. Blood pressures stable. Continue to monitor. 4. Morbid obesity. Counseled on the importance of diet and exercise.   DVT prophylaxis: Lovenox Code Status: Full code Family Communication: No family present Disposition Plan: Discharge home once improved   Consultants:     Procedures:     Antimicrobials:   Clindamycin 7/26>> 7/28  Vancomycin 7/28>>   Subjective: Continues to have significant erythema that has not quite improved. Extends beyond the borders.  Objective: Vitals:   08/12/16 0600 08/12/16 1430 08/12/16 2149 08/13/16 0847  BP: 99/62 136/81 (!) 128/44 115/69  Pulse: 100 76 80 67  Resp: 18 20 20 20   Temp: 98.6 F (37 C) 99.6 F (37.6 C) 98.6 F (37 C) 97.7 F (36.5 C)  TempSrc: Oral  Oral Oral  SpO2: 99% 100% 94%   Weight:      Height:        Intake/Output Summary (Last 24 hours) at 08/13/16 1322 Last data filed at 08/13/16 0541  Gross per 24 hour  Intake           2402.5 ml  Output             2050 ml  Net            352.5 ml   Filed Weights   08/11/16 1308 08/11/16 2011  Weight: (!)  154.2 kg (340 lb) (!) 157.6 kg (347 lb 7.1 oz)    Examination:  General exam: Alert, awake, oriented x 3 Respiratory system: Clear to auscultation. Respiratory effort normal. Cardiovascular system:RRR. No murmurs, rubs, gallops. Gastrointestinal system: Abdomen is nondistended, soft and nontender. No organomegaly or masses felt. Normal bowel sounds heard. Central nervous system: Alert and oriented. No focal neurological deficits. Extremities: No C/C/E, +pedal pulses Skin: Erythema continues to extend beyond marked borders. Psychiatry: Judgement and insight appear normal. Mood & affect appropriate.      Data Reviewed: I have personally reviewed following labs and imaging studies  CBC:  Recent Labs Lab 08/11/16 1411 08/12/16 0520 08/13/16 0437  WBC 23.0* 13.8* 9.6  NEUTROABS 20.9*  --   --   HGB 15.0 13.5 13.6  HCT 43.2 39.1 39.4  MCV 89.6 90.1 90.4  PLT 178 175 151   Basic Metabolic Panel:  Recent Labs Lab 08/11/16 1411 08/12/16 0520 08/13/16 0437  NA 138 134* 138  K 5.1 3.6 4.1  CL 101 101 104  CO2 28 24 26   GLUCOSE 146* 152* 139*  BUN 19 14 14   CREATININE 0.98 0.87 0.80  CALCIUM 9.6 8.3* 8.3*   GFR: Estimated Creatinine Clearance: 164.8 mL/min (by C-G formula based on SCr of 0.8 mg/dL). Liver Function Tests: No results for input(s): AST, ALT, ALKPHOS, BILITOT, PROT, ALBUMIN  in the last 168 hours. No results for input(s): LIPASE, AMYLASE in the last 168 hours. No results for input(s): AMMONIA in the last 168 hours. Coagulation Profile: No results for input(s): INR, PROTIME in the last 168 hours. Cardiac Enzymes: No results for input(s): CKTOTAL, CKMB, CKMBINDEX, TROPONINI in the last 168 hours. BNP (last 3 results) No results for input(s): PROBNP in the last 8760 hours. HbA1C: No results for input(s): HGBA1C in the last 72 hours. CBG:  Recent Labs Lab 08/12/16 1137 08/12/16 1623 08/12/16 2152 08/13/16 0745 08/13/16 1128  GLUCAP 162* 122* 119*  142* 157*   Lipid Profile: No results for input(s): CHOL, HDL, LDLCALC, TRIG, CHOLHDL, LDLDIRECT in the last 72 hours. Thyroid Function Tests: No results for input(s): TSH, T4TOTAL, FREET4, T3FREE, THYROIDAB in the last 72 hours. Anemia Panel: No results for input(s): VITAMINB12, FOLATE, FERRITIN, TIBC, IRON, RETICCTPCT in the last 72 hours. Sepsis Labs:  Recent Labs Lab 08/11/16 1646  LATICACIDVEN 1.76    Recent Results (from the past 240 hour(s))  Culture, blood (Routine X 2) w Reflex to ID Panel     Status: None (Preliminary result)   Collection Time: 08/11/16  4:16 PM  Result Value Ref Range Status   Specimen Description RIGHT ANTECUBITAL  Final   Special Requests   Final    BOTTLES DRAWN AEROBIC AND ANAEROBIC Blood Culture adequate volume   Culture NO GROWTH 2 DAYS  Final   Report Status PENDING  Incomplete  Culture, blood (Routine X 2) w Reflex to ID Panel     Status: None (Preliminary result)   Collection Time: 08/11/16  4:16 PM  Result Value Ref Range Status   Specimen Description BLOOD RIGHT HAND  Final   Special Requests   Final    BOTTLES DRAWN AEROBIC AND ANAEROBIC Blood Culture adequate volume   Culture NO GROWTH 2 DAYS  Final   Report Status PENDING  Incomplete         Radiology Studies: [place] Venous Img Lower Unilateral Right  Result Date: 08/11/2016 CLINICAL DATA:  Right lower extremity swelling.  Edema.  Cellulitis. EXAM: RIGHT LOWER EXTREMITY VENOUS DOPPLER ULTRASOUND TECHNIQUE: Gray-scale sonography with graded compression, as well as color Doppler and duplex ultrasound were performed to evaluate the lower extremity deep venous systems from the level of the common femoral vein and including the common femoral, femoral, profunda femoral, popliteal and calf veins including the posterior tibial, peroneal and gastrocnemius veins when visible. The superficial great saphenous vein was also interrogated. Spectral Doppler was utilized to evaluate flow at rest and  with distal augmentation maneuvers in the common femoral, femoral and popliteal veins. COMPARISON:  Left lower extremity venous Doppler 12/25/2014. FINDINGS: Contralateral Common Femoral Vein: Respiratory phasicity is normal and symmetric with the symptomatic side. No evidence of thrombus. Normal compressibility. Common Femoral Vein: No evidence of thrombus. Normal compressibility, respiratory phasicity and response to augmentation. Saphenofemoral Junction: No evidence of thrombus. Normal compressibility and flow on color Doppler imaging. Profunda Femoral Vein: No evidence of thrombus. Normal compressibility and flow on color Doppler imaging. Femoral Vein: No evidence of thrombus. Normal compressibility, respiratory phasicity and response to augmentation. Popliteal Vein: No evidence of thrombus. Normal compressibility, respiratory phasicity and response to augmentation. Calf Veins: No evidence of thrombus. Normal compressibility and flow on color Doppler imaging. Superficial Great Saphenous Vein: No evidence of thrombus. Normal compressibility and flow on color Doppler imaging. Other Findings:  None. IMPRESSION: No evidence of DVT within the right lower extremity. Electronically Signed  ByMaisie Fus  Register   On: 08/11/2016 17:03        Scheduled Meds: . allopurinol  300 mg Oral Daily  . enoxaparin (LOVENOX) injection  80 mg Subcutaneous Q24H  . insulin aspart  0-15 Units Subcutaneous TID WC  . insulin aspart  0-5 Units Subcutaneous QHS  . metFORMIN  1,000 mg Oral BID WC   Continuous Infusions: . 0.9 % NaCl with KCl 20 mEq / L 75 mL/hr (08/13/16 0845)  . vancomycin 2,000 mg (08/13/16 1221)   Followed by  . vancomycin       LOS: 1 day    Time spent:    Kesia Dalto, MD [facility] Pager [phone]  If 7PM-7AM, please contact night-coverage [email or web address] Password [email or web address] 08/13/2016, 1:22 PM

## 2016-08-13 NOTE — Progress Notes (Signed)
Pharmacy Antibiotic Note  Nathan Velez is a 51 y.o. male admitted on 08/11/2016 with cellulitis.  Pharmacy has been consulted for Vancomycin dosing. Assessment: Per MD, Overall cellulitis has not significantly improved yet and has extended beyond marked margins in some areas. WBC improving, tmax 99.6.   Plan: Vancomycin 2000mg  loading dose, then 1500mg  IV every 12 hours.  Goal trough 10-15 mcg/mL.  F/U cxs and clinical progress Monitor V/S, labs and levels as indicated  Height: 5\' 9"  (175.3 cm) Weight: (!) 347 lb 7.1 oz (157.6 kg) IBW/kg (Calculated) : 70.7  Temp (24hrs), Avg:98.6 F (37 C), Min:97.7 F (36.5 C), Max:99.6 F (37.6 C)   Recent Labs Lab 08/11/16 1411 08/11/16 1646 08/12/16 0520 08/13/16 0437  WBC 23.0*  --  13.8* 9.6  CREATININE 0.98  --  0.87 0.80  LATICACIDVEN  --  1.76  --   --     Normalized CrCl 15612mls/min  Estimated Creatinine Clearance: 164.8 mL/min (by C-G formula based on SCr of 0.8 mg/dL).    Allergies  Allergen Reactions  . Lisinopril Other (See Comments)    Dry cough  . Penicillins Other (See Comments)    CHILDHOOD ALLERGY Has patient had a PCN reaction causing immediate rash, facial/tongue/throat swelling, SOB or lightheadedness with hypotension: unknown Has patient had a PCN reaction causing severe rash involving mucus membranes or skin necrosis:  unknown Has patient had a PCN reaction that required hospitalization:  no Has patient had a PCN reaction occurring within the last 10 years: no If all of the above answers are "NO", then may proceed with Ceph    Antimicrobials this admission: Vancomycin 7/28 >>  Clindamycin 7/26 >> 7/28 Vancomycin 1 dose on 7/26  Dose adjustments this admission: N/A Microbiology results: 7/26 BCx: ngtd  Thank you for allowing pharmacy to be a part of this patient's care.  Elder CyphersLorie Jenai Scaletta, BS Pharm D, [place] Clinical Pharmacist Pager [phone] 08/13/2016 9:48 AM

## 2016-08-14 LAB — GLUCOSE, CAPILLARY
GLUCOSE-CAPILLARY: 145 mg/dL — AB (ref 65–99)
GLUCOSE-CAPILLARY: 101 mg/dL — AB (ref 65–99)

## 2016-08-14 MED ORDER — SULFAMETHOXAZOLE-TRIMETHOPRIM 800-160 MG PO TABS: 1.0000 | ORAL_TABLET | Freq: Two times a day (BID) | ORAL | 0 refills | Status: DC

## 2016-08-14 NOTE — Discharge Summary (Signed)
Physician Discharge Summary  Nathan Velez J Dupras UJW:[id number] DOB: 05/04/1965 DOA: 08/11/2016  PCP: Jacquelin HawkingMcElroy, Shannon, PA-C  Admit date: 08/11/2016 Discharge date: 08/14/2016  Admitted From: Home Disposition:  Home  Recommendations for Outpatient Follow-up:  1. Follow up with PCP in 1-2 weeks 2. Please obtain BMP/CBC in one week  Discharge Condition:Stable CODE STATUS:Full code Diet recommendation: Heart Healthy / Carb Modified   Brief/Interim Summary: 51 year old male admitted with right lower extremity cellulitis. He was initially started on intravenous clindamycin. When overall cellulitis did not improve, he was transitioned to oral vancomycin. His fevers have since resolved. Leukocytosis has resolved. Overall cellulitis is improving. He is encouraged to keep his lower extremity elevated. He's been transitioning her course of Bactrim. He should follow-up with his primary care physician in the next week to reevaluate his cellulitis. The remainder of his medical issues remained stable.  Discharge Diagnoses:  Active Problems:   Cellulitis   HTN (hypertension)   DM type 2 (diabetes mellitus, type 2) (HCC)   Morbid obesity (HCC)    Discharge Instructions  Discharge Instructions    Diet - low sodium heart healthy    Complete by:  As directed    Increase activity slowly    Complete by:  As directed      Allergies as of 08/14/2016      Reactions   Lisinopril Other (See Comments)   Dry cough   Penicillins Other (See Comments)   CHILDHOOD ALLERGY Has patient had a PCN reaction causing immediate rash, facial/tongue/throat swelling, SOB or lightheadedness with hypotension: unknown Has patient had a PCN reaction causing severe rash involving mucus membranes or skin necrosis:  unknown Has patient had a PCN reaction that required hospitalization:  no Has patient had a PCN reaction occurring within the last 10 years: no If all of the above answers are "NO", then may proceed with Ceph       Medication List    TAKE these medications   allopurinol 300 MG tablet Commonly known as:  ZYLOPRIM Take 1 tablet (300 mg total) by mouth daily.   EMERGEN-C IMMUNE Pack Take 1 packet by mouth daily as needed.   Fish Oil 1000 MG Caps Take 1 capsule by mouth 2 (two) times daily.   ibuprofen 800 MG tablet Commonly known as:  ADVIL,MOTRIN Take 1 tablet (800 mg total) by mouth every 8 (eight) hours as needed for mild pain.   losartan 50 MG tablet Commonly known as:  COZAAR Take 1 tablet (50 mg total) by mouth daily.   metFORMIN 1000 MG tablet Commonly known as:  GLUCOPHAGE Take 1,000 mg by mouth 2 (two) times daily with a meal.   sulfamethoxazole-trimethoprim 800-160 MG tablet Commonly known as:  BACTRIM DS,SEPTRA DS Take 1 tablet by mouth 2 (two) times daily.   vitamin E 600 UNIT capsule Take 1,200 Units by mouth daily.       Allergies  Allergen Reactions  . Lisinopril Other (See Comments)    Dry cough  . Penicillins Other (See Comments)    CHILDHOOD ALLERGY Has patient had a PCN reaction causing immediate rash, facial/tongue/throat swelling, SOB or lightheadedness with hypotension: unknown Has patient had a PCN reaction causing severe rash involving mucus membranes or skin necrosis:  unknown Has patient had a PCN reaction that required hospitalization:  no Has patient had a PCN reaction occurring within the last 10 years: no If all of the above answers are "NO", then may proceed with Ceph    Consultations:  Procedures/Studies: [place] Venous Img Lower Unilateral Right  Result Date: 08/11/2016 CLINICAL DATA:  Right lower extremity swelling.  Edema.  Cellulitis. EXAM: RIGHT LOWER EXTREMITY VENOUS DOPPLER ULTRASOUND TECHNIQUE: Gray-scale sonography with graded compression, as well as color Doppler and duplex ultrasound were performed to evaluate the lower extremity deep venous systems from the level of the common femoral vein and including the common femoral,  femoral, profunda femoral, popliteal and calf veins including the posterior tibial, peroneal and gastrocnemius veins when visible. The superficial great saphenous vein was also interrogated. Spectral Doppler was utilized to evaluate flow at rest and with distal augmentation maneuvers in the common femoral, femoral and popliteal veins. COMPARISON:  Left lower extremity venous Doppler 12/25/2014. FINDINGS: Contralateral Common Femoral Vein: Respiratory phasicity is normal and symmetric with the symptomatic side. No evidence of thrombus. Normal compressibility. Common Femoral Vein: No evidence of thrombus. Normal compressibility, respiratory phasicity and response to augmentation. Saphenofemoral Junction: No evidence of thrombus. Normal compressibility and flow on color Doppler imaging. Profunda Femoral Vein: No evidence of thrombus. Normal compressibility and flow on color Doppler imaging. Femoral Vein: No evidence of thrombus. Normal compressibility, respiratory phasicity and response to augmentation. Popliteal Vein: No evidence of thrombus. Normal compressibility, respiratory phasicity and response to augmentation. Calf Veins: No evidence of thrombus. Normal compressibility and flow on color Doppler imaging. Superficial Great Saphenous Vein: No evidence of thrombus. Normal compressibility and flow on color Doppler imaging. Other Findings:  None. IMPRESSION: No evidence of DVT within the right lower extremity. Electronically Signed   By: Maisie Fushomas  Register   On: 08/11/2016 17:03       Subjective: Feels the erythema is improving today.  Discharge Exam: Vitals:   08/14/16 0516 08/14/16 1405  BP: 123/61 125/70  Pulse: 79 75  Resp: 20 16  Temp: 98.2 F (36.8 C) 98.3 F (36.8 C)   Vitals:   08/13/16 1541 08/13/16 2059 08/14/16 0516 08/14/16 1405  BP: 119/71 128/67 123/61 125/70  Pulse: 75 76 79 75  Resp: 20 20 20 16   Temp: 98.6 F (37 C) 98.8 F (37.1 C) 98.2 F (36.8 C) 98.3 F (36.8 C)   TempSrc:  Oral Oral Oral  SpO2: 98% 97% 98% 93%  Weight:      Height:        General: Pt is alert, awake, not in acute distress Cardiovascular: RRR, S1/S2 +, no rubs, no gallops Respiratory: CTA bilaterally, no wheezing, no rhonchi Abdominal: Soft, NT, ND, bowel sounds + Extremities: erythema in right lower extremity improving    The results of significant diagnostics from this hospitalization (including imaging, microbiology, ancillary and laboratory) are listed below for reference.     Microbiology: Recent Results (from the past 240 hour(s))  Culture, blood (Routine X 2) w Reflex to ID Panel     Status: None (Preliminary result)   Collection Time: 08/11/16  4:16 PM  Result Value Ref Range Status   Specimen Description RIGHT ANTECUBITAL  Final   Special Requests   Final    BOTTLES DRAWN AEROBIC AND ANAEROBIC Blood Culture adequate volume   Culture NO GROWTH 3 DAYS  Final   Report Status PENDING  Incomplete  Culture, blood (Routine X 2) w Reflex to ID Panel     Status: None (Preliminary result)   Collection Time: 08/11/16  4:16 PM  Result Value Ref Range Status   Specimen Description BLOOD RIGHT HAND  Final   Special Requests   Final    BOTTLES DRAWN AEROBIC AND ANAEROBIC Blood  Culture adequate volume   Culture NO GROWTH 3 DAYS  Final   Report Status PENDING  Incomplete     Labs: BNP (last 3 results) No results for input(s): BNP in the last 8760 hours. Basic Metabolic Panel:  Recent Labs Lab 08/11/16 1411 08/12/16 0520 08/13/16 0437  NA 138 134* 138  K 5.1 3.6 4.1  CL 101 101 104  CO2 28 24 26   GLUCOSE 146* 152* 139*  BUN 19 14 14   CREATININE 0.98 0.87 0.80  CALCIUM 9.6 8.3* 8.3*   Liver Function Tests: No results for input(s): AST, ALT, ALKPHOS, BILITOT, PROT, ALBUMIN in the last 168 hours. No results for input(s): LIPASE, AMYLASE in the last 168 hours. No results for input(s): AMMONIA in the last 168 hours. CBC:  Recent Labs Lab 08/11/16 1411  08/12/16 0520 08/13/16 0437  WBC 23.0* 13.8* 9.6  NEUTROABS 20.9*  --   --   HGB 15.0 13.5 13.6  HCT 43.2 39.1 39.4  MCV 89.6 90.1 90.4  PLT 178 175 151   Cardiac Enzymes: No results for input(s): CKTOTAL, CKMB, CKMBINDEX, TROPONINI in the last 168 hours. BNP: Invalid input(s): POCBNP CBG:  Recent Labs Lab 08/13/16 1128 08/13/16 1628 08/13/16 2055 08/14/16 0755 08/14/16 1605  GLUCAP 157* 122* 119* 145* 101*   D-Dimer No results for input(s): DDIMER in the last 72 hours. Hgb A1c No results for input(s): HGBA1C in the last 72 hours. Lipid Profile No results for input(s): CHOL, HDL, LDLCALC, TRIG, CHOLHDL, LDLDIRECT in the last 72 hours. Thyroid function studies No results for input(s): TSH, T4TOTAL, T3FREE, THYROIDAB in the last 72 hours.  Invalid input(s): FREET3 Anemia work up No results for input(s): VITAMINB12, FOLATE, FERRITIN, TIBC, IRON, RETICCTPCT in the last 72 hours. Urinalysis No results found for: COLORURINE, APPEARANCEUR, LABSPEC, PHURINE, GLUCOSEU, HGBUR, BILIRUBINUR, KETONESUR, PROTEINUR, UROBILINOGEN, NITRITE, LEUKOCYTESUR Sepsis Labs Invalid input(s): PROCALCITONIN,  WBC,  LACTICIDVEN Microbiology Recent Results (from the past 240 hour(s))  Culture, blood (Routine X 2) w Reflex to ID Panel     Status: None (Preliminary result)   Collection Time: 08/11/16  4:16 PM  Result Value Ref Range Status   Specimen Description RIGHT ANTECUBITAL  Final   Special Requests   Final    BOTTLES DRAWN AEROBIC AND ANAEROBIC Blood Culture adequate volume   Culture NO GROWTH 3 DAYS  Final   Report Status PENDING  Incomplete  Culture, blood (Routine X 2) w Reflex to ID Panel     Status: None (Preliminary result)   Collection Time: 08/11/16  4:16 PM  Result Value Ref Range Status   Specimen Description BLOOD RIGHT HAND  Final   Special Requests   Final    BOTTLES DRAWN AEROBIC AND ANAEROBIC Blood Culture adequate volume   Culture NO GROWTH 3 DAYS  Final   Report  Status PENDING  Incomplete     Time coordinating discharge: Over 30 minutes  SIGNED:   Erick Blinks, MD  [facility] 08/14/2016, 4:24 PM Pager   If 7PM-7AM, please contact night-coverage [email or web address] Password [email or web address]

## 2016-08-15 LAB — GLUCOSE, CAPILLARY: Glucose-Capillary: 156 mg/dL — ABNORMAL HIGH (ref 65–99)

## 2016-08-16 LAB — CULTURE, BLOOD (ROUTINE X 2)
CULTURE: NO GROWTH
Culture: NO GROWTH
SPECIAL REQUESTS: ADEQUATE
Special Requests: ADEQUATE

## 2016-09-12 ENCOUNTER — Ambulatory Visit: Payer: Self-pay | Admitting: Physician Assistant

## 2016-09-13 ENCOUNTER — Encounter: Payer: Self-pay | Admitting: Physician Assistant

## 2016-09-13 ENCOUNTER — Ambulatory Visit: Payer: Self-pay | Admitting: Physician Assistant

## 2016-09-13 VITALS — BP 114/66 | HR 75 | Temp 97.7°F | Wt 347.8 lb

## 2016-09-13 DIAGNOSIS — Z6841 Body Mass Index (BMI) 40.0 and over, adult: Secondary | ICD-10-CM

## 2016-09-13 DIAGNOSIS — E119 Type 2 diabetes mellitus without complications: Secondary | ICD-10-CM

## 2016-09-13 DIAGNOSIS — E785 Hyperlipidemia, unspecified: Secondary | ICD-10-CM

## 2016-09-13 DIAGNOSIS — I10 Essential (primary) hypertension: Secondary | ICD-10-CM

## 2016-09-13 NOTE — Progress Notes (Signed)
BP 114/66 (BP Location: Left Arm, Patient Position: Sitting, Cuff Size: Large)   Pulse 75   Temp 97.7 F (36.5 C)   Wt (!) 347 lb 12 oz (157.7 kg)   SpO2 97%   BMI 51.35 kg/m    Subjective:    Patient ID: Nathan Velez, male    DOB: [birth year], 51 y.o.   MRN: [id number]  HPI: Nathan Velez is a 51 y.o. male presenting on 09/13/2016 for Follow-up (from ER visit)   HPI   Pt was in hospital x 3 day for cellulitis with discharge 08/14/16.  Pt says LE are good.  Pt has been checking his bs at home and says that they are good.  Pt weight up 20 pounds fron [facility]  Today we discovered that pt had [employer] from January until April.  He says he didn't know.      Relevant past medical, surgical, family and social history reviewed and updated as indicated. Interim medical history since our last visit reviewed. Allergies and medications reviewed and updated.   Current Outpatient Prescriptions:  .  allopurinol (ZYLOPRIM) 300 MG tablet, Take 1 tablet (300 mg total) by mouth daily., Disp: 30 tablet, Rfl: 2 .  ibuprofen (ADVIL,MOTRIN) 800 MG tablet, Take 1 tablet (800 mg total) by mouth every 8 (eight) hours as needed for mild pain., Disp: 30 tablet, Rfl: 0 .  losartan (COZAAR) 50 MG tablet, Take 1 tablet (50 mg total) by mouth daily., Disp: 30 tablet, Rfl: 2 .  metFORMIN (GLUCOPHAGE) 1000 MG tablet, Take 1,000 mg by mouth 2 (two) times daily with a meal. , Disp: , Rfl:  .  Multiple Vitamins-Minerals (EMERGEN-C IMMUNE) PACK, Take 1 packet by mouth daily as needed., Disp: , Rfl:  .  Omega-3 Fatty Acids (FISH OIL) 1000 MG CAPS, Take 1 capsule by mouth 2 (two) times daily. , Disp: , Rfl:  .  vitamin E 600 UNIT capsule, Take 1,200 Units by mouth daily., Disp: , Rfl:    Review of Systems  Constitutional: Negative for appetite change, chills, diaphoresis, fatigue, fever and unexpected weight change.  HENT: Negative for congestion, dental problem, drooling, ear pain, facial swelling, hearing  loss, mouth sores, sneezing, sore throat, trouble swallowing and voice change.   Eyes: Negative for pain, discharge, redness, itching and visual disturbance.  Respiratory: Negative for cough, choking, shortness of breath and wheezing.   Cardiovascular: Negative for chest pain, palpitations and leg swelling.  Gastrointestinal: Negative for abdominal pain, blood in stool, constipation, diarrhea and vomiting.  Endocrine: Negative for cold intolerance, heat intolerance and polydipsia.  Genitourinary: Negative for decreased urine volume, dysuria and hematuria.  Musculoskeletal: Negative for arthralgias, back pain and gait problem.  Skin: Negative for rash.  Allergic/Immunologic: Negative for environmental allergies.  Neurological: Negative for seizures, syncope, light-headedness and headaches.  Hematological: Negative for adenopathy.  Psychiatric/Behavioral: Negative for agitation, dysphoric mood and suicidal ideas. The patient is not nervous/anxious.     Per HPI unless specifically indicated above     Objective:    BP 114/66 (BP Location: Left Arm, Patient Position: Sitting, Cuff Size: Large)   Pulse 75   Temp 97.7 F (36.5 C)   Wt (!) 347 lb 12 oz (157.7 kg)   SpO2 97%   BMI 51.35 kg/m   Wt Readings from Last 3 Encounters:  09/13/16 (!) 347 lb 12 oz (157.7 kg)  08/11/16 (!) 347 lb 7.1 oz (157.6 kg)  06/27/16 (!) 343 lb (155.6 kg)    Physical  Exam  Constitutional: He is oriented to person, place, and time. He appears well-developed and well-nourished.  HENT:  Head: Normocephalic and atraumatic.  Neck: Neck supple.  Cardiovascular: Normal rate and regular rhythm.   Pulmonary/Chest: Effort normal and breath sounds normal. He has no wheezes.  Abdominal: Soft. Bowel sounds are normal. There is no hepatosplenomegaly. There is no tenderness.  Musculoskeletal: He exhibits no edema.  Lymphadenopathy:    He has no cervical adenopathy.  Neurological: He is alert and oriented to person,  place, and time.  Skin: Skin is warm and dry.  Psychiatric: He has a normal mood and affect. His behavior is normal.  Vitals reviewed.        Assessment & Plan:    Encounter Diagnoses  Name Primary?  . Type 2 diabetes mellitus without complication, without long-term current use of insulin (HCC) Yes  . Essential hypertension   . Hyperlipidemia, unspecified hyperlipidemia type   . Morbid obesity, unspecified obesity type (HCC)   . BMI 50.0-59.9, adult (HCC)     -pt to Get fasting labs. Will call pt with results -pt was Given iFOBT for colon cancer screening -pt to Continue current meds -Counseled weight management -pt to Follow up 3 months.  RTO sooner prn

## 2016-09-26 ENCOUNTER — Other Ambulatory Visit: Payer: Self-pay | Admitting: Physician Assistant

## 2016-10-13 ENCOUNTER — Other Ambulatory Visit: Payer: Self-pay | Admitting: Physician Assistant

## 2016-10-13 ENCOUNTER — Other Ambulatory Visit (HOSPITAL_COMMUNITY)
Admission: RE | Admit: 2016-10-13 | Discharge: 2016-10-13 | Disposition: A | Discharge status: Home / Self Care | Payer: Self-pay | Source: Ambulatory Visit | Attending: Physician Assistant | Admitting: Physician Assistant

## 2016-10-13 DIAGNOSIS — I10 Essential (primary) hypertension: Secondary | ICD-10-CM | POA: Insufficient documentation

## 2016-10-13 DIAGNOSIS — E785 Hyperlipidemia, unspecified: Secondary | ICD-10-CM | POA: Insufficient documentation

## 2016-10-13 DIAGNOSIS — E119 Type 2 diabetes mellitus without complications: Secondary | ICD-10-CM | POA: Insufficient documentation

## 2016-10-13 LAB — COMPREHENSIVE METABOLIC PANEL
ALBUMIN: 4.2 g/dL (ref 3.5–5.0)
ALK PHOS: 48 U/L (ref 38–126)
ALT: 17 U/L (ref 17–63)
AST: 20 U/L (ref 15–41)
Anion gap: 11 (ref 5–15)
BUN: 17 mg/dL (ref 6–20)
CALCIUM: 9.1 mg/dL (ref 8.9–10.3)
CHLORIDE: 100 mmol/L — AB (ref 101–111)
CO2: 24 mmol/L (ref 22–32)
CREATININE: 0.77 mg/dL (ref 0.61–1.24)
GFR calc non Af Amer: >60 mL/min (ref >60)
GLUCOSE: 169 mg/dL — AB (ref 65–99)
Potassium: 4.3 mmol/L (ref 3.5–5.1)
SODIUM: 135 mmol/L (ref 135–145)
Total Bilirubin: 1.4 mg/dL — ABNORMAL HIGH (ref 0.3–1.2)
Total Protein: 7.4 g/dL (ref 6.5–8.1)

## 2016-10-13 LAB — LIPID PANEL
Cholesterol: 176 mg/dL (ref 0–200)
HDL: 36 mg/dL — AB (ref >40)
LDL CALC: 118 mg/dL — AB (ref 0–99)
TRIGLYCERIDES: 108 mg/dL (ref <150)
Total CHOL/HDL Ratio: 4.9 RATIO
VLDL: 22 mg/dL (ref 0–40)

## 2016-10-13 LAB — HEMOGLOBIN A1C
Hgb A1c MFr Bld: 5.9 % — ABNORMAL HIGH (ref 4.8–5.6)
MEAN PLASMA GLUCOSE: 122.63 mg/dL

## 2016-10-13 MED ORDER — METFORMIN HCL 1000 MG PO TABS: 1000.0000 mg | ORAL_TABLET | Freq: Two times a day (BID) | ORAL | 3 refills | Status: DC

## 2016-10-14 LAB — MICROALBUMIN, URINE: Microalb, Ur: 9.4 ug/mL — ABNORMAL HIGH

## 2016-12-14 ENCOUNTER — Encounter: Payer: Self-pay | Admitting: Physician Assistant

## 2016-12-14 ENCOUNTER — Ambulatory Visit: Payer: Self-pay | Admitting: Physician Assistant

## 2016-12-14 ENCOUNTER — Other Ambulatory Visit (HOSPITAL_COMMUNITY)
Admission: RE | Admit: 2016-12-14 | Discharge: 2016-12-14 | Disposition: A | Discharge status: Home / Self Care | Payer: Self-pay | Source: Ambulatory Visit | Attending: Physician Assistant | Admitting: Physician Assistant

## 2016-12-14 VITALS — BP 118/74 | HR 71 | Temp 97.7°F | Ht 69.75 in | Wt 364.5 lb

## 2016-12-14 DIAGNOSIS — Z6841 Body Mass Index (BMI) 40.0 and over, adult: Secondary | ICD-10-CM

## 2016-12-14 DIAGNOSIS — E785 Hyperlipidemia, unspecified: Secondary | ICD-10-CM

## 2016-12-14 DIAGNOSIS — E119 Type 2 diabetes mellitus without complications: Secondary | ICD-10-CM | POA: Insufficient documentation

## 2016-12-14 DIAGNOSIS — Z125 Encounter for screening for malignant neoplasm of prostate: Secondary | ICD-10-CM | POA: Insufficient documentation

## 2016-12-14 DIAGNOSIS — I10 Essential (primary) hypertension: Secondary | ICD-10-CM

## 2016-12-14 LAB — HEMOGLOBIN A1C
Hgb A1c MFr Bld: 6.8 % — ABNORMAL HIGH (ref 4.8–5.6)
Mean Plasma Glucose: 148.46 mg/dL

## 2016-12-14 LAB — BASIC METABOLIC PANEL
ANION GAP: 11 (ref 5–15)
BUN: 21 mg/dL — ABNORMAL HIGH (ref 6–20)
CALCIUM: 9.3 mg/dL (ref 8.9–10.3)
CHLORIDE: 99 mmol/L — AB (ref 101–111)
CO2: 24 mmol/L (ref 22–32)
Creatinine, Ser: 0.83 mg/dL (ref 0.61–1.24)
GFR calc Af Amer: >60 mL/min (ref >60)
GFR calc non Af Amer: >60 mL/min (ref >60)
GLUCOSE: 145 mg/dL — AB (ref 65–99)
Potassium: 4.5 mmol/L (ref 3.5–5.1)
Sodium: 134 mmol/L — ABNORMAL LOW (ref 135–145)

## 2016-12-14 LAB — PSA: Prostatic Specific Antigen: 0.7 ng/mL (ref 0.00–4.00)

## 2016-12-14 MED ORDER — LOSARTAN POTASSIUM 50 MG PO TABS: 50.0000 mg | ORAL_TABLET | Freq: Every day | ORAL | 2 refills | Status: DC

## 2016-12-14 MED ORDER — ALLOPURINOL 300 MG PO TABS: 300.0000 mg | ORAL_TABLET | Freq: Every day | ORAL | 2 refills | Status: DC

## 2016-12-14 NOTE — Progress Notes (Signed)
BP 118/74 (BP Location: Left Arm, Patient Position: Sitting, Cuff Size: Large)   Pulse 71   Temp 97.7 F (36.5 C)   Ht 5' 9.75" (1.772 m)   Wt (!) 364 lb 8 oz (165.3 kg)   SpO2 97%   BMI 52.68 kg/m    Subjective:    Patient ID: Nathan Velez, male    DOB: 09/17/1965, 51 y.o.   MRN: [id number]  HPI: Nathan Velez is a 51 y.o. male presenting on 12/14/2016 for Diabetes; Hypertension; and Hyperlipidemia   HPI    -Pt is doing well.  He is still working at [employer] -His Weight at March OV was 327 -Pt is starting full time work next month and hopes to have insurance starting in January.    Relevant past medical, surgical, family and social history reviewed and updated as indicated. Interim medical history since our last visit reviewed. Allergies and medications reviewed and updated.   Current Outpatient Medications:  .  allopurinol (ZYLOPRIM) 300 MG tablet, TAKE 1 TABLET BY MOUTH ONCE DAILY, Disp: 30 tablet, Rfl: 2 .  ibuprofen (ADVIL,MOTRIN) 800 MG tablet, Take 1 tablet (800 mg total) by mouth every 8 (eight) hours as needed for mild pain., Disp: 30 tablet, Rfl: 0 .  losartan (COZAAR) 50 MG tablet, TAKE 1 TABLET BY MOUTH ONCE DAILY, Disp: 30 tablet, Rfl: 2 .  metFORMIN (GLUCOPHAGE) 1000 MG tablet, Take 1 tablet (1,000 mg total) by mouth 2 (two) times daily with a meal., Disp: 60 tablet, Rfl: 3 .  Multiple Vitamins-Minerals (EMERGEN-C IMMUNE) PACK, Take 1 packet by mouth daily as needed., Disp: , Rfl:  .  Omega-3 Fatty Acids (FISH OIL) 1000 MG CAPS, Take 1 capsule by mouth 2 (two) times daily. , Disp: , Rfl:  .  vitamin E 600 UNIT capsule, Take 1,200 Units by mouth daily., Disp: , Rfl:   Review of Systems  Constitutional: Negative for appetite change, chills, diaphoresis, fatigue, fever and unexpected weight change.  HENT: Negative for congestion, dental problem, drooling, ear pain, facial swelling, hearing loss, mouth sores, sneezing, sore throat, trouble swallowing  and voice change.   Eyes: Negative for pain, discharge, redness, itching and visual disturbance.  Respiratory: Negative for cough, choking, shortness of breath and wheezing.   Cardiovascular: Negative for chest pain, palpitations and leg swelling.  Gastrointestinal: Negative for abdominal pain, blood in stool, constipation, diarrhea and vomiting.  Endocrine: Negative for cold intolerance, heat intolerance and polydipsia.  Genitourinary: Negative for decreased urine volume, dysuria and hematuria.  Musculoskeletal: Negative for arthralgias, back pain and gait problem.  Skin: Negative for rash.  Allergic/Immunologic: Negative for environmental allergies.  Neurological: Negative for seizures, syncope, light-headedness and headaches.  Hematological: Negative for adenopathy.  Psychiatric/Behavioral: Negative for agitation, dysphoric mood and suicidal ideas. The patient is not nervous/anxious.     Per HPI unless specifically indicated above     Objective:    BP 118/74 (BP Location: Left Arm, Patient Position: Sitting, Cuff Size: Large)   Pulse 71   Temp 97.7 F (36.5 C)   Ht 5' 9.75" (1.772 m)   Wt (!) 364 lb 8 oz (165.3 kg)   SpO2 97%   BMI 52.68 kg/m   Wt Readings from Last 3 Encounters:  12/14/16 (!) 364 lb 8 oz (165.3 kg)  09/13/16 (!) 347 lb 12 oz (157.7 kg)  08/11/16 (!) 347 lb 7.1 oz (157.6 kg)    Physical Exam  Constitutional: He is oriented to person, place, and time.  He appears well-developed and well-nourished.  HENT:  Head: Normocephalic and atraumatic.  Neck: Neck supple.  Cardiovascular: Normal rate and regular rhythm.  Pulmonary/Chest: Effort normal and breath sounds normal. He has no wheezes.  Abdominal: Soft. Bowel sounds are normal. There is no hepatosplenomegaly. There is no tenderness.  Musculoskeletal: He exhibits no edema.  LE look good- no edema, no redness  Lymphadenopathy:    He has no cervical adenopathy.  Neurological: He is alert and oriented to  person, place, and time.  Skin: Skin is warm and dry.  Psychiatric: He has a normal mood and affect. His behavior is normal.  Vitals reviewed.       Assessment & Plan:   Encounter Diagnoses  Name Primary?  . Type 2 diabetes mellitus without complication, without long-term current use of insulin (HCC) Yes  . Essential hypertension   . Hyperlipidemia, unspecified hyperlipidemia type   . Morbid obesity, unspecified obesity type (HCC)   . BMI 50.0-59.9, adult (HCC)   . Screening for prostate cancer      -Pt to get labs drawn today or tomorrow- A1c, bmp, PSA.  We will call him with results -discussed colon cancer screening.  Will wait at this time as pt will be getting insurance in January and can have colonoscopy.  He is in agreement with that -counseled pt on weight.  He has gained 37 pounds since March -pt to follow up in 3 months.  He will notify us if he gets his insurance.  RTO sooner prn

## 2017-02-21 ENCOUNTER — Other Ambulatory Visit: Payer: Self-pay | Admitting: Physician Assistant

## 2017-03-20 ENCOUNTER — Ambulatory Visit: Payer: Self-pay | Admitting: Physician Assistant

## 2017-04-05 ENCOUNTER — Other Ambulatory Visit: Payer: Self-pay | Admitting: Physician Assistant

## 2017-04-23 ENCOUNTER — Other Ambulatory Visit: Payer: Self-pay | Admitting: Physician Assistant

## 2018-07-30 ENCOUNTER — Other Ambulatory Visit: Payer: Self-pay | Admitting: *Deleted

## 2018-07-30 DIAGNOSIS — Z20822 Contact with and (suspected) exposure to covid-19: Secondary | ICD-10-CM

## 2018-08-03 LAB — NOVEL CORONAVIRUS, NAA: SARS-CoV-2, NAA: NOT DETECTED

## 2019-06-12 ENCOUNTER — Encounter (INDEPENDENT_AMBULATORY_CARE_PROVIDER_SITE_OTHER): Payer: Self-pay | Admitting: Nurse Practitioner

## 2019-06-12 ENCOUNTER — Ambulatory Visit (HOSPITAL_COMMUNITY)
Admission: RE | Admit: 2019-06-12 | Discharge: 2019-06-12 | Disposition: A | Discharge status: Home / Self Care | Payer: 59 | Source: Ambulatory Visit | Attending: Nurse Practitioner | Admitting: Nurse Practitioner

## 2019-06-12 ENCOUNTER — Ambulatory Visit (INDEPENDENT_AMBULATORY_CARE_PROVIDER_SITE_OTHER): Payer: 59 | Admitting: Nurse Practitioner

## 2019-06-12 ENCOUNTER — Other Ambulatory Visit: Payer: Self-pay

## 2019-06-12 VITALS — BP 135/70 | HR 93 | Temp 98.6°F | Ht 69.7 in | Wt 363.6 lb

## 2019-06-12 DIAGNOSIS — I10 Essential (primary) hypertension: Secondary | ICD-10-CM | POA: Diagnosis not present

## 2019-06-12 DIAGNOSIS — M25562 Pain in left knee: Secondary | ICD-10-CM

## 2019-06-12 DIAGNOSIS — M25561 Pain in right knee: Secondary | ICD-10-CM

## 2019-06-12 DIAGNOSIS — M25511 Pain in right shoulder: Secondary | ICD-10-CM | POA: Diagnosis present

## 2019-06-12 DIAGNOSIS — R911 Solitary pulmonary nodule: Secondary | ICD-10-CM

## 2019-06-12 DIAGNOSIS — E785 Hyperlipidemia, unspecified: Secondary | ICD-10-CM

## 2019-06-12 DIAGNOSIS — M25512 Pain in left shoulder: Secondary | ICD-10-CM | POA: Diagnosis present

## 2019-06-12 DIAGNOSIS — E119 Type 2 diabetes mellitus without complications: Secondary | ICD-10-CM | POA: Diagnosis not present

## 2019-06-12 DIAGNOSIS — G4730 Sleep apnea, unspecified: Secondary | ICD-10-CM

## 2019-06-12 DIAGNOSIS — G8929 Other chronic pain: Secondary | ICD-10-CM | POA: Diagnosis present

## 2019-06-12 DIAGNOSIS — R5383 Other fatigue: Secondary | ICD-10-CM

## 2019-06-12 MED ORDER — MELOXICAM 7.5 MG PO TABS: 7.5000 mg | ORAL_TABLET | Freq: Every day | ORAL | 3 refills | Status: DC | PRN

## 2019-06-12 MED ORDER — METFORMIN HCL 1000 MG PO TABS: 1000.0000 mg | ORAL_TABLET | Freq: Two times a day (BID) | ORAL | 0 refills | Status: DC

## 2019-06-12 NOTE — Progress Notes (Signed)
Subjective:  Patient ID: Nathan Velez, male    DOB: April 20, 1965  Age: 54 y.o. MRN: [id number]  CC:  Chief Complaint  Patient presents with  . Establish Care  . Diabetes  . Hypertension  . Knee Pain  . Other    Obesity, bilateral shoulder pain, history of lung nodules      HPI  This patient arrives today to establish care.  He is a 54 year old male with medical history significant for hypertension, hyperlipidemia, type 2 diabetes, sleep apnea, and morbid obesity.  He tells me his previous provider has retired and that is why he is coming here to establish care.  His main concerns include the above.  Diabetes: He has a history of type 2 diabetes and is currently taking Metformin 1000 mg by mouth twice a day.  He tells me that his blood sugars run high at home and they range from 1 60-300 throughout the day.  He is wondering what else he can take to control his diabetes.  Hypertension: He also has a history of hypertension and continues on losartan daily.  Bilateral knee pain: He tells me he has been having bilateral knee pain for approximately 5 years.  He tells me it seems to have gotten progressively worse here over the last year, and is affecting his ability to work.  He works at [employer] and is on his feet for many hours at a time.  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  He does feel like his right kneecap is close to "giving out" sometimes.  He has taken Tylenol and ibuprofen without improvement in his symptoms.  Obesity: He also is obese and is interested in trying to lose weight.  He would like to discuss lifestyle options for this.  Bilateral shoulder pain: Tells me proximally 1 year ago he fell at work, and ever since then he has had bilateral shoulder pain.  He is wondering if he can have imaging of this completed today.  History of lung nodules: A while ago he was told he had "spots" on his lungs.  He tells  me he was told that he may need follow-up imaging, but this is never occurred mostly due to the fact that he has had to change doctors fairly frequently and the most recent past.  He is wondering if he can have imaging done of his lungs to follow-up on this.   Past Medical History:  Diagnosis Date  . Bursitis   . Cellulitis    L Leg  . Depression   . Diabetes mellitus without complication ([place])   . Edema   . Gout   . Hypercholesteremia   . Hypertension   . IBS (irritable bowel syndrome)   . Murmur, heart   . Panic attacks   . PTSD (post-traumatic stress disorder)   . Sleep apnea   . Sleep apnea   . Tendinitis       Family History  Problem Relation Age of Onset  . Osteoporosis Mother   . Heart disease Mother   . Hypertension Mother   . Hyperlipidemia Mother   . Anemia Mother     Social History   Social History Narrative  . Not on file   Social History   Tobacco Use  . Smoking status: Never Smoker  . Smokeless tobacco: Never Used  Substance Use Topics  . Alcohol use: No  Current Meds  Medication Sig  . Acetaminophen (TYLENOL) 325 MG CAPS Take 650 mg by mouth every 6 (six) hours as needed.  Marland Kitchen ibuprofen (ADVIL,MOTRIN) 800 MG tablet Take 1 tablet (800 mg total) by mouth every 8 (eight) hours as needed for mild pain.  Marland Kitchen losartan (COZAAR) 50 MG tablet Take 1 tablet (50 mg total) by mouth daily.  . metFORMIN (GLUCOPHAGE) 1000 MG tablet Take 1 tablet (1,000 mg total) by mouth 2 (two) times daily.  . Multiple Vitamins-Minerals (EMERGEN-C IMMUNE) PACK Take 1 packet by mouth daily as needed.  . Omega-3 Fatty Acids (FISH OIL) 1000 MG CAPS Take 1 capsule by mouth 2 (two) times daily.   . vitamin E 600 UNIT capsule Take 1,200 Units by mouth daily.  . [DISCONTINUED] metFORMIN (GLUCOPHAGE) 500 MG tablet Take 1,000 mg by mouth 2 (two) times daily.    ROS:  Review of Systems  Constitutional: Positive for malaise/fatigue.  Eyes: Negative.   Respiratory: Negative.     Cardiovascular: Negative.   Musculoskeletal: Positive for joint pain.  Neurological: Negative.      Objective:   Today's Vitals: BP 135/70 (BP Location: Left Arm, Patient Position: Sitting, Cuff Size: Normal)   Pulse 93   Temp 98.6 F (37 C) (Temporal)   Ht 5' 9.7" (1.77 m)   Wt (!) 363 lb 9.6 oz (164.9 kg)   SpO2 93%   BMI 52.62 kg/m  Vitals with BMI 06/12/2019 06/12/2019 12/14/2016  Height - 5' 9.7" 5' 9.75"  Weight - 363 lbs 10 oz 364 lbs 8 oz  BMI - 37.10 62.69  Systolic - 485 462  Diastolic - 70 74  Pulse 93 38 71     Physical Exam Vitals reviewed.  Constitutional:      Appearance: Normal appearance.  HENT:     Head: Normocephalic and atraumatic.  Cardiovascular:     Rate and Rhythm: Normal rate and regular rhythm.  Pulmonary:     Effort: Pulmonary effort is normal.     Breath sounds: Normal breath sounds.  Musculoskeletal:     Cervical back: Neck supple.     Right knee: No swelling, deformity, effusion, erythema, ecchymosis, bony tenderness or crepitus. Normal range of motion.     Left knee: Crepitus present. No swelling, deformity, effusion, erythema, ecchymosis or bony tenderness. Normal range of motion.  Skin:    General: Skin is warm and dry.  Neurological:     Mental Status: He is alert and oriented to person, place, and time.  Psychiatric:        Mood and Affect: Mood normal.        Behavior: Behavior normal.        Thought Content: Thought content normal.        Judgment: Judgment normal.          Assessment and Plan   1. Essential hypertension   2. Type 2 diabetes mellitus without complication, without long-term current use of insulin ([place])   3. Morbid obesity ([place])   4. Hyperlipidemia, unspecified hyperlipidemia type   5. Fatigue, unspecified type   6. Sleep apnea, unspecified type   7. Chronic pain of both knees   8. Chronic pain of both shoulders   9. Lung nodule      Plan: 1.,  2.,  3.,  4.,  5.,  7.,  8.,  9.  He will  continue on his current medications for his chronic conditions as currently prescribed.  I will collect blood work for  further evaluation of his chronic conditions today.  I am going to start him on Mobic daily as needed, I have told him not to take Mobic and ibuprofen at the same time.  I recommended he try to only take the Mobic on days when he knows he is can be on his feet for prolonged period time.  He tells me he understands.  I will also get x-rays of bilateral shoulders, knees, and lungs today for further evaluation of his pain and concerns with history of probable lung nodules.  I did briefly discuss a plant-based diet and intermittent fasting with the patient today.  I recommended that he try to focus on fasting 16 hours/day for at least 2 days a week to start, with a goal of fasting most days a week.  I told him if he feels unwell at all while fasting he should break his fast by choosing a healthy snack.  We did talk about the importance of hydration during fasting.  I also discussed plant-based eating and recommended he focus on whole foods such as vegetables, berries, whole grains, legumes, and avoid processed carbohydrates and most meat products.  He tells me he will consider this.  6.  He also mentioned to me that he was told he has sleep apnea in the past.  He does not have a CPAP currently, but was told approximately 10 years ago that he would require CPAP when sleeping at night.  He tells me he did have a sleep study back approximately 10 years ago.  He tells me he would like to repeat the sleep study and hopefully get CPAP represcribed.  I will send referral to neurology for further evaluation and management.   Tests ordered Orders Placed This Encounter  Procedures  . DG Knee Complete 4 Views Right  . DG Knee Complete 4 Views Left  . DG Shoulder Right  . DG Shoulder Left  . DG Chest 2 View  . CBC  . CMP with eGFR([facility])  . Lipid Panel  . Hemoglobin A1c  . TSH  . T3, Free  . T4,  Free  . Vitamin D, 25-hydroxy  . Ambulatory referral to Neurology      Meds ordered this encounter  Medications  . meloxicam (MOBIC) 7.5 MG tablet    Sig: Take 1 tablet (7.5 mg total) by mouth daily as needed for pain.    Dispense:  30 tablet    Refill:  3    Order Specific Question:   Supervising Provider    Answer:   Hurshel Party C [[id number]]  . metFORMIN (GLUCOPHAGE) 1000 MG tablet    Sig: Take 1 tablet (1,000 mg total) by mouth 2 (two) times daily.    Dispense:  180 tablet    Refill:  0    Order Specific Question:   Supervising Provider    Answer:   Doree Albee [[id number]]    Patient to follow-up in 1 to 2 weeks to discuss blood work, determine if we need to make changes to his medication regimen, and to further discuss weight loss management.  Ailene Ards, NP

## 2019-06-12 NOTE — Patient Instructions (Addendum)
DO NOT TAKE MOBIC AT THE SAME TIME AS IBUPROFEN. BUT YOU MAY TAKE TYLENOL WITH MOBIC.  [facility] Dietary Recommendations for Weight Loss What to Avoid . Avoid added sugars o Often added sugar can be found in processed foods such as many condiments, dry cereals, cakes, cookies, chips, crisps, crackers, candies, sweetened drinks, etc.  o Read labels and AVOID/DECREASE use of foods with the following in their ingredient list: Sugar, fructose, high fructose corn syrup, sucrose, glucose, maltose, dextrose, molasses, cane sugar, brown sugar, any type of syrup, agave nectar, etc.   . Avoid snacking in between meals . Avoid foods made with flour o If you are going to eat food made with flour, choose those made with whole-grains; and, minimize your consumption as much as is tolerable . Avoid processed foods o These foods are generally stocked in the middle of the grocery store. Focus on shopping on the perimeter of the grocery.  . Avoid Meat  o We recommend following a plant-based diet at [facility]. Thus, we recommend avoiding meat as a general rule. Consider eating beans, legumes, eggs, and/or dairy products for regular protein sources o If you plan on eating meat limit to 4 ounces of meat at a time and choose lean options such as Fish, chicken, [place]. Avoid red meat intake such as pork and/or steak What to Include . Vegetables o GREEN LEAFY VEGETABLES: Kale, spinach, mustard greens, collard greens, cabbage, broccoli, etc. o OTHER: Asparagus, cauliflower, eggplant, carrots, peas, Brussel sprouts, tomatoes, bell peppers, zucchini, beets, cucumbers, etc. . Grains, seeds, and legumes o Beans: kidney beans, black eyed peas, garbanzo beans, black beans, pinto beans, etc. o Whole, unrefined grains: brown rice, barley, bulgur, oatmeal, etc. . Healthy fats  o Avoid highly processed fats such as vegetable oil o Examples of healthy fats: avocado, olives, virgin olive oil, dark  chocolate (?72% Cocoa), nuts (peanuts, almonds, walnuts, cashews, pecans, etc.) . None to Low Intake of Animal Sources of Protein o Meat sources: chicken, [place], salmon, tuna. Limit to 4 ounces of meat at one time. o Consider limiting dairy sources, but when choosing dairy focus on: PLAIN [place] yogurt, cottage cheese, high-protein milk . Fruit o Choose berries  When to Eat . Intermittent Fasting: o Choosing not to eat for a specific time period, but DO FOCUS ON HYDRATION when fasting o Multiple Techniques: - Time Restricted Eating: eat 3 meals in a day, each meal lasting no more than 60 minutes, no snacks between meals - 16-18 hour fast: fast for 16 to 18 hours up to 7 days a week. Often suggested to start with 2-3 nonconsecutive days per week.  . Remember the time you sleep is counted as fasting.  . Examples of eating schedule: Fast from 7:00pm-11:00am. Eat between 11:00am-7:00pm.  - 24-hour fast: fast for 24 hours up to every other day. Often suggested to start with 1 day per week . Remember the time you sleep is counted as fasting . Examples of eating schedule:  o Eating day: eat 2-3 meals on your eating day. If doing 2 meals, each meal should last no more than 90 minutes. If doing 3 meals, each meal should last no more than 60 minutes. Finish last meal by 7:00pm. o Fasting day: Fast until 7:00pm.  o IF YOU FEEL UNWELL FOR ANY REASON/IN ANY WAY WHEN FASTING, STOP FASTING BY EATING A NUTRITIOUS SNACK OR LIGHT MEAL o ALWAYS FOCUS ON HYDRATION DURING FASTS - Acceptable Hydration sources: water, broths, tea/coffee (black  tea/coffee is best but using a small amount of whole-fat dairy products in coffee/tea is acceptable).  - Poor Hydration Sources: anything with sugar or artificial sweeteners added to it  These recommendations have been developed for patients that are actively receiving medical care from either Dr. Anastasio Champion or Jeralyn Ruths, DNP, NP-C at [facility]. These  recommendations are developed for patients with specific medical conditions and are not meant to be distributed or used by others that are not actively receiving care from either provider listed above at [facility]. It is not appropriate to participate in the above eating plans without proper medical supervision.   Reference: Rexanne Mano. The obesity code. [place]; 2016.

## 2019-06-13 LAB — CBC
HCT: 45.8 % (ref 38.5–50.0)
Hemoglobin: 15.2 g/dL (ref 13.2–17.1)
MCH: 30.5 pg (ref 27.0–33.0)
MCHC: 33.2 g/dL (ref 32.0–36.0)
MCV: 91.8 fL (ref 80.0–100.0)
MPV: 11.1 fL (ref 7.5–12.5)
Platelets: 200 10*3/uL (ref 140–400)
RBC: 4.99 10*6/uL (ref 4.20–5.80)
RDW: 12.2 % (ref 11.0–15.0)
WBC: 9.4 10*3/uL (ref 3.8–10.8)

## 2019-06-13 LAB — COMPLETE METABOLIC PANEL WITH GFR
AG Ratio: 1.6 (calc) (ref 1.0–2.5)
ALT: 15 U/L (ref 9–46)
AST: 14 U/L (ref 10–35)
Albumin: 4.7 g/dL (ref 3.6–5.1)
Alkaline phosphatase (APISO): 46 U/L (ref 35–144)
BUN: 24 mg/dL (ref 7–25)
CO2: 24 mmol/L (ref 20–32)
Calcium: 9.3 mg/dL (ref 8.6–10.3)
Chloride: 102 mmol/L (ref 98–110)
Creat: 0.94 mg/dL (ref 0.70–1.33)
GFR, Est African American: 107 mL/min/{1.73_m2} (ref >=60)
GFR, Est Non African American: 92 mL/min/{1.73_m2} (ref >=60)
Globulin: 2.9 g/dL (calc) (ref 1.9–3.7)
Glucose, Bld: 208 mg/dL — ABNORMAL HIGH (ref 65–99)
Potassium: 4.3 mmol/L (ref 3.5–5.3)
Sodium: 136 mmol/L (ref 135–146)
Total Bilirubin: 1.5 mg/dL — ABNORMAL HIGH (ref 0.2–1.2)
Total Protein: 7.6 g/dL (ref 6.1–8.1)

## 2019-06-13 LAB — LIPID PANEL
Cholesterol: 199 mg/dL (ref <200)
HDL: 42 mg/dL (ref >=40)
LDL Cholesterol (Calc): 125 mg/dL (calc) — ABNORMAL HIGH
Non-HDL Cholesterol (Calc): 157 mg/dL (calc) — ABNORMAL HIGH (ref <130)
Total CHOL/HDL Ratio: 4.7 (calc) (ref <5.0)
Triglycerides: 204 mg/dL — ABNORMAL HIGH (ref <150)

## 2019-06-13 LAB — HEMOGLOBIN A1C
Hgb A1c MFr Bld: 7.7 % of total Hgb — ABNORMAL HIGH (ref <5.7)
Mean Plasma Glucose: 174 (calc)
eAG (mmol/L): 9.7 (calc)

## 2019-06-13 LAB — TSH: TSH: 1.26 mIU/L (ref 0.40–4.50)

## 2019-06-13 LAB — T4, FREE: Free T4: 1.2 ng/dL (ref 0.8–1.8)

## 2019-06-13 LAB — T3, FREE: T3, Free: 3.2 pg/mL (ref 2.3–4.2)

## 2019-06-13 LAB — VITAMIN D 25 HYDROXY (VIT D DEFICIENCY, FRACTURES): Vit D, 25-Hydroxy: 15 ng/mL — ABNORMAL LOW (ref 30–100)

## 2019-06-20 ENCOUNTER — Encounter (HOSPITAL_COMMUNITY): Payer: Self-pay | Admitting: Emergency Medicine

## 2019-06-20 ENCOUNTER — Emergency Department (HOSPITAL_COMMUNITY)
Admission: EM | Admit: 2019-06-20 | Discharge: 2019-06-21 | Disposition: A | Discharge status: Home / Self Care | Payer: 59 | Attending: Emergency Medicine | Admitting: Emergency Medicine

## 2019-06-20 ENCOUNTER — Other Ambulatory Visit: Payer: Self-pay

## 2019-06-20 DIAGNOSIS — E119 Type 2 diabetes mellitus without complications: Secondary | ICD-10-CM | POA: Insufficient documentation

## 2019-06-20 DIAGNOSIS — I10 Essential (primary) hypertension: Secondary | ICD-10-CM | POA: Diagnosis not present

## 2019-06-20 DIAGNOSIS — M25572 Pain in left ankle and joints of left foot: Secondary | ICD-10-CM | POA: Diagnosis present

## 2019-06-20 DIAGNOSIS — L03116 Cellulitis of left lower limb: Secondary | ICD-10-CM

## 2019-06-20 DIAGNOSIS — R238 Other skin changes: Secondary | ICD-10-CM | POA: Diagnosis not present

## 2019-06-20 DIAGNOSIS — Z7984 Long term (current) use of oral hypoglycemic drugs: Secondary | ICD-10-CM | POA: Insufficient documentation

## 2019-06-20 DIAGNOSIS — R6883 Chills (without fever): Secondary | ICD-10-CM | POA: Insufficient documentation

## 2019-06-20 LAB — CBC
HCT: 41.9 % (ref 39.0–52.0)
Hemoglobin: 14.1 g/dL (ref 13.0–17.0)
MCH: 30.7 pg (ref 26.0–34.0)
MCHC: 33.7 g/dL (ref 30.0–36.0)
MCV: 91.3 fL (ref 80.0–100.0)
Platelets: 212 10*3/uL (ref 150–400)
RBC: 4.59 MIL/uL (ref 4.22–5.81)
RDW: 12.7 % (ref 11.5–15.5)
WBC: 9 10*3/uL (ref 4.0–10.5)
nRBC: 0 % (ref 0.0–0.2)

## 2019-06-20 NOTE — ED Notes (Signed)
Called x 1 with no answer. 

## 2019-06-20 NOTE — ED Triage Notes (Signed)
Patient states cellulitis in his left foot. Patient states that he has been taking some old clindamycin at home. Left foot is red and swollen. Patient states this started around 5 p.m. this evening.

## 2019-06-21 LAB — BASIC METABOLIC PANEL
Anion gap: 12 (ref 5–15)
BUN: 17 mg/dL (ref 6–20)
CO2: 23 mmol/L (ref 22–32)
Calcium: 9.1 mg/dL (ref 8.9–10.3)
Chloride: 101 mmol/L (ref 98–111)
Creatinine, Ser: 0.9 mg/dL (ref 0.61–1.24)
GFR calc Af Amer: >60 mL/min (ref >60)
GFR calc non Af Amer: >60 mL/min (ref >60)
Glucose, Bld: 179 mg/dL — ABNORMAL HIGH (ref 70–99)
Potassium: 4.3 mmol/L (ref 3.5–5.1)
Sodium: 136 mmol/L (ref 135–145)

## 2019-06-21 MED ORDER — CLINDAMYCIN HCL 150 MG PO CAPS
300.0000 mg | ORAL_CAPSULE | Freq: Once | ORAL | Status: AC
  Administered 2019-06-21: 300 mg via ORAL
  Filled 2019-06-21: qty 2

## 2019-06-21 MED ORDER — CLINDAMYCIN HCL 300 MG PO CAPS
300.0000 mg | ORAL_CAPSULE | Freq: Three times a day (TID) | ORAL | 0 refills | Status: DC
Start: 2019-06-21 — End: 2019-07-11

## 2019-06-21 NOTE — ED Provider Notes (Signed)
[facility] EMERGENCY DEPARTMENT Provider Note   CSN: [id number] Arrival date & time: 06/20/19  1905     History Chief Complaint  Patient presents with  . cellulitis    Nathan Velez is a 54 y.o. male.  The history is provided by the patient.  Foot Pain This is a new problem. The current episode started yesterday. The problem occurs constantly. The problem has been gradually worsening. The symptoms are aggravated by walking. The symptoms are relieved by rest.   Patient with history of obesity, diabetes, hypertension, hyperlipidemia presents with left foot pain and swelling.  Also reports redness.  Reports feels similar to prior episodes of cellulitis.  No fevers.  He does report chills.  He had two clindamycin left at home which he started.  It is responded well to clindamycin in the past with previous history of  Cellulitis    Past Medical History:  Diagnosis Date  . Bursitis   . Cellulitis    L Leg  . Depression   . Diabetes mellitus without complication ([facility])   . Edema   . Gout   . Hypercholesteremia   . Hypertension   . IBS (irritable bowel syndrome)   . Murmur, heart   . Panic attacks   . PTSD (post-traumatic stress disorder)   . Sleep apnea   . Sleep apnea   . Tendinitis     Patient Active Problem List   Diagnosis Date Noted  . Mood disorder ([facility]) 05/25/2015  . Uncontrolled type 2 diabetes mellitus with complication ([facility]) 05/25/2015  . Hyperlipidemia 05/25/2015  . Stasis edema 05/25/2015  . Morbid obesity ([facility]) 05/09/2015  . Cellulitis 12/25/2014  . HTN (hypertension) 12/25/2014  . DM type 2 (diabetes mellitus, type 2) ([facility]) 12/25/2014    Past Surgical History:  Procedure Laterality Date  . ABSCESS DRAINAGE         Family History  Problem Relation Age of Onset  . Osteoporosis Mother   . Heart disease Mother   . Hypertension Mother   . Hyperlipidemia Mother   . Anemia Mother     Social History   Tobacco Use  . Smoking status: Never  Smoker  . Smokeless tobacco: Never Used  Substance Use Topics  . Alcohol use: No  . Drug use: No    Home Medications Prior to Admission medications   Medication Sig Start Date End Date Taking? Authorizing Provider  Acetaminophen (TYLENOL) 325 MG CAPS Take 650 mg by mouth every 6 (six) hours as needed.    [provider]  clindamycin (CLEOCIN) 300 MG capsule Take 1 capsule (300 mg total) by mouth 3 (three) times daily. X 7 days 06/21/19   Zadie Rhine, MD  ibuprofen (ADVIL,MOTRIN) 800 MG tablet Take 1 tablet (800 mg total) by mouth every 8 (eight) hours as needed for mild pain. 05/08/15   Ward, Layla Maw, DO  losartan (COZAAR) 50 MG tablet Take 1 tablet (50 mg total) by mouth daily. 12/14/16   Jacquelin Hawking, PA-C  meloxicam (MOBIC) 7.5 MG tablet Take 1 tablet (7.5 mg total) by mouth daily as needed for pain. 06/12/19   Elenore Paddy, NP  metFORMIN (GLUCOPHAGE) 1000 MG tablet Take 1 tablet (1,000 mg total) by mouth 2 (two) times daily. 06/12/19   Elenore Paddy, NP  Multiple Vitamins-Minerals (EMERGEN-C IMMUNE) PACK Take 1 packet by mouth daily as needed.    [provider]  Omega-3 Fatty Acids (FISH OIL) 1000 MG CAPS Take 1 capsule by mouth 2 (  two) times daily.     [provider]  vitamin E 600 UNIT capsule Take 1,200 Units by mouth daily.    [provider]    Allergies    Lisinopril and Penicillins  Review of Systems   Review of Systems  Constitutional: Positive for chills. Negative for fever.  Gastrointestinal: Negative for vomiting.  Skin: Positive for color change and wound.  All other systems reviewed and are negative.   Physical Exam Updated Vital Signs BP 100/68   Pulse 72   Temp 98.2 F (36.8 C) (Oral)   Resp 18   Ht 1.765 m (5' 9.5")   Wt (!) 167.4 kg   SpO2 98%   BMI 53.71 kg/m   Physical Exam CONSTITUTIONAL: Well developed/well nourished HEAD: Normocephalic/atraumatic EYES: EOMI ENMT: Mucous membranes moist NECK:  supple no meningeal signs CV: S1/S2 noted, no murmurs/rubs/gallops noted LUNGS: Lungs are clear to auscultation bilaterally, no apparent distress ABDOMEN: soft, nontender NEURO: Pt is awake/alert/appropriate, moves all extremitiesx4.  No facial droop.  EXTREMITIES: pulses normal/equal, full ROM, area of erythema and tenderness noted to left foot.  No crepitus.  No abscess.  Distal pulses intact.  No puncture wounds.  See photo below.  Full range of motion of left ankle SKIN: warm, color normal PSYCH: no abnormalities of mood noted, alert and oriented to situation   Patient gave verbal permission to utilize photo for medical documentation only The image was not stored on any personal device ED Results / Procedures / Treatments   Labs (all labs ordered are listed, but only abnormal results are displayed) Labs Reviewed  BASIC METABOLIC PANEL - Abnormal; Notable for the following components:      Result Value   Glucose, Bld 179 (*)    All other components within normal limits  CBC    EKG None  Radiology No results found.  Procedures Procedures   Medications Ordered in ED Medications  clindamycin (CLEOCIN) capsule 300 mg (300 mg Oral Given 06/21/19 0324)    ED Course  I have reviewed the triage vital signs and the nursing notes.  Pertinent labs results that were available during my care of the patient were reviewed by me and considered in my medical decision making (see chart for details).    MDM Rules/Calculators/A&P                      Patient with history of cellulitis presents with recurrent episode of left foot.  He reports he has had good response to clindamycin in the past.  This has been sent to his pharmacy.  Labs are reassuring Patient not septic appearing.  We discussed strict ER return precautions. Final Clinical Impression(s) / ED Diagnoses Final diagnoses:  Cellulitis of left foot    Rx / DC Orders ED Discharge Orders         Ordered    clindamycin  (CLEOCIN) 300 MG capsule  3 times daily     06/21/19 0309           Ripley Fraise, MD 06/21/19 [phone]

## 2019-06-24 ENCOUNTER — Ambulatory Visit (INDEPENDENT_AMBULATORY_CARE_PROVIDER_SITE_OTHER): Payer: 59 | Admitting: Nurse Practitioner

## 2019-06-26 ENCOUNTER — Encounter (INDEPENDENT_AMBULATORY_CARE_PROVIDER_SITE_OTHER): Payer: Self-pay | Admitting: Nurse Practitioner

## 2019-06-26 ENCOUNTER — Ambulatory Visit (INDEPENDENT_AMBULATORY_CARE_PROVIDER_SITE_OTHER): Payer: 59 | Admitting: Nurse Practitioner

## 2019-06-26 ENCOUNTER — Other Ambulatory Visit: Payer: Self-pay

## 2019-06-26 VITALS — BP 120/75 | HR 81 | Temp 97.3°F | Ht 69.7 in | Wt 367.2 lb

## 2019-06-26 DIAGNOSIS — I10 Essential (primary) hypertension: Secondary | ICD-10-CM

## 2019-06-26 DIAGNOSIS — R609 Edema, unspecified: Secondary | ICD-10-CM

## 2019-06-26 DIAGNOSIS — M25512 Pain in left shoulder: Secondary | ICD-10-CM

## 2019-06-26 DIAGNOSIS — M25511 Pain in right shoulder: Secondary | ICD-10-CM

## 2019-06-26 DIAGNOSIS — M25562 Pain in left knee: Secondary | ICD-10-CM

## 2019-06-26 DIAGNOSIS — W57XXXA Bitten or stung by nonvenomous insect and other nonvenomous arthropods, initial encounter: Secondary | ICD-10-CM | POA: Diagnosis not present

## 2019-06-26 DIAGNOSIS — M25561 Pain in right knee: Secondary | ICD-10-CM

## 2019-06-26 DIAGNOSIS — G8929 Other chronic pain: Secondary | ICD-10-CM

## 2019-06-26 DIAGNOSIS — E1165 Type 2 diabetes mellitus with hyperglycemia: Secondary | ICD-10-CM

## 2019-06-26 DIAGNOSIS — E559 Vitamin D deficiency, unspecified: Secondary | ICD-10-CM

## 2019-06-26 DIAGNOSIS — M1990 Unspecified osteoarthritis, unspecified site: Secondary | ICD-10-CM

## 2019-06-26 DIAGNOSIS — L03818 Cellulitis of other sites: Secondary | ICD-10-CM

## 2019-06-26 DIAGNOSIS — E785 Hyperlipidemia, unspecified: Secondary | ICD-10-CM

## 2019-06-26 MED ORDER — DICLOFENAC SODIUM 1 % EX GEL: 2.0000 g | Freq: Four times a day (QID) | CUTANEOUS | 3 refills | Status: DC

## 2019-06-26 MED ORDER — MUPIROCIN CALCIUM 2 % EX CREA: 1.0000 "application " | TOPICAL_CREAM | Freq: Two times a day (BID) | CUTANEOUS | 0 refills | Status: DC

## 2019-06-26 MED ORDER — FUROSEMIDE 40 MG PO TABS: 40.0000 mg | ORAL_TABLET | Freq: Every day | ORAL | 2 refills | Status: DC | PRN

## 2019-06-26 NOTE — Progress Notes (Signed)
Subjective:  Patient ID: Nathan Velez, male    DOB: 07/28/1965  Age: 54 y.o. MRN: [id number]  CC:  Chief Complaint  Patient presents with  . Hypertension  . ER f/u for cellulitis  . Diabetes  . Hyperlipidemia  . Other    vitamin d deficiency, bug bites, obesity  . Osteoarthritis      HPI  This patient comes in today for the above.  Hypertension: He has history of hypertension but is not on any medications currently to control this.  He does take furosemide as needed for leg swelling.  Cellulitis: He was seen in the emergency department approximately week and a half ago for cellulitis to his left foot.  At that time the discharged home with clindamycin.  Tells me the redness and tenderness are improving.  Diabetes: He has a history of type 2 diabetes.  Last A1c was collected in May and it was 7.7.  He continues on Metformin 1000 mg twice a day.  He did start intermittent fasting and a plant focused diet.  He tells me he has been fasting anywhere from 13 to 16 hours a day.  He has noted that his blood sugar seem to be improving at home.  He checks it multiple times during the day.  He tells me he would see his blood sugars on a regular basis in the 300s prior to the fasting and now he sees some more in the 180s, and sometimes lower.  He tells me he has been feeling well while fasting.  Hyperlipidemia: Last lipid panel was collected last month and did show LDL of 125.  He is not on any statin therapy at this time.  He does continue on his omega-3 fish oil supplement, triglycerides do remain in the 200s.  Vitamin D deficiency: On his blood work he was found to be deficient in vitamin D.  Serum level was 15.  He has started taking 10,000 IUs of vitamin D3 daily.  Bug bites: He believes that he does have a bedbugs in his home, he does have new bites on his arms today.  They do itch, they are not painful.  Obesity: He also has morbid obesity.  As stated above he has been trying  to focus on intermittent fasting and eating a diet full of fresh produce.  He has been trying to reduce his intake of meat.  Osteoarthritis: At his last office visit he complained of joint pain to multiple joints.  He did undergo x-rays and degenerative changes were noted on these films.  I believe this most likely represents osteoarthritis.   Past Medical History:  Diagnosis Date  . Bursitis   . Cellulitis    L Leg  . Depression   . Diabetes mellitus without complication (HCC)   . Edema   . Gout   . Hypercholesteremia   . Hypertension   . IBS (irritable bowel syndrome)   . Murmur, heart   . Panic attacks   . PTSD (post-traumatic stress disorder)   . Sleep apnea   . Sleep apnea   . Tendinitis       Family History  Problem Relation Age of Onset  . Osteoporosis Mother   . Heart disease Mother   . Hypertension Mother   . Hyperlipidemia Mother   . Anemia Mother     Social History   Social History Narrative  . Not on file   Social History   Tobacco Use  . Smoking  status: Never Smoker  . Smokeless tobacco: Never Used  Substance Use Topics  . Alcohol use: No     Current Meds  Medication Sig  . Acetaminophen (TYLENOL) 325 MG CAPS Take 650 mg by mouth every 6 (six) hours as needed.  . Cholecalciferol (VITAMIN D3) 1.25 MG (50000 UT) TABS Take 10,000 Int'l Units by mouth daily.  . clindamycin (CLEOCIN) 300 MG capsule Take 1 capsule (300 mg total) by mouth 3 (three) times daily. X 7 days  . ibuprofen (ADVIL,MOTRIN) 800 MG tablet Take 1 tablet (800 mg total) by mouth every 8 (eight) hours as needed for mild pain.  Marland Kitchen losartan (COZAAR) 50 MG tablet Take 1 tablet (50 mg total) by mouth daily.  . meloxicam (MOBIC) 7.5 MG tablet Take 1 tablet (7.5 mg total) by mouth daily as needed for pain.  . metFORMIN (GLUCOPHAGE) 1000 MG tablet Take 1 tablet (1,000 mg total) by mouth 2 (two) times daily.  . Multiple Vitamins-Minerals (EMERGEN-C IMMUNE) PACK Take 1 packet by mouth daily  as needed.  . Omega-3 Fatty Acids (FISH OIL) 1000 MG CAPS Take 1 capsule by mouth 2 (two) times daily.   Marland Kitchen OVER THE COUNTER MEDICATION Take 500 mg by mouth daily. Potassium OTC supplement    ROS:  Review of Systems  Constitutional: Negative.   Respiratory: Negative.   Cardiovascular: Negative.   Musculoskeletal: Positive for joint pain.  Skin: Positive for itching.  Neurological: Negative for dizziness and headaches.     Objective:   Today's Vitals: BP 120/75 (BP Location: Right Arm, Patient Position: Sitting, Cuff Size: Normal)   Pulse 81   Temp (!) 97.3 F (36.3 C) (Temporal)   Ht 5' 9.7" (1.77 m)   Wt (!) 367 lb 3.2 oz (166.6 kg)   SpO2 95%   BMI 53.14 kg/m  Vitals with BMI 06/26/2019 06/21/2019 06/20/2019  Height 5' 9.7" - 5' 9.5"  Weight 367 lbs 3 oz - 369 lbs  BMI 53.17 - 53.73  Systolic 120 100 086  Diastolic 75 68 88  Pulse 81 72 93     Physical Exam Vitals reviewed.  Constitutional:      Appearance: Normal appearance. He is obese.  HENT:     Head: Normocephalic and atraumatic.  Cardiovascular:     Rate and Rhythm: Normal rate and regular rhythm.  Pulmonary:     Effort: Pulmonary effort is normal.     Breath sounds: Normal breath sounds.  Musculoskeletal:     Cervical back: Neck supple.  Skin:    General: Skin is warm and dry.     Findings: Rash present.       Neurological:     Mental Status: He is alert and oriented to person, place, and time.  Psychiatric:        Mood and Affect: Mood normal.        Behavior: Behavior normal.        Thought Content: Thought content normal.        Judgment: Judgment normal.          Assessment and Plan   1. Chronic pain of both knees   2. Chronic pain of both shoulders   3. Swelling   4. Bug bite, initial encounter   5. Essential hypertension   6. Cellulitis of other specified site   7. Type 2 diabetes mellitus with hyperglycemia, without long-term current use of insulin (HCC)   8. Hyperlipidemia,  unspecified hyperlipidemia type   9. Vitamin D deficiency  10. Morbid obesity ([place])   11. Osteoarthritis, unspecified osteoarthritis type, unspecified site      Plan: 1.,  2., 10., 11.  For now he will continue to focus on his fasting and try to follow a low inflammatory diet which is full of fresh produce and avoid many meat products.  He will also continue to take Tylenol, ibuprofen, or use Voltaren gel as needed for pain.  Hopefully he will also be able to lose some weight and this may help with his pain control as well.  We need to consider referral in the future if pain progresses.  3.  We will refill his furosemide that he can take as needed.  4.  Going to prescribe mupirocin cream to apply to the bites to prevent infection.  5.  Blood pressure well controlled today we will not initiate any medication at this time.  6.  This seems to be improving, he was encouraged to let us know if symptoms worsen despite being on his clindamycin.  He tells me he understands.  7.  I did discuss his A1c, and per shared decision making he would like to focus on lifestyle changes before adding a second agent.  He will continue to fast intermittently and focus on making dietary changes.  May need to consider adding SGLT2 at next office visit if A1c remains above goal.  8.  I did discuss that guidelines would recommend he be on statin therapy related to the fact that he has type 2 diabetes and elevated LDL.  Per shared decision making he would first like to try 3 months of lifestyle changes as stated in plan 7.  May need to repeat fasting lipid panel at next office visit to determine if his numbers are improving with lifestyle alone.  9.  He will continue on his vitamin D3 supplement, he will be due for serum check at his next visit.   Tests ordered No orders of the defined types were placed in this encounter.     Meds ordered this encounter  Medications  . diclofenac Sodium (VOLTAREN) 1 % GEL     Sig: Apply 2 g topically 4 (four) times daily.    Dispense:  150 g    Refill:  3    Order Specific Question:   Supervising Provider    Answer:   Anastasio Champion, NIMISH C [[id number]]  . furosemide (LASIX) 40 MG tablet    Sig: Take 1 tablet (40 mg total) by mouth daily as needed for edema.    Dispense:  30 tablet    Refill:  2    Order Specific Question:   Supervising Provider    Answer:   Hurshel Party C [[id number]]  . mupirocin cream (BACTROBAN) 2 %    Sig: Apply 1 application topically 2 (two) times daily.    Dispense:  30 g    Refill:  0    Order Specific Question:   Supervising Provider    Answer:   Doree Albee [[id number]]    Patient to follow-up in 3 months, or sooner as needed.  Ailene Ards, NP

## 2019-07-11 ENCOUNTER — Encounter: Payer: Self-pay | Admitting: Neurology

## 2019-07-11 ENCOUNTER — Ambulatory Visit (INDEPENDENT_AMBULATORY_CARE_PROVIDER_SITE_OTHER): Payer: 59 | Admitting: Neurology

## 2019-07-11 VITALS — BP 138/82 | HR 71 | Ht 69.0 in | Wt 365.5 lb

## 2019-07-11 DIAGNOSIS — K611 Rectal abscess: Secondary | ICD-10-CM

## 2019-07-11 DIAGNOSIS — G4719 Other hypersomnia: Secondary | ICD-10-CM | POA: Diagnosis not present

## 2019-07-11 DIAGNOSIS — R600 Localized edema: Secondary | ICD-10-CM

## 2019-07-11 DIAGNOSIS — G4733 Obstructive sleep apnea (adult) (pediatric): Secondary | ICD-10-CM

## 2019-07-11 DIAGNOSIS — R635 Abnormal weight gain: Secondary | ICD-10-CM | POA: Diagnosis not present

## 2019-07-11 DIAGNOSIS — R351 Nocturia: Secondary | ICD-10-CM

## 2019-07-11 DIAGNOSIS — Z6841 Body Mass Index (BMI) 40.0 and over, adult: Secondary | ICD-10-CM

## 2019-07-11 NOTE — Patient Instructions (Signed)

## 2019-07-11 NOTE — Progress Notes (Signed)
Subjective:    Patient ID: Nathan Velez is a 54 y.o. male.  HPI     Huston Foley, MD, PhD [facility] [address] [address] [place], [place] [address]  Dear Maralyn Sago,   I saw your patient, Nathan Velez, Upon your kind request, in my sleep clinic today for initial consultation of his sleep disorder, in particular, evaluation of his obstructive sleep apnea.  The patient is unaccompanied today.  As you know, Nathan Velez is a 54 year old right-handed gentleman with an underlying medical history of hyperlipidemia, hypertension, irritable bowel syndrome, history of panic attacks, recurrent cellulitis, lower extremity edema, gout, depression, tendinitis, and morbid obesity with a BMI of over 50, who was diagnosed with obstructive sleep apnea some 15 years ago.  Prior sleep study results are not available for my review today.  He was told he would definitely need CPAP but never got around to getting his machine.  He is single, lives with his mother, has no children, has 2 small dogs in the household.  He typically sleeps on the couch because it allows him to sleep with the upper body elevated.  He also has to elevate his legs due to swelling.  Bedtime and rise time vary quite a bit, has poor sleep consolidation, wakes up many times at night to go to the bathroom, on average 9 times.  He denies any morning headaches.  He is not aware of any family history of OSA.  When he was about 54 years old he was supposed to have nasal surgery for septum deviation but did not have it done.  He has gained weight over time.  He reports that he had an issue with bedbugs at home, secondary to a visiting cousin who had bedbugs.  He reports that he had this issue taken care of, at the exterminator and also got rid of some bedding and furniture. He works for [employer].  He works from 10:30 AM to 5 PM.  He is a non-smoker and does not drink alcohol, he drinks caffeine in the form of soda,  about 8 ounce per day.  He is working on weight loss.  Tries to eat better, but often skips lunch due to his work schedule.  His Past Medical History Is Significant For: Past Medical History:  Diagnosis Date   Bursitis    Cellulitis    L Leg   Depression    Diabetes mellitus without complication (HCC)    Edema    Gout    Hypercholesteremia    Hypertension    IBS (irritable bowel syndrome)    Murmur, heart    Panic attacks    PTSD (post-traumatic stress disorder)    Sleep apnea    Sleep apnea    Tendinitis     His Past Surgical History Is Significant For: Past Surgical History:  Procedure Laterality Date   ABSCESS DRAINAGE      His Family History Is Significant For: Family History  Problem Relation Age of Onset   Osteoporosis Mother    Heart disease Mother    Hypertension Mother    Hyperlipidemia Mother    Anemia Mother     His Social History Is Significant For: Social History   Socioeconomic History   Marital status: Divorced    Spouse name: Not on file   Number of children: Not on file   Years of education: Not on file   Highest education level: Not on file  Occupational History  Occupation: delivery Education administrator: [employer]  Tobacco Use   Smoking status: Never Smoker   Smokeless tobacco: Never Used  Substance and Sexual Activity   Alcohol use: No   Drug use: No   Sexual activity: Not Currently  Other Topics Concern   Not on file  Social History Narrative   Not on file   Social Determinants of Health   Financial Resource Strain:    Difficulty of Paying Living Expenses:   Food Insecurity:    Worried About Charity fundraiser in the Last Year:    Arboriculturist in the Last Year:   Transportation Needs:    Film/video editor (Medical):    Lack of Transportation (Non-Medical):   Physical Activity:    Days of Exercise per Week:    Minutes of Exercise per Session:   Stress:    Feeling of Stress  :   Social Connections:    Frequency of Communication with Friends and Family:    Frequency of Social Gatherings with Friends and Family:    Attends Religious Services:    Active Member of Clubs or Organizations:    Attends Music therapist:    Marital Status:     His Allergies Are:  Allergies  Allergen Reactions   Lisinopril Other (See Comments)    Dry cough   Penicillins Other (See Comments)    CHILDHOOD ALLERGY Has patient had a PCN reaction causing immediate rash, facial/tongue/throat swelling, SOB or lightheadedness with hypotension: unknown Has patient had a PCN reaction causing severe rash involving mucus membranes or skin necrosis:  unknown Has patient had a PCN reaction that required hospitalization:  no Has patient had a PCN reaction occurring within the last 10 years: no If all of the above answers are "NO", then may proceed with Ceph  :   His Current Medications Are:  Outpatient Encounter Medications as of 07/11/2019  Medication Sig   Acetaminophen (TYLENOL) 325 MG CAPS Take 650 mg by mouth every 6 (six) hours as needed.   Cholecalciferol (VITAMIN D3) 1.25 MG (50000 UT) TABS Take 10,000 Int'l Units by mouth daily.   diclofenac Sodium (VOLTAREN) 1 % GEL Apply 2 g topically 4 (four) times daily.   furosemide (LASIX) 40 MG tablet Take 1 tablet (40 mg total) by mouth daily as needed for edema.   ibuprofen (ADVIL,MOTRIN) 800 MG tablet Take 1 tablet (800 mg total) by mouth every 8 (eight) hours as needed for mild pain.   losartan (COZAAR) 50 MG tablet Take 1 tablet (50 mg total) by mouth daily.   meloxicam (MOBIC) 7.5 MG tablet Take 1 tablet (7.5 mg total) by mouth daily as needed for pain.   metFORMIN (GLUCOPHAGE) 1000 MG tablet Take 1 tablet (1,000 mg total) by mouth 2 (two) times daily.   Multiple Vitamins-Minerals (EMERGEN-C IMMUNE) PACK Take 1 packet by mouth daily as needed.   mupirocin cream (BACTROBAN) 2 % Apply 1 application topically 2  (two) times daily.   mupirocin ointment (BACTROBAN) 2 % Apply topically 2 (two) times daily.   Omega-3 Fatty Acids (FISH OIL) 1000 MG CAPS Take 1 capsule by mouth 2 (two) times daily.    OVER THE COUNTER MEDICATION Take 500 mg by mouth daily. Potassium OTC supplement   vitamin E 600 UNIT capsule Take 1,200 Units by mouth daily.   [DISCONTINUED] clindamycin (CLEOCIN) 300 MG capsule Take 1 capsule (300 mg total) by mouth 3 (three) times daily. X 7 days  No facility-administered encounter medications on file as of 07/11/2019.  :  Review of Systems:  Out of a complete 14 point review of systems, all are reviewed and negative with the exception of these symptoms as listed below:  Review of Systems  Neurological:       Referred by PCP for OSA. Stated that he had a sleep study around 19 years ago and was told that he had several OSA. Due to insurance issues, he was never able to get setup on a cpap. Only sleeps around 2 hours a night.   Epworth Sleepiness Scale 0= would never doze 1= slight chance of dozing 2= moderate chance of dozing 3= high chance of dozing  Sitting and reading:1 Watching TV: 2 Sitting inactive in a public place (ex. Theater or meeting): 1 As a passenger in a car for an hour without a break: 2 Lying down to rest in the afternoon: 3 Sitting and talking to someone: 0 Sitting quietly after lunch (no alcohol): 2 In a car, while stopped in traffic: 0 Total: 11   Psychiatric/Behavioral: Positive for sleep disturbance.    Objective:  Neurological Exam  Physical Exam Physical Examination:   Vitals:   07/11/19 0752  BP: 138/82  Pulse: 71    General Examination: The patient is a very pleasant 54 y.o. male in no acute distress. He appears well-developed and well-nourished and well groomed.   HEENT: Normocephalic, atraumatic, pupils are equal, round and reactive to light, extraocular tracking is good without limitation to gaze excursion or nystagmus noted.  Hearing is grossly intact. Face is symmetric with normal facial animation. Speech is clear with no dysarthria noted. There is no hypophonia. There is no lip, neck/head, jaw or voice tremor. Neck is supple with full range of passive and active motion. There are no carotid bruits on auscultation. Oropharynx exam reveals: mild mouth dryness, adequate dental hygiene and marked airway crowding, due to Small airway entry, thicker soft palate, tonsils in place, appear to be enlarged, Mallampati class III, tonsils not fully visualized, uvula not fully visualized.  Neck circumference is 20 inches.  He has a minimal overbite.  Tongue protrudes centrally in palate elevates symmetrically, he has a significantly deviated septum to the right.   Chest: Clear to auscultation without wheezing, rhonchi or crackles noted.  Heart: S1+S2+0, regular and normal without murmurs, rubs or gallops noted.   Abdomen: Soft, non-tender and non-distended with normal bowel sounds appreciated on auscultation.  Extremities: There is 1-2+ edema in the distal right lower extremity, 1+ edema in the distal left lower extremity.  He reports right ankle pain.  He also reports some right knee pain.   Skin: Warm and dry with evidence of scratch and bite marks in both upper extremities, appear to be old, no scabs, no active scratch marks.    Musculoskeletal: exam reveals no obvious joint deformities, tenderness or joint swelling or erythema.   Neurologically:  Mental status: The patient is awake, alert and oriented in all 4 spheres. His immediate and remote memory, attention, language skills and fund of knowledge are appropriate. There is no evidence of aphasia, agnosia, apraxia or anomia. Speech is clear with normal prosody and enunciation. Thought process is linear. Mood is normal and affect is normal.  Cranial nerves II - XII are as described above under HEENT exam.  Motor exam: Normal bulk, strength and tone is noted. There is no tremor.  Fine motor skills and coordination: grossly intact.  Cerebellar testing: No dysmetria or intention  tremor. There is no truncal or gait ataxia.  Sensory exam: intact to light touch in the upper and lower extremities.  Gait, station and balance: He stands easily. No veering to one side is noted. No leaning to one side is noted. Posture is age-appropriate and stance is narrow based. Gait shows normal stride length and normal pace. No problems turning are noted. Mild limp on the R initially.  Assessment and Plan:  In summary, Jemiah J Mignone is a very pleasant 54 y.o.-year old male with an underlying medical history of hyperlipidemia, hypertension, irritable bowel syndrome, history of panic attacks, recurrent cellulitis, lower extremity edema, gout, depression, tendinitis, and morbid obesity with a BMI of over 50, whose history and physical exam are concerning for obstructive sleep apnea (OSA). I had a long chat with the patient about my findings and the diagnosis of OSA, its prognosis and treatment options. We talked about medical treatments, surgical interventions and non-pharmacological approaches. I explained in particular the risks and ramifications of untreated moderate to severe OSA, especially with respect to developing cardiovascular disease down the Road, including congestive heart failure, difficult to treat hypertension, cardiac arrhythmias, or stroke. Even type 2 diabetes has, in part, been linked to untreated OSA. Symptoms of untreated OSA include daytime sleepiness, memory problems, mood irritability and mood disorder such as depression and anxiety, lack of energy, as well as recurrent headaches, especially morning headaches. We talked about trying to maintain a healthy lifestyle in general, as well as the importance of weight control. We also talked about the importance of good sleep hygiene. I recommended the following at this time: sleep study.  I explained the sleep test procedure to the  patient and also outlined possible surgical and non-surgical treatment options of OSA. He indicated that he would be willing to try CPAP if the need arises. I explained the importance of being compliant with PAP treatment, not only for insurance purposes but primarily to improve His symptoms, and for the patient's long term health benefit, including to reduce His cardiovascular risks. I answered all his questions today and the patient was in agreement. I plan to see him back after the sleep study is completed and encouraged him to call with any interim questions, concerns, problems or updates.   Thank you very much for allowing me to participate in the care of this nice patient. If I can be of any further assistance to you please do not hesitate to call me at [phone].  Sincerely,   Huston Foley, MD, PhD

## 2019-07-18 ENCOUNTER — Telehealth: Payer: Self-pay

## 2019-07-18 NOTE — Telephone Encounter (Signed)
Unable to LVM for pt to call me back to schedule sleep study. Mailbox is full.  

## 2019-08-06 ENCOUNTER — Telehealth: Payer: Self-pay

## 2019-08-06 NOTE — Telephone Encounter (Signed)
Unable to LVM for pt to call me back to schedule sleep study due to voice mailbox being full   We have attempted to call the patient two times to schedule sleep study.  Patient has been unavailable at the phone numbers we have on file and has not returned our calls.  If patient calls back we will schedule them for their sleep study.

## 2019-08-14 ENCOUNTER — Telehealth: Payer: Self-pay

## 2019-08-14 NOTE — Telephone Encounter (Signed)
We have attempted to call the patient two times to schedule sleep study.  Patient has been unavailable at the phone numbers we have on file and has not returned our calls. If patient calls back we will schedule them for their sleep study.  

## 2019-09-05 ENCOUNTER — Other Ambulatory Visit (INDEPENDENT_AMBULATORY_CARE_PROVIDER_SITE_OTHER): Payer: Self-pay | Admitting: Nurse Practitioner

## 2019-09-05 DIAGNOSIS — E119 Type 2 diabetes mellitus without complications: Secondary | ICD-10-CM

## 2019-09-26 ENCOUNTER — Ambulatory Visit (INDEPENDENT_AMBULATORY_CARE_PROVIDER_SITE_OTHER): Payer: 59 | Admitting: Internal Medicine

## 2019-10-07 ENCOUNTER — Telehealth (INDEPENDENT_AMBULATORY_CARE_PROVIDER_SITE_OTHER): Payer: Self-pay

## 2019-10-07 ENCOUNTER — Other Ambulatory Visit (INDEPENDENT_AMBULATORY_CARE_PROVIDER_SITE_OTHER): Payer: Self-pay | Admitting: Nurse Practitioner

## 2019-10-07 DIAGNOSIS — K047 Periapical abscess without sinus: Secondary | ICD-10-CM

## 2019-10-07 MED ORDER — CLINDAMYCIN HCL 300 MG PO CAPS: 300.0000 mg | ORAL_CAPSULE | Freq: Three times a day (TID) | ORAL | 0 refills | Status: DC

## 2019-10-07 NOTE — Telephone Encounter (Signed)
Medication send. Patient told to f/u as scheduled.

## 2019-10-28 ENCOUNTER — Ambulatory Visit (INDEPENDENT_AMBULATORY_CARE_PROVIDER_SITE_OTHER): Payer: 59 | Admitting: Nurse Practitioner

## 2019-10-31 ENCOUNTER — Ambulatory Visit (INDEPENDENT_AMBULATORY_CARE_PROVIDER_SITE_OTHER): Payer: 59 | Admitting: Nurse Practitioner

## 2019-11-19 ENCOUNTER — Ambulatory Visit (INDEPENDENT_AMBULATORY_CARE_PROVIDER_SITE_OTHER): Payer: 59 | Admitting: Nurse Practitioner

## 2019-11-19 ENCOUNTER — Encounter (INDEPENDENT_AMBULATORY_CARE_PROVIDER_SITE_OTHER): Payer: Self-pay | Admitting: Nurse Practitioner

## 2019-11-19 ENCOUNTER — Other Ambulatory Visit: Payer: Self-pay

## 2019-11-19 VITALS — BP 158/72 | HR 78 | Temp 96.8°F | Ht 69.0 in | Wt 372.6 lb

## 2019-11-19 DIAGNOSIS — N529 Male erectile dysfunction, unspecified: Secondary | ICD-10-CM

## 2019-11-19 DIAGNOSIS — E559 Vitamin D deficiency, unspecified: Secondary | ICD-10-CM | POA: Diagnosis not present

## 2019-11-19 DIAGNOSIS — E119 Type 2 diabetes mellitus without complications: Secondary | ICD-10-CM | POA: Diagnosis not present

## 2019-11-19 DIAGNOSIS — I10 Essential (primary) hypertension: Secondary | ICD-10-CM | POA: Diagnosis not present

## 2019-11-19 DIAGNOSIS — M25562 Pain in left knee: Secondary | ICD-10-CM

## 2019-11-19 DIAGNOSIS — G8929 Other chronic pain: Secondary | ICD-10-CM

## 2019-11-19 DIAGNOSIS — M25561 Pain in right knee: Secondary | ICD-10-CM

## 2019-11-19 MED ORDER — METFORMIN HCL 1000 MG PO TABS: 1000.0000 mg | ORAL_TABLET | Freq: Two times a day (BID) | ORAL | 1 refills | Status: DC

## 2019-11-19 MED ORDER — LOSARTAN POTASSIUM 50 MG PO TABS: 50.0000 mg | ORAL_TABLET | Freq: Every day | ORAL | 1 refills | Status: DC

## 2019-11-19 NOTE — Progress Notes (Signed)
Subjective:  Patient ID: Nathan Velez, male    DOB: 08-Sep-1965  Age: 54 y.o. MRN: [id number]  CC:  Chief Complaint  Patient presents with  . Follow-up    having some discomfort in right knee  . Knee Pain  . Hypertension  . Diabetes  . Other    Vitamin D deficiency, erectile dysfunction      HPI  This patient arrives today for the above.  Right knee pain: He continues to have significant knee pain which does affect his quality of life and mobility. He has tried multiple medications including over-the-counter ibuprofen, Tylenol, Voltaren gel and prescription strength ibuprofen as well as meloxicam. None of the medications seem to control his pain well. He does not want to be evaluated by orthopedic doctor at this time due to financial barriers, but may consider this in the future.  Hypertension: He continues on furosemide as needed for swelling, and is prescribed losartan but tells me that he ran out of the medication about a week ago and needs a refill.  Diabetes: Last A1c was 7.7 this was collected about 5 months ago. He is on losartan. He is also on Metformin 1000 twice daily. He is due to have albuminuria checked. He is due for eye exam. He is not currently on statin therapy.  Vitamin D deficiency: He continues on vitamin D3 supplement. He reports quite a high dose at 20,000 IUs of vitamin D3 daily. Last serum level was 15 and this was collected approximately 5 months ago. At that time I believe he was on 10,000 IUs of vitamin D3.  Erectile dysfunction: He also reports that he has been noticing difficulty obtaining and maintaining an erection. He is wondering if his testosterone levels could be low.  Past Medical History:  Diagnosis Date  . Bursitis   . Cellulitis    L Leg  . Depression   . Diabetes mellitus without complication ([place])   . Edema   . Gout   . Hypercholesteremia   . Hypertension   . IBS (irritable bowel syndrome)   . Murmur, heart   . Panic  attacks   . PTSD (post-traumatic stress disorder)   . Sleep apnea   . Sleep apnea   . Tendinitis       Family History  Problem Relation Age of Onset  . Osteoporosis Mother   . Heart disease Mother   . Hypertension Mother   . Hyperlipidemia Mother   . Anemia Mother     Social History   Social History Narrative  . Not on file   Social History   Tobacco Use  . Smoking status: Never Smoker  . Smokeless tobacco: Never Used  Substance Use Topics  . Alcohol use: No     Current Meds  Medication Sig  . Acetaminophen (TYLENOL) 325 MG CAPS Take 650 mg by mouth every 6 (six) hours as needed.  . Cholecalciferol (VITAMIN D3) 1.25 MG (50000 UT) TABS Take 20,000 Int'l Units by mouth daily.  . diclofenac Sodium (VOLTAREN) 1 % GEL Apply 2 g topically 4 (four) times daily.  . furosemide (LASIX) 40 MG tablet Take 1 tablet (40 mg total) by mouth daily as needed for edema.  Marland Kitchen losartan (COZAAR) 50 MG tablet Take 1 tablet (50 mg total) by mouth daily.  . metFORMIN (GLUCOPHAGE) 1000 MG tablet Take 1 tablet (1,000 mg total) by mouth 2 (two) times daily.  . Multiple Vitamins-Minerals (EMERGEN-C IMMUNE) PACK Take 1 packet by mouth  daily as needed.  . mupirocin cream (BACTROBAN) 2 % Apply 1 application topically 2 (two) times daily.  . mupirocin ointment (BACTROBAN) 2 % Apply topically 2 (two) times daily.  . Omega-3 Fatty Acids (FISH OIL) 1000 MG CAPS Take 2 capsules by mouth 2 (two) times daily.  Marland Kitchen OVER THE COUNTER MEDICATION Take 500 mg by mouth daily. Potassium OTC supplement  . vitamin E 600 UNIT capsule Take 1,000 Units by mouth in the morning and at bedtime.  . [DISCONTINUED] clindamycin (CLEOCIN) 300 MG capsule Take 1 capsule (300 mg total) by mouth 3 (three) times daily.  . [DISCONTINUED] ibuprofen (ADVIL,MOTRIN) 800 MG tablet Take 1 tablet (800 mg total) by mouth every 8 (eight) hours as needed for mild pain.  . [DISCONTINUED] losartan (COZAAR) 50 MG tablet Take 1 tablet (50 mg total)  by mouth daily.  . [DISCONTINUED] meloxicam (MOBIC) 7.5 MG tablet Take 1 tablet (7.5 mg total) by mouth daily as needed for pain.  . [DISCONTINUED] metFORMIN (GLUCOPHAGE) 1000 MG tablet Take 1 tablet by mouth twice daily    ROS:  Review of Systems  Constitutional: Negative.   Respiratory: Negative.   Cardiovascular: Negative.   Genitourinary:       (+) erectile dysfunction  Musculoskeletal: Positive for joint pain.  Neurological: Negative.      Objective:   Today's Vitals: BP (!) 158/72   Pulse 78   Temp (!) 96.8 F (36 C) (Temporal)   Ht _0  (1.753 m)   Wt (!) 372 lb 9.6 oz (169 kg)   SpO2 96%   BMI 55.02 kg/m  Vitals with BMI 11/19/2019 07/11/2019 06/26/2019  Height _1  _2  5' 9.7"  Weight 372 lbs 10 oz 365 lbs 8 oz 367 lbs 3 oz  BMI 55 75.17 00.17  Systolic 494 496 759  Diastolic 72 82 75  Pulse 78 71 81     Physical Exam Vitals reviewed.  Constitutional:      Appearance: Normal appearance.  HENT:     Head: Normocephalic and atraumatic.  Cardiovascular:     Rate and Rhythm: Normal rate and regular rhythm.  Pulmonary:     Effort: Pulmonary effort is normal.     Breath sounds: Normal breath sounds.  Musculoskeletal:     Cervical back: Neck supple.  Skin:    General: Skin is warm and dry.  Neurological:     Mental Status: He is alert and oriented to person, place, and time.  Psychiatric:        Mood and Affect: Mood normal.        Behavior: Behavior normal.        Thought Content: Thought content normal.        Judgment: Judgment normal.          Assessment and Plan   1. Essential hypertension   2. Type 2 diabetes mellitus without complication, without long-term current use of insulin ([place])   3. Erectile dysfunction, unspecified erectile dysfunction type   4. Vitamin D deficiency   5. Chronic pain of both knees      Plan: 1. Blood pressure elevated today, however he is also out of his chronic medications. Will send refill to pharmacy  today of his losartan. 2. He will continue on Metformin and ARB. May need to consider discussion regarding starting statin therapy at future visit. Will refer to eye doctor for diabetic eye exam. We will check F6B and metabolic panel today for further evaluation. Also check urine for  albuminuria. 3. We will check testosterone levels for further evaluation today. 4. We will check serum vitamin D level as well as metabolic panel to monitor calcium levels. 5. We did discuss referral to orthopedic doctor for further assistance with managing his chronic pain. He would like to hold off on referral due to financial barriers at this time but may consider this in the future.   Tests ordered Orders Placed This Encounter  Procedures  . Hemoglobin A1c  . Microalbumin/Creatinine Ratio, Urine  . Testosterone Total,Free,Bio, Males  . Vitamin D, 25-hydroxy  . CMP with eGFR([facility])  . Ambulatory referral to Ophthalmology      Meds ordered this encounter  Medications  . losartan (COZAAR) 50 MG tablet    Sig: Take 1 tablet (50 mg total) by mouth daily.    Dispense:  90 tablet    Refill:  1    Please consider 90 day supplies to promote better adherence    Order Specific Question:   Supervising Provider    Answer:   Hurshel Party C [[id number]]  . metFORMIN (GLUCOPHAGE) 1000 MG tablet    Sig: Take 1 tablet (1,000 mg total) by mouth 2 (two) times daily.    Dispense:  180 tablet    Refill:  1    Order Specific Question:   Supervising Provider    Answer:   Doree Albee [[id number]]    Patient to follow-up in 3 months or sooner as needed for annual physical exam.  Ailene Ards, NP

## 2019-11-20 ENCOUNTER — Telehealth (INDEPENDENT_AMBULATORY_CARE_PROVIDER_SITE_OTHER): Payer: Self-pay | Admitting: Nurse Practitioner

## 2019-11-20 LAB — COMPLETE METABOLIC PANEL WITH GFR
AG Ratio: 1.6 (calc) (ref 1.0–2.5)
ALT: 14 U/L (ref 9–46)
AST: 15 U/L (ref 10–35)
Albumin: 4.1 g/dL (ref 3.6–5.1)
Alkaline phosphatase (APISO): 52 U/L (ref 35–144)
BUN: 18 mg/dL (ref 7–25)
CO2: 27 mmol/L (ref 20–32)
Calcium: 9.3 mg/dL (ref 8.6–10.3)
Chloride: 98 mmol/L (ref 98–110)
Creat: 0.91 mg/dL (ref 0.70–1.33)
GFR, Est African American: 111 mL/min/{1.73_m2} (ref >=60)
GFR, Est Non African American: 96 mL/min/{1.73_m2} (ref >=60)
Globulin: 2.6 g/dL (calc) (ref 1.9–3.7)
Glucose, Bld: 262 mg/dL — ABNORMAL HIGH (ref 65–99)
Potassium: 4.9 mmol/L (ref 3.5–5.3)
Sodium: 135 mmol/L (ref 135–146)
Total Bilirubin: 0.9 mg/dL (ref 0.2–1.2)
Total Protein: 6.7 g/dL (ref 6.1–8.1)

## 2019-11-20 LAB — HEMOGLOBIN A1C
Hgb A1c MFr Bld: 8.5 % of total Hgb — ABNORMAL HIGH (ref <5.7)
Mean Plasma Glucose: 197 (calc)
eAG (mmol/L): 10.9 (calc)

## 2019-11-20 LAB — MICROALBUMIN / CREATININE URINE RATIO
Creatinine, Urine: 114 mg/dL (ref 20–320)
Microalb Creat Ratio: 25 mcg/mg creat (ref <30)
Microalb, Ur: 2.8 mg/dL

## 2019-11-20 LAB — TESTOSTERONE TOTAL,FREE,BIO, MALES
Albumin: 4.1 g/dL (ref 3.6–5.1)
Sex Hormone Binding: 13 nmol/L (ref 10–50)
Testosterone: 134 ng/dL — ABNORMAL LOW (ref 250–827)

## 2019-11-20 LAB — VITAMIN D 25 HYDROXY (VIT D DEFICIENCY, FRACTURES): Vit D, 25-Hydroxy: 34 ng/mL (ref 30–100)

## 2019-11-20 NOTE — Telephone Encounter (Signed)
Please call this patient and get him scheduled for a follow-up appointment with Dr. Karilyn Cota between now and his next appointment with myself.  This is to discuss some abnormal lab work.  If he has any questions regarding lab work the results have been released with comments to his MyChart.  Thank you.

## 2019-11-26 ENCOUNTER — Encounter (INDEPENDENT_AMBULATORY_CARE_PROVIDER_SITE_OTHER): Payer: Self-pay | Admitting: Internal Medicine

## 2019-11-26 ENCOUNTER — Ambulatory Visit (INDEPENDENT_AMBULATORY_CARE_PROVIDER_SITE_OTHER): Payer: 59 | Admitting: Internal Medicine

## 2019-11-26 ENCOUNTER — Other Ambulatory Visit: Payer: Self-pay

## 2019-11-26 VITALS — BP 136/78 | HR 82 | Temp 97.7°F | Resp 18 | Ht 69.0 in | Wt 369.2 lb

## 2019-11-26 DIAGNOSIS — N529 Male erectile dysfunction, unspecified: Secondary | ICD-10-CM | POA: Diagnosis not present

## 2019-11-26 DIAGNOSIS — R7989 Other specified abnormal findings of blood chemistry: Secondary | ICD-10-CM | POA: Diagnosis not present

## 2019-11-26 NOTE — Progress Notes (Signed)
Metrics: Intervention Frequency ACO  Documented Smoking Status Yearly  Screened one or more times in 24 months  Cessation Counseling or  Active cessation medication Past 24 months  Past 24 months   Guideline developer: UpToDate (See UpToDate for funding source) Date Released: 2014       Wellness Office Visit  Subjective:  Patient ID: Nathan Velez, male    DOB: [birth year]  Age: 54 y.o. MRN: [id number]  CC: This man comes in because he was found to have a very low testosterone level. HPI  He has morbid obesity, uncontrolled diabetes, hypertension and erectile dysfunction.  Maralyn Sago had checked a testosterone level and it was very low at 134.  The patient denies any visual symptoms, headache or any untoward symptoms that would point towards a pituitary tumor. Past Medical History:  Diagnosis Date  . Bursitis   . Cellulitis    L Leg  . Depression   . Diabetes mellitus without complication ([facility])   . Edema   . Gout   . Hypercholesteremia   . Hypertension   . IBS (irritable bowel syndrome)   . Murmur, heart   . Panic attacks   . PTSD (post-traumatic stress disorder)   . Sleep apnea   . Sleep apnea   . Tendinitis    Past Surgical History:  Procedure Laterality Date  . ABSCESS DRAINAGE       Family History  Problem Relation Age of Onset  . Osteoporosis Mother   . Heart disease Mother   . Hypertension Mother   . Hyperlipidemia Mother   . 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   Social History   Tobacco Use  . Smoking status: Never Smoker  . Smokeless tobacco: Never Used  Substance Use Topics  . Alcohol use: No    Current Meds  Medication Sig  . Acetaminophen (TYLENOL) 325 MG CAPS Take 650 mg by mouth every 6 (six) hours as needed.  . Cholecalciferol (VITAMIN D3) 1.25 MG (50000 UT) TABS Take 20,000 Int'l Units by mouth daily.  . diclofenac Sodium (VOLTAREN) 1 % GEL Apply 2 g  topically 4 (four) times daily.  . furosemide (LASIX) 40 MG tablet Take 1 tablet (40 mg total) by mouth daily as needed for edema.  Marland Kitchen losartan (COZAAR) 50 MG tablet Take 1 tablet (50 mg total) by mouth daily.  . metFORMIN (GLUCOPHAGE) 1000 MG tablet Take 1 tablet (1,000 mg total) by mouth 2 (two) times daily.  . Multiple Vitamins-Minerals (EMERGEN-C IMMUNE) PACK Take 1 packet by mouth daily as needed.  . mupirocin cream (BACTROBAN) 2 % Apply 1 application topically 2 (two) times daily.  . mupirocin ointment (BACTROBAN) 2 % Apply topically 2 (two) times daily.  . Omega-3 Fatty Acids (FISH OIL) 1000 MG CAPS Take 2 capsules by mouth 2 (two) times daily.  Marland Kitchen OVER THE COUNTER MEDICATION Take 500 mg by mouth daily. Potassium OTC supplement  . vitamin E 600 UNIT capsule Take 1,000 Units by mouth in the morning and at bedtime.      Depression screen [facility] 2/9 06/12/2019  Decreased Interest 0  Down, Depressed, Hopeless 0  PHQ - 2 Score 0     Objective:   Today's Vitals: BP 136/78   Pulse 82   Temp 97.7 F (36.5 C) (Temporal)   Resp 18   Ht 5\' 9"  (1.753 m)   Wt (!) 369 lb 3.2 oz (  167.5 kg)   SpO2 96%   BMI 54.52 kg/m  Vitals with BMI 11/26/2019 11/19/2019 07/11/2019  Height 5\' 9"  5\' 9"  5\' 9"   Weight 369 lbs 3 oz 372 lbs 10 oz 365 lbs 8 oz  BMI 54.5 55 53.95  Systolic 136 158  Diastolic 78 72 82  Pulse 82 78 71     Physical Exam   He is morbidly obese but has lost 3 pounds since the last visit.    Assessment   1. Erectile dysfunction, unspecified erectile dysfunction type   2. Low testosterone in male       Tests ordered Orders Placed This Encounter  Procedures  . Prolactin     Plan: 1. First we need to make sure he does not have a pituitary tumor and I will check a prolactin level now.  If it is very elevated, of course we will need to do an MRI scan to look for the pituitary tumor.  If it is not elevated, I think he would actually benefit from testosterone therapy  as well as other lifestyle measures to improve his morbid obesity and diabetes. 2. I will follow up with him in about 3 weeks time to discuss all of this.   No orders of the defined types were placed in this encounter.   , MD

## 2019-11-27 LAB — PROLACTIN: Prolactin: 3.3 ng/mL (ref 2.0–18.0)

## 2019-11-29 ENCOUNTER — Other Ambulatory Visit: Payer: Self-pay

## 2019-11-29 ENCOUNTER — Other Ambulatory Visit: Payer: Self-pay | Admitting: Occupational Medicine

## 2019-11-29 ENCOUNTER — Ambulatory Visit: Payer: Self-pay

## 2019-11-29 DIAGNOSIS — M25532 Pain in left wrist: Secondary | ICD-10-CM

## 2019-12-18 ENCOUNTER — Other Ambulatory Visit: Payer: Self-pay

## 2019-12-18 ENCOUNTER — Ambulatory Visit (INDEPENDENT_AMBULATORY_CARE_PROVIDER_SITE_OTHER): Payer: 59 | Admitting: Internal Medicine

## 2019-12-18 ENCOUNTER — Encounter (INDEPENDENT_AMBULATORY_CARE_PROVIDER_SITE_OTHER): Payer: Self-pay | Admitting: Internal Medicine

## 2019-12-18 ENCOUNTER — Ambulatory Visit (INDEPENDENT_AMBULATORY_CARE_PROVIDER_SITE_OTHER): Payer: 59 | Admitting: Nurse Practitioner

## 2019-12-18 VITALS — BP 138/82 | HR 72 | Ht 69.0 in | Wt 364.0 lb

## 2019-12-18 DIAGNOSIS — E119 Type 2 diabetes mellitus without complications: Secondary | ICD-10-CM

## 2019-12-18 DIAGNOSIS — I10 Essential (primary) hypertension: Secondary | ICD-10-CM

## 2019-12-18 DIAGNOSIS — R7989 Other specified abnormal findings of blood chemistry: Secondary | ICD-10-CM | POA: Diagnosis not present

## 2019-12-18 DIAGNOSIS — N529 Male erectile dysfunction, unspecified: Secondary | ICD-10-CM | POA: Diagnosis not present

## 2019-12-18 NOTE — Progress Notes (Signed)
Metrics: Intervention Frequency ACO  Documented Smoking Status Yearly  Screened one or more times in 24 months  Cessation Counseling or  Active cessation medication Past 24 months  Past 24 months   Guideline developer: UpToDate (See UpToDate for funding source) Date Released: 2014       Wellness Office Visit  Subjective:  Patient ID: Nathan Velez, male    DOB: May 13, 1965  Age: 54 y.o. MRN: [id number]  CC: This man comes in for follow-up regarding low testosterone levels. HPI  He had a prolactin level done which was in the normal range which excludes the possibility of a pituitary tumor since his testosterone levels was 134.  Past Medical History:  Diagnosis Date  . Bursitis   . Cellulitis    L Leg  . Depression   . Diabetes mellitus without complication ([facility])   . Edema   . Gout   . Hypercholesteremia   . Hypertension   . IBS (irritable bowel syndrome)   . Murmur, heart   . Panic attacks   . PTSD (post-traumatic stress disorder)   . Sleep apnea   . Sleep apnea   . Tendinitis    Past Surgical History:  Procedure Laterality Date  . ABSCESS DRAINAGE       Family History  Problem Relation Age of Onset  . Osteoporosis Mother   . Heart disease Mother   . Hypertension Mother   . Hyperlipidemia Mother   . 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   Social History   Tobacco Use  . Smoking status: Never Smoker  . Smokeless tobacco: Never Used  Substance Use Topics  . Alcohol use: No    Current Meds  Medication Sig  . Acetaminophen (TYLENOL) 325 MG CAPS Take 650 mg by mouth every 6 (six) hours as needed.  . Cholecalciferol (VITAMIN D3) 1.25 MG (50000 UT) TABS Take 20,000 Int'l Units by mouth daily.  . diclofenac Sodium (VOLTAREN) 1 % GEL Apply 2 g topically 4 (four) times daily.  . furosemide (LASIX) 40 MG tablet Take 1 tablet (40 mg total) by mouth daily as needed for edema.    Marland Kitchen losartan (COZAAR) 50 MG tablet Take 1 tablet (50 mg total) by mouth daily.  . metFORMIN (GLUCOPHAGE) 1000 MG tablet Take 1 tablet (1,000 mg total) by mouth 2 (two) times daily.  . Multiple Vitamins-Minerals (EMERGEN-C IMMUNE) PACK Take 1 packet by mouth daily as needed.  . mupirocin cream (BACTROBAN) 2 % Apply 1 application topically 2 (two) times daily.  . mupirocin ointment (BACTROBAN) 2 % Apply topically 2 (two) times daily.  . Omega-3 Fatty Acids (FISH OIL) 1000 MG CAPS Take 2 capsules by mouth 2 (two) times daily.  Marland Kitchen OVER THE COUNTER MEDICATION Take 500 mg by mouth daily. Potassium OTC supplement  . vitamin E 600 UNIT capsule Take 1,000 Units by mouth in the morning and at bedtime.      Depression screen [facility] 2/9 06/12/2019  Decreased Interest 0  Down, Depressed, Hopeless 0  PHQ - 2 Score 0     Objective:   Today's Vitals: BP 138/82   Pulse 72   Ht 5\' 9"  (1.753 m)   Wt (!) 364 lb (165.1 kg)   BMI 53.75 kg/m  Vitals with BMI 12/18/2019 11/26/2019 11/19/2019  Height 5\' 9"  5\' 9"  5\' 9"   Weight 364 lbs 369 lbs 3 oz 372 lbs 10 oz  BMI 53.73 54.5 55  Systolic 138 136 774  Diastolic 82 78 72  Pulse 72 82 78     Physical Exam  He remains morbidly obese although he has lost 5 pounds since last visit.  Blood pressure is elevated.     Assessment   1. Low testosterone in male   2. Type 2 diabetes mellitus without complication, without long-term current use of insulin ([facility])   3. Essential hypertension   4. Erectile dysfunction, unspecified erectile dysfunction type   5. Morbid obesity ([facility])       Tests ordered No orders of the defined types were placed in this encounter.    Plan: 1. I discussed testosterone therapy with him at length.  I discussed FDA warnings, benefits and side effects and mode of administration.  He wants to wait after he gets new insurance before he decides upon this. 2. In the meantime, I did discuss with him COVID-19 vaccination and he is  extremely high risk for severe Covid disease with his morbid obesity so I have urged him to get vaccinated. 3. Follow-up with Sarah scheduled in February.   No orders of the defined types were placed in this encounter.   Wilson Singer, MD

## 2020-01-22 ENCOUNTER — Ambulatory Visit (INDEPENDENT_AMBULATORY_CARE_PROVIDER_SITE_OTHER): Payer: 59 | Admitting: Internal Medicine

## 2020-02-26 ENCOUNTER — Encounter (INDEPENDENT_AMBULATORY_CARE_PROVIDER_SITE_OTHER): Payer: 59 | Admitting: Nurse Practitioner

## 2020-03-02 DIAGNOSIS — S93409A Sprain of unspecified ligament of unspecified ankle, initial encounter: Secondary | ICD-10-CM | POA: Insufficient documentation

## 2020-03-17 ENCOUNTER — Telehealth (INDEPENDENT_AMBULATORY_CARE_PROVIDER_SITE_OTHER): Payer: Self-pay

## 2020-03-17 ENCOUNTER — Other Ambulatory Visit (INDEPENDENT_AMBULATORY_CARE_PROVIDER_SITE_OTHER): Payer: Self-pay | Admitting: Nurse Practitioner

## 2020-03-17 DIAGNOSIS — L03818 Cellulitis of other sites: Secondary | ICD-10-CM

## 2020-03-17 MED ORDER — CLINDAMYCIN HCL 300 MG PO CAPS: 300.0000 mg | ORAL_CAPSULE | Freq: Three times a day (TID) | ORAL | 0 refills | Status: DC

## 2020-03-17 NOTE — Telephone Encounter (Signed)
Called patient and gave him the message and patient verbalized an understanding. 

## 2020-03-17 NOTE — Telephone Encounter (Signed)
Patient called and stated that he is having another cellulitis flare up and he wants to know if you can send something in for him? He stated that he did take Clindamycin 300mg  1 capsule 3 times daily as he had a few left and he is elevating his legs and wanted to know if he needed labs tomorrow when he brings his mother in for her visit or if more antibiotic can be sent in for him? He only has enough for today and is helping him because some of the redness is getting better from the 2 capsules he took today and the heat in his leg is not as bad. No available openings as of now for this week. Please advise.

## 2020-03-17 NOTE — Telephone Encounter (Signed)
I sent a some clindamycin to the [facility] pharmacy. Please let him know that I do not generally prescribe medication without doing a visit, but because he has had recurrent cellulitis before and we have no available openings that I went ahead and sent a prescription in.

## 2020-04-06 ENCOUNTER — Ambulatory Visit (INDEPENDENT_AMBULATORY_CARE_PROVIDER_SITE_OTHER): Payer: 59 | Admitting: Nurse Practitioner

## 2020-04-06 ENCOUNTER — Encounter (INDEPENDENT_AMBULATORY_CARE_PROVIDER_SITE_OTHER): Payer: Self-pay | Admitting: Nurse Practitioner

## 2020-04-06 ENCOUNTER — Other Ambulatory Visit: Payer: Self-pay

## 2020-04-06 VITALS — BP 126/74 | HR 86 | Temp 97.9°F | Ht 67.5 in | Wt 361.4 lb

## 2020-04-06 DIAGNOSIS — R079 Chest pain, unspecified: Secondary | ICD-10-CM | POA: Diagnosis not present

## 2020-04-06 DIAGNOSIS — E559 Vitamin D deficiency, unspecified: Secondary | ICD-10-CM

## 2020-04-06 DIAGNOSIS — I10 Essential (primary) hypertension: Secondary | ICD-10-CM | POA: Diagnosis not present

## 2020-04-06 DIAGNOSIS — E1165 Type 2 diabetes mellitus with hyperglycemia: Secondary | ICD-10-CM | POA: Diagnosis not present

## 2020-04-06 DIAGNOSIS — Z1211 Encounter for screening for malignant neoplasm of colon: Secondary | ICD-10-CM | POA: Diagnosis not present

## 2020-04-06 DIAGNOSIS — E785 Hyperlipidemia, unspecified: Secondary | ICD-10-CM

## 2020-04-06 DIAGNOSIS — R002 Palpitations: Secondary | ICD-10-CM

## 2020-04-06 DIAGNOSIS — Z1159 Encounter for screening for other viral diseases: Secondary | ICD-10-CM

## 2020-04-06 NOTE — Progress Notes (Signed)
Subjective:  Patient ID: Nathan Velez, male    DOB: [birth year]  Age: 55 y.o. MRN: [id number]  CC:  Chief Complaint  Patient presents with  . Annual Exam    Doing okay      HPI  This patient arrives today for the above.  He is due for COVID-19 vaccines, colon cancer screening, hepatitis C screening, as well as routine blood work.  Overall he is feeling well.  He does tell me he has had a couple of episodes of chest pain and palpitations over the last few weeks.  He tells me approximately 7 weeks ago he actually called an ambulance due to the episode and he had EKG completed at that time and they told him everything looked normal.  He denies any patterns or obvious triggers such as physical activity for the pain.  He tells me it is left-sided and eventually will subside on its own.  Past Medical History:  Diagnosis Date  . Bursitis   . Cellulitis    L Leg  . Depression   . Diabetes mellitus without complication ([place])   . Edema   . Gout   . Hypercholesteremia   . Hypertension   . IBS (irritable bowel syndrome)   . Murmur, heart   . Panic attacks   . PTSD (post-traumatic stress disorder)   . Sleep apnea   . Sleep apnea   . Tendinitis       Family History  Problem Relation Age of Onset  . Osteoporosis Mother   . Heart disease Mother   . Hypertension Mother   . Hyperlipidemia Mother   . 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   Social History   Tobacco Use  . Smoking status: Never Smoker  . Smokeless tobacco: Never Used  Substance Use Topics  . Alcohol use: No     Current Meds  Medication Sig  . Acetaminophen (TYLENOL) 325 MG CAPS Take 650 mg by mouth every 6 (six) hours as needed.  . Cholecalciferol (VITAMIN D3) 1.25 MG (50000 UT) TABS Take 20,000 Int'l Units by mouth daily.  . diclofenac Sodium (VOLTAREN) 1 % GEL Apply 2 g topically 4 (four) times daily.  .  furosemide (LASIX) 40 MG tablet Take 1 tablet (40 mg total) by mouth daily as needed for edema.  Marland Kitchen losartan (COZAAR) 50 MG tablet Take 1 tablet (50 mg total) by mouth daily.  . meloxicam (MOBIC) 7.5 MG tablet Take 7.5 mg by mouth daily as needed.  . metFORMIN (GLUCOPHAGE) 1000 MG tablet Take 1 tablet (1,000 mg total) by mouth 2 (two) times daily.  . Multiple Vitamins-Minerals (EMERGEN-C IMMUNE) PACK Take 1 packet by mouth daily as needed.  . mupirocin cream (BACTROBAN) 2 % Apply 1 application topically 2 (two) times daily.  . mupirocin ointment (BACTROBAN) 2 % Apply topically 2 (two) times daily.  . naproxen sodium (ANAPROX) 550 MG tablet naproxen sodium 550 mg tablet  Take 1 tablet every 12 hours by oral route with meals.  . Omega-3 Fatty Acids (FISH OIL) 1000 MG CAPS Take 2 capsules by mouth 2 (two) times daily.  Marland Kitchen OVER THE COUNTER MEDICATION Take 500 mg by mouth daily. Potassium OTC supplement  . vitamin E 600 UNIT capsule Take 1,000 Units by mouth in the morning and at bedtime.    ROS:  Review of Systems  Respiratory: Negative for shortness of  breath.   Cardiovascular: Positive for chest pain, palpitations and leg swelling (intermittent).  Gastrointestinal: Negative for abdominal pain, constipation and diarrhea.  Musculoskeletal: Positive for back pain, falls (while at work), joint pain and neck pain.  Neurological: Negative for dizziness.     Objective:   Today's Vitals: BP 126/74   Pulse 86   Temp 97.9 F (36.6 C) (Temporal)   Ht 5' 7.5" (1.715 m)   Wt (!) 361 lb 6.4 oz (163.9 kg)   SpO2 96%   BMI 55.77 kg/m  Vitals with BMI 04/06/2020 12/18/2019 11/26/2019  Height 5' 7.5" _0  _1   Weight 361 lbs 6 oz 364 lbs 369 lbs 3 oz  BMI 55.74 58.30 94.0  Systolic 768 088 110  Diastolic 74 82 78  Pulse 86 72 82     Physical Exam Vitals reviewed.  Constitutional:      General: He is not in acute distress.    Appearance: Normal appearance. He is obese. He is not  ill-appearing.  HENT:     Head: Normocephalic and atraumatic.     Right Ear: Hearing, ear canal and external ear normal. There is impacted cerumen.     Left Ear: Hearing, ear canal and external ear normal. There is impacted cerumen.  Eyes:     General: No scleral icterus.    Extraocular Movements: Extraocular movements intact.     Conjunctiva/sclera: Conjunctivae normal.     Pupils: Pupils are equal, round, and reactive to light.  Neck:     Vascular: No carotid bruit.  Cardiovascular:     Rate and Rhythm: Normal rate and regular rhythm.     Pulses: Normal pulses.          Dorsalis pedis pulses are 2+ on the right side and 2+ on the left side.     Heart sounds: Normal heart sounds.  Pulmonary:     Effort: Pulmonary effort is normal.     Breath sounds: Normal breath sounds.  Abdominal:     General: Bowel sounds are normal. There is no distension.     Palpations: There is no hepatomegaly, splenomegaly, mass or pulsatile mass.     Tenderness: There is no abdominal tenderness.     Hernia: No hernia is present.  Musculoskeletal:        General: No swelling or tenderness.     Cervical back: Normal range of motion and neck supple. No rigidity.     Right foot: No deformity.     Left foot: No deformity.  Feet:     Right foot:     Protective Sensation: 10 sites tested. 10 sites sensed.     Skin integrity: Skin integrity normal.     Toenail Condition: Right toenails are normal.     Left foot:     Protective Sensation: 10 sites tested. 10 sites sensed.     Skin integrity: Skin integrity normal.     Toenail Condition: Left toenails are normal.  Lymphadenopathy:     Cervical: No cervical adenopathy.  Skin:    General: Skin is warm and dry.  Neurological:     General: No focal deficit present.     Mental Status: He is alert and oriented to person, place, and time.     Cranial Nerves: No cranial nerve deficit.     Sensory: No sensory deficit.     Motor: No weakness.     Gait: Gait  normal.  Psychiatric:        Mood  and Affect: Mood normal.        Behavior: Behavior normal.        Thought Content: Thought content normal.        Judgment: Judgment normal.       EKG: Sinus rhythm with signs of possible old anteroseptal infarct   Assessment and Plan   1. Colon cancer screening   2. Essential hypertension   3. Vitamin D deficiency   4. Morbid obesity ([place])   5. Type 2 diabetes mellitus with hyperglycemia, without long-term current use of insulin (HCC)   6. Hyperlipidemia, unspecified hyperlipidemia type   7. Encounter for hepatitis C screening test for low risk patient   8. Chest pain, unspecified type   9. Palpitations      Plan: 1.  He would prefer not to undergo colonoscopy if possible but is willing to undergo Cologuard.  We did discuss that Cologuard is not as sensitive as colonoscopy, and that if the results are positive or indeterminate he will need to undergo colonoscopy at that time.  He denies any known family history of colon cancer or personal history of inflammatory bowel disease. 2.  Blood pressure is acceptable today he will continue on his current medications we will check blood work for further evaluation. 3.  We will check vitamin D levels today. 4.-7. We will check blood work for further evaluation.  Foot exam completed today. 8.-9.  EKG fairly benign looking however he does have multiple risk factors for coronary artery disease so we will refer him to cardiology for further evaluation.   Tests ordered Orders Placed This Encounter  Procedures  . Cologuard  . CBC with Differential/Platelets  . CMP with eGFR([facility])  . Lipid Panel  . Hemoglobin A1c  . TSH  . Vitamin D, 25-hydroxy  . Hep C Antibody  . Ambulatory referral to Cardiology  . EKG 12-Lead      No orders of the defined types were placed in this encounter.   Patient to follow-up in 3 months or sooner as needed.  In addition to performing his annual exam today also  performed an office visit to address his concerns.  Ailene Ards, NP

## 2020-04-07 LAB — CBC WITH DIFFERENTIAL/PLATELET
Absolute Monocytes: 658 cells/uL (ref 200–950)
Basophils Absolute: 47 cells/uL (ref 0–200)
Basophils Relative: 0.5 %
Eosinophils Absolute: 301 cells/uL (ref 15–500)
Eosinophils Relative: 3.2 %
HCT: 44.6 % (ref 38.5–50.0)
Hemoglobin: 15.3 g/dL (ref 13.2–17.1)
Lymphs Abs: 1786 cells/uL (ref 850–3900)
MCH: 31 pg (ref 27.0–33.0)
MCHC: 34.3 g/dL (ref 32.0–36.0)
MCV: 90.3 fL (ref 80.0–100.0)
MPV: 10.9 fL (ref 7.5–12.5)
Monocytes Relative: 7 %
Neutro Abs: 6608 cells/uL (ref 1500–7800)
Neutrophils Relative %: 70.3 %
Platelets: 221 10*3/uL (ref 140–400)
RBC: 4.94 10*6/uL (ref 4.20–5.80)
RDW: 12 % (ref 11.0–15.0)
Total Lymphocyte: 19 %
WBC: 9.4 10*3/uL (ref 3.8–10.8)

## 2020-04-07 LAB — COMPLETE METABOLIC PANEL WITH GFR
AG Ratio: 1.5 (calc) (ref 1.0–2.5)
ALT: 19 U/L (ref 9–46)
AST: 22 U/L (ref 10–35)
Albumin: 4.5 g/dL (ref 3.6–5.1)
Alkaline phosphatase (APISO): 49 U/L (ref 35–144)
BUN: 17 mg/dL (ref 7–25)
CO2: 23 mmol/L (ref 20–32)
Calcium: 9.6 mg/dL (ref 8.6–10.3)
Chloride: 102 mmol/L (ref 98–110)
Creat: 0.87 mg/dL (ref 0.70–1.33)
GFR, Est African American: 114 mL/min/{1.73_m2} (ref >=60)
GFR, Est Non African American: 99 mL/min/{1.73_m2} (ref >=60)
Globulin: 3.1 g/dL (calc) (ref 1.9–3.7)
Glucose, Bld: 160 mg/dL — ABNORMAL HIGH (ref 65–139)
Potassium: 4.8 mmol/L (ref 3.5–5.3)
Sodium: 139 mmol/L (ref 135–146)
Total Bilirubin: 1.9 mg/dL — ABNORMAL HIGH (ref 0.2–1.2)
Total Protein: 7.6 g/dL (ref 6.1–8.1)

## 2020-04-07 LAB — LIPID PANEL
Cholesterol: 183 mg/dL (ref <200)
HDL: 39 mg/dL — ABNORMAL LOW (ref >=40)
LDL Cholesterol (Calc): 111 mg/dL (calc) — ABNORMAL HIGH
Non-HDL Cholesterol (Calc): 144 mg/dL (calc) — ABNORMAL HIGH (ref <130)
Total CHOL/HDL Ratio: 4.7 (calc) (ref <5.0)
Triglycerides: 210 mg/dL — ABNORMAL HIGH (ref <150)

## 2020-04-07 LAB — TSH: TSH: 2.24 mIU/L (ref 0.40–4.50)

## 2020-04-07 LAB — HEMOGLOBIN A1C
Hgb A1c MFr Bld: 8 % of total Hgb — ABNORMAL HIGH (ref <5.7)
Mean Plasma Glucose: 183 mg/dL
eAG (mmol/L): 10.1 mmol/L

## 2020-04-07 LAB — HEPATITIS C ANTIBODY
Hepatitis C Ab: NONREACTIVE
SIGNAL TO CUT-OFF: 0.01 (ref <1.00)

## 2020-04-07 LAB — VITAMIN D 25 HYDROXY (VIT D DEFICIENCY, FRACTURES): Vit D, 25-Hydroxy: 48 ng/mL (ref 30–100)

## 2020-04-23 ENCOUNTER — Encounter (INDEPENDENT_AMBULATORY_CARE_PROVIDER_SITE_OTHER): Payer: Self-pay | Admitting: Nurse Practitioner

## 2020-04-23 ENCOUNTER — Telehealth (INDEPENDENT_AMBULATORY_CARE_PROVIDER_SITE_OTHER): Payer: 59 | Admitting: Nurse Practitioner

## 2020-04-23 VITALS — BP 128/86 | HR 75 | Temp 97.7°F | Ht 67.75 in | Wt 374.4 lb

## 2020-04-23 DIAGNOSIS — E1165 Type 2 diabetes mellitus with hyperglycemia: Secondary | ICD-10-CM

## 2020-04-23 NOTE — Progress Notes (Signed)
Subjective:  Patient ID: Nathan Velez, male    DOB: 11/06/1965  Age: 55 y.o. MRN: [id number]  CC:  Chief Complaint  Patient presents with  . Follow-up    Discuss labs      HPI  This patient arrives today for the above.  At last office visit his A1C was found to be above goal at 7.7. He is on Metformin 1000 BID.  He is here today to discuss management changes aimed at improving his A1c.  Past Medical History:  Diagnosis Date  . Bursitis   . Cellulitis    L Leg  . Depression   . Diabetes mellitus without complication (HCC)   . Edema   . Gout   . Hypercholesteremia   . Hypertension   . IBS (irritable bowel syndrome)   . Murmur, heart   . Panic attacks   . PTSD (post-traumatic stress disorder)   . Sleep apnea   . Sleep apnea   . Tendinitis       Family History  Problem Relation Age of Onset  . Osteoporosis Mother   . Heart disease Mother   . Hypertension Mother   . Hyperlipidemia Mother   . 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   Social History   Tobacco Use  . Smoking status: Never Smoker  . Smokeless tobacco: Never Used  Substance Use Topics  . Alcohol use: No     Current Meds  Medication Sig  . Acetaminophen (TYLENOL) 325 MG CAPS Take 650 mg by mouth every 6 (six) hours as needed.  . Cholecalciferol (VITAMIN D3) 1.25 MG (50000 UT) TABS Take 20,000 Int'l Units by mouth daily.  . diclofenac Sodium (VOLTAREN) 1 % GEL Apply 2 g topically 4 (four) times daily.  . furosemide (LASIX) 40 MG tablet Take 1 tablet (40 mg total) by mouth daily as needed for edema.  Marland Kitchen losartan (COZAAR) 50 MG tablet Take 1 tablet (50 mg total) by mouth daily.  . meloxicam (MOBIC) 7.5 MG tablet Take 7.5 mg by mouth daily as needed.  . metFORMIN (GLUCOPHAGE) 1000 MG tablet Take 1 tablet (1,000 mg total) by mouth 2 (two) times daily.  . Multiple Vitamins-Minerals (EMERGEN-C IMMUNE) PACK  Take 1 packet by mouth daily as needed.  . mupirocin cream (BACTROBAN) 2 % Apply 1 application topically 2 (two) times daily.  . mupirocin ointment (BACTROBAN) 2 % Apply topically 2 (two) times daily.  . naproxen sodium (ANAPROX) 550 MG tablet naproxen sodium 550 mg tablet  Take 1 tablet every 12 hours by oral route with meals.  . Omega-3 Fatty Acids (FISH OIL) 1000 MG CAPS Take 2 capsules by mouth 2 (two) times daily.  Marland Kitchen OVER THE COUNTER MEDICATION Take 500 mg by mouth daily. Potassium OTC supplement  . vitamin E 600 UNIT capsule Take 1,000 Units by mouth in the morning and at bedtime.    ROS:  Review of Systems  Respiratory: Negative for shortness of breath.   Cardiovascular: Positive for chest pain and palpitations.  Neurological: Negative for dizziness and headaches.     Objective:   Today's Vitals: BP 128/86   Pulse 75   Temp 97.7 F (36.5 C) (Temporal)   Ht 5' 7.75" (1.721 m)   Wt (!) 374 lb 6.4 oz (169.8 kg)   SpO2 99%   BMI 57.35 kg/m  Vitals with BMI 04/23/2020 04/06/2020 12/18/2019  Height 5' 7.75" 5' 7.5" 5\' 9"   Weight 374 lbs 6 oz 361 lbs 6 oz 364 lbs  BMI 57.34 55.74 53.73  Systolic 128 126  Diastolic 86 74 82  Pulse 75 86 72     Physical Exam Vitals reviewed.  Constitutional:      Appearance: Normal appearance.  HENT:     Head: Normocephalic and atraumatic.  Cardiovascular:     Rate and Rhythm: Normal rate and regular rhythm.  Pulmonary:     Effort: Pulmonary effort is normal.     Breath sounds: Normal breath sounds.  Musculoskeletal:     Cervical back: Neck supple.  Skin:    General: Skin is warm and dry.  Neurological:     Mental Status: He is alert and oriented to person, place, and time.  Psychiatric:        Mood and Affect: Mood normal.        Behavior: Behavior normal.        Thought Content: Thought content normal.        Judgment: Judgment normal.          Assessment and Plan   1. Type 2 diabetes mellitus with hyperglycemia,  without long-term current use of insulin (HCC)      Plan: 1.  Per Long discussion regarding my recommendations and his preferences he would prefer to focus on nutritional lifestyle before adding additional medication to his regimen.  He would like to see nutritionist as he is not sure exactly what dietary changes he should make.  Referral will be ordered to nutrition therapy today.  We did discuss that if A1c starts to trend upward we seriously should consider adding additional medication to his regimen, he seems to be agreeable to this if necessary.   Tests ordered Orders Placed This Encounter  Procedures  . Referral to Nutrition and Diabetes Services      No orders of the defined types were placed in this encounter.   Patient to follow-up as scheduled in June or sooner as needed.  July, NP

## 2020-04-27 LAB — COLOGUARD: COLOGUARD: NEGATIVE

## 2020-05-05 ENCOUNTER — Telehealth (INDEPENDENT_AMBULATORY_CARE_PROVIDER_SITE_OTHER): Payer: Self-pay

## 2020-05-05 NOTE — Telephone Encounter (Signed)
Patient called and left a voice message that he received a letter from [facility] asking him to call his doctor for the results of the Cologuard test he did.  I looked in the fax folders and do not see results.  I called patient to let him know that we will call him with the results once received.  Please advise if either of you receive the results so I can call the patient back to let him know his results. Thank you!

## 2020-05-05 NOTE — Telephone Encounter (Signed)
Called patient and gave him the lab results. Patient verbalized an understanding.

## 2020-05-05 NOTE — Telephone Encounter (Signed)
We did get the results and they were negative. He will be due to rescreen in 3 years.

## 2020-05-12 ENCOUNTER — Ambulatory Visit: Payer: 59 | Admitting: Nutrition

## 2020-05-18 ENCOUNTER — Other Ambulatory Visit (INDEPENDENT_AMBULATORY_CARE_PROVIDER_SITE_OTHER): Payer: Self-pay | Admitting: Nurse Practitioner

## 2020-05-18 DIAGNOSIS — I10 Essential (primary) hypertension: Secondary | ICD-10-CM

## 2020-05-18 DIAGNOSIS — E119 Type 2 diabetes mellitus without complications: Secondary | ICD-10-CM

## 2020-05-26 ENCOUNTER — Encounter: Payer: Self-pay | Admitting: Cardiology

## 2020-05-26 ENCOUNTER — Ambulatory Visit: Payer: 59 | Admitting: Cardiology

## 2020-05-26 VITALS — BP 130/80 | HR 80 | Ht 69.0 in | Wt 374.0 lb

## 2020-05-26 DIAGNOSIS — R0789 Other chest pain: Secondary | ICD-10-CM

## 2020-05-26 DIAGNOSIS — R002 Palpitations: Secondary | ICD-10-CM

## 2020-05-26 NOTE — Patient Instructions (Signed)
Your physician recommends that you schedule a follow-up appointment in: AS NEEDED WITH DR BRANCH  Your physician recommends that you continue on your current medications as directed. Please refer to the Current Medication list given to you today.  Thank you for choosing [facility]!!    

## 2020-05-26 NOTE — Progress Notes (Signed)
Clinical Summary Nathan Velez is a 55 y.o.male seen as new consult, referred by NP Wallace Cullens for the following medical problems.   1. Chest pain - left sided, sometimes into back, at times in abdomen - pressure, 4/10 in pain. No other associated symptoms. Can last several hours constant.  - better with gas pills, better with belching.  - no exertional symptoms    2. Palpitations - several year history of palpitations - episode 5 months ago while at work. - heart racing, dizziness. He thinks may have been panic attack - no recurrence.    Past Medical History:  Diagnosis Date  . Bursitis   . Cellulitis    L Leg  . Depression   . Diabetes mellitus without complication (HCC)   . Edema   . Gout   . Hypercholesteremia   . Hypertension   . IBS (irritable bowel syndrome)   . Murmur, heart   . Panic attacks   . PTSD (post-traumatic stress disorder)   . Sleep apnea   . Sleep apnea   . Tendinitis      Allergies  Allergen Reactions  . Lisinopril Other (See Comments)    Dry cough  . Penicillins Other (See Comments)    CHILDHOOD ALLERGY Has patient had a PCN reaction causing immediate rash, facial/tongue/throat swelling, SOB or lightheadedness with hypotension: unknown Has patient had a PCN reaction causing severe rash involving mucus membranes or skin necrosis:  unknown Has patient had a PCN reaction that required hospitalization:  no Has patient had a PCN reaction occurring within the last 10 years: no If all of the above answers are "NO", then may proceed with Ceph     Current Outpatient Medications  Medication Sig Dispense Refill  . Acetaminophen (TYLENOL) 325 MG CAPS Take 650 mg by mouth every 6 (six) hours as needed.    . Cholecalciferol (VITAMIN D3) 1.25 MG (50000 UT) TABS Take 20,000 Int'l Units by mouth daily.    . diclofenac Sodium (VOLTAREN) 1 % GEL Apply 2 g topically 4 (four) times daily. 150 g 3  . furosemide (LASIX) 40 MG tablet Take 1 tablet (40 mg total)  by mouth daily as needed for edema. 30 tablet 2  . losartan (COZAAR) 50 MG tablet Take 1 tablet (50 mg total) by mouth daily. 30 tablet 3  . meloxicam (MOBIC) 7.5 MG tablet Take 7.5 mg by mouth daily as needed.    . metFORMIN (GLUCOPHAGE) 1000 MG tablet Take 1 tablet (1,000 mg total) by mouth 2 (two) times daily. 60 tablet 3  . Multiple Vitamins-Minerals (EMERGEN-C IMMUNE) PACK Take 1 packet by mouth daily as needed.    . mupirocin cream (BACTROBAN) 2 % Apply 1 application topically 2 (two) times daily. 30 g 0  . mupirocin ointment (BACTROBAN) 2 % Apply topically 2 (two) times daily.    . naproxen sodium (ANAPROX) 550 MG tablet naproxen sodium 550 mg tablet  Take 1 tablet every 12 hours by oral route with meals.    . Omega-3 Fatty Acids (FISH OIL) 1000 MG CAPS Take 2 capsules by mouth 2 (two) times daily.    Marland Kitchen OVER THE COUNTER MEDICATION Take 500 mg by mouth daily. Potassium OTC supplement    . vitamin E 600 UNIT capsule Take 1,000 Units by mouth in the morning and at bedtime.     No current facility-administered medications for this visit.     Past Surgical History:  Procedure Laterality Date  . ABSCESS DRAINAGE  Allergies  Allergen Reactions  . Lisinopril Other (See Comments)    Dry cough  . Penicillins Other (See Comments)    CHILDHOOD ALLERGY Has patient had a PCN reaction causing immediate rash, facial/tongue/throat swelling, SOB or lightheadedness with hypotension: unknown Has patient had a PCN reaction causing severe rash involving mucus membranes or skin necrosis:  unknown Has patient had a PCN reaction that required hospitalization:  no Has patient had a PCN reaction occurring within the last 10 years: no If all of the above answers are "NO", then may proceed with Ceph      Family History  Problem Relation Age of Onset  . Osteoporosis Mother   . Heart disease Mother   . Hypertension Mother   . Hyperlipidemia Mother   . Anemia Mother      Social  History Nathan Velez reports that he has never smoked. He has never used smokeless tobacco. Nathan Velez reports no history of alcohol use.   Review of Systems CONSTITUTIONAL: No weight loss, fever, chills, weakness or fatigue.  HEENT: Eyes: No visual loss, blurred vision, double vision or yellow sclerae.No hearing loss, sneezing, congestion, runny nose or sore throat.  SKIN: No rash or itching.  CARDIOVASCULAR: per hpi RESPIRATORY: No shortness of breath, cough or sputum.  GASTROINTESTINAL: No anorexia, nausea, vomiting or diarrhea. No abdominal pain or blood.  GENITOURINARY: No burning on urination, no polyuria NEUROLOGICAL: No headache, dizziness, syncope, paralysis, ataxia, numbness or tingling in the extremities. No change in bowel or bladder control.  MUSCULOSKELETAL: No muscle, back pain, joint pain or stiffness.  LYMPHATICS: No enlarged nodes. No history of splenectomy.  PSYCHIATRIC: No history of depression or anxiety.  ENDOCRINOLOGIC: No reports of sweating, cold or heat intolerance. No polyuria or polydipsia.  Marland Kitchen   Physical Examination Today's Vitals   05/26/20 1458  BP: 130/80  Pulse: 80  SpO2: 98%  Weight: (!) 374 lb (169.6 kg)  Height: 5\' 9"  (1.753 m)   Body mass index is 55.23 kg/m.  Gen: resting comfortably, no acute distress HEENT: no scleral icterus, pupils equal round and reactive, no palptable cervical adenopathy,  CV: RRR, no m/r/g, no jvd Resp: Clear to auscultation bilaterally GI: abdomen is soft, non-tender, non-distended, normal bowel sounds, no hepatosplenomegaly MSK: extremities are warm, no edema.  Skin: warm, no rash Neuro:  no focal deficits Psych: appropriate affect      Assessment and Plan  1. Chest pain - noncardiac symptoms, EKG reviewed from pcp without concerning findings - monitor at this time, would not plan for ischemic testing  2. Palpitations - infrequent, last episode 5 months ago. He attributes these to panic attacks as  brought on by severe stress - monitor at this time, if increasein frequency could consider a cardiac monitor       MD

## 2020-06-02 LAB — HM DIABETES EYE EXAM

## 2020-06-11 ENCOUNTER — Ambulatory Visit: Payer: 59 | Admitting: Nutrition

## 2020-07-02 ENCOUNTER — Ambulatory Visit (INDEPENDENT_AMBULATORY_CARE_PROVIDER_SITE_OTHER): Payer: 59 | Admitting: Nurse Practitioner

## 2020-07-02 ENCOUNTER — Other Ambulatory Visit: Payer: Self-pay

## 2020-07-02 ENCOUNTER — Encounter (INDEPENDENT_AMBULATORY_CARE_PROVIDER_SITE_OTHER): Payer: Self-pay | Admitting: Nurse Practitioner

## 2020-07-02 VITALS — BP 116/72 | HR 82 | Temp 97.5°F | Ht 67.5 in | Wt 365.4 lb

## 2020-07-02 DIAGNOSIS — M109 Gout, unspecified: Secondary | ICD-10-CM | POA: Diagnosis not present

## 2020-07-02 MED ORDER — INDOMETHACIN 50 MG PO CAPS: 50.0000 mg | ORAL_CAPSULE | Freq: Three times a day (TID) | ORAL | 0 refills | Status: DC

## 2020-07-02 MED ORDER — COLCHICINE 0.6 MG PO TABS: ORAL_TABLET | ORAL | 0 refills | Status: DC

## 2020-07-02 NOTE — Progress Notes (Signed)
Subjective:  Patient ID: Nathan Velez, male    DOB: 12/08/1965  Age: 55 y.o. MRN: [id number]  CC:  Chief Complaint  Patient presents with   Gout    Having a gout flare up, started on Monday night      HPI  This patient arrives today for an acute visit for the above.  He tells me that he started experiencing some pain in his left great toe on Sunday.  He does have a history of gout and feels like the pain is a gouty flareup.  He has not taken any over-the-counter medication for this.  He has been try to drink alkaline water and tart cherry water which he read will help reduce gout flares.  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   Social History   Tobacco Use   Smoking status: Never   Smokeless tobacco: Never  Substance Use Topics   Alcohol use: No     Current Meds  Medication Sig   Acetaminophen (TYLENOL) 325 MG CAPS Take 650 mg by mouth every 6 (six) hours as needed.   Cholecalciferol (VITAMIN D3) 1.25 MG (50000 UT) TABS Take 20,000 Int'l Units by mouth daily.   colchicine 0.6 MG tablet Take 2 tablets by mouth once. Take 1 additional tablet by mouth in 1 hour if pain persists.   furosemide (LASIX) 40 MG tablet Take 1 tablet (40 mg total) by mouth daily as needed for edema.   indomethacin (INDOCIN) 50 MG capsule Take 1 capsule (50 mg total) by mouth 3 (three) times daily with  meals.   losartan (COZAAR) 50 MG tablet Take 1 tablet (50 mg total) by mouth daily.   meloxicam (MOBIC) 7.5 MG tablet Take 7.5 mg by mouth daily as needed.   metFORMIN (GLUCOPHAGE) 1000 MG tablet Take 1 tablet (1,000 mg total) by mouth 2 (two) times daily.   Multiple Vitamins-Minerals (EMERGEN-C IMMUNE) PACK Take 1 packet by mouth daily as needed.   naproxen sodium (ANAPROX) 550 MG tablet naproxen sodium 550 mg tablet  Take 1 tablet every 12 hours by oral route with meals.   Omega-3 Fatty Acids (FISH OIL) 1000 MG CAPS Take 2 capsules by mouth 2 (two) times daily.   OVER THE COUNTER MEDICATION Take 500 mg by mouth daily. Potassium OTC supplement   vitamin E 600 UNIT capsule Take 1,000 Units by mouth in the morning and at bedtime.    ROS:  See HPI   Objective:   Today's Vitals: BP 116/72   Pulse 82   Temp (!) 97.5 F (36.4 C) (Temporal)   Ht 5' 7.5" (1.715 m)   Wt (!) 365 lb 6.4 oz (165.7 kg)  SpO2 97%   BMI 56.39 kg/m  Vitals with BMI 07/02/2020 05/26/2020 04/23/2020  Height 5' 7.5" 5\' 9"  5' 7.75"  Weight 365 lbs 6 oz 374 lbs 374 lbs 6 oz  BMI 56.35 55.2 57.34  Systolic 116 130  Diastolic 72 80 86  Pulse 82 80 75     Physical Exam Vitals reviewed.  Constitutional:      General: He is not in acute distress.    Appearance: He is normal weight.  Pulmonary:     Effort: Pulmonary effort is normal.  Feet:     Left foot:     Skin integrity: Erythema and warmth present.  Skin:    General: Skin is warm and dry.  Neurological:     General: No focal deficit present.     Mental Status: He is alert. Mental status is at baseline.  Psychiatric:        Mood and Affect: Mood normal.        Behavior: Behavior normal.         Assessment and Plan   1. Acute gout involving toe of left foot, unspecified cause      Plan: 1.  I do agree that he is experiencing acute gouty flare.  We will treat with colchicine indomethacin.  He will follow-up as scheduled next  week.   Tests ordered No orders of the defined types were placed in this encounter.     Meds ordered this encounter  Medications   colchicine 0.6 MG tablet    Sig: Take 2 tablets by mouth once. Take 1 additional tablet by mouth in 1 hour if pain persists.    Dispense:  3 tablet    Refill:  0    Order Specific Question:   Supervising Provider    Answer:   [id number] [[id number]]   indomethacin (INDOCIN) 50 MG capsule    Sig: Take 1 capsule (50 mg total) by mouth 3 (three) times daily with meals.    Dispense:  15 capsule    Refill:  0    Order Specific Question:   Supervising Provider    Answer:   [place] [[id number]]    Patient to follow-up in 1 week as scheduled or sooner.  Wilson Singer, NP

## 2020-07-08 ENCOUNTER — Ambulatory Visit (INDEPENDENT_AMBULATORY_CARE_PROVIDER_SITE_OTHER): Payer: Self-pay | Admitting: Internal Medicine

## 2020-07-29 ENCOUNTER — Telehealth (INDEPENDENT_AMBULATORY_CARE_PROVIDER_SITE_OTHER): Payer: Self-pay

## 2020-07-29 NOTE — Telephone Encounter (Signed)
He has an appointment coming up in the next couple of weeks and I can discuss with him then.

## 2020-08-10 ENCOUNTER — Ambulatory Visit (INDEPENDENT_AMBULATORY_CARE_PROVIDER_SITE_OTHER): Payer: 59 | Admitting: Internal Medicine

## 2020-08-10 ENCOUNTER — Encounter (INDEPENDENT_AMBULATORY_CARE_PROVIDER_SITE_OTHER): Payer: Self-pay | Admitting: Internal Medicine

## 2020-08-10 ENCOUNTER — Other Ambulatory Visit: Payer: Self-pay

## 2020-08-10 VITALS — BP 124/72 | HR 81 | Temp 96.6°F | Ht 67.5 in | Wt 368.2 lb

## 2020-08-10 DIAGNOSIS — R7989 Other specified abnormal findings of blood chemistry: Secondary | ICD-10-CM

## 2020-08-10 DIAGNOSIS — I10 Essential (primary) hypertension: Secondary | ICD-10-CM | POA: Diagnosis not present

## 2020-08-10 DIAGNOSIS — E1165 Type 2 diabetes mellitus with hyperglycemia: Secondary | ICD-10-CM

## 2020-08-10 NOTE — Progress Notes (Signed)
Metrics: Intervention Frequency ACO  Documented Smoking Status Yearly  Screened one or more times in 24 months  Cessation Counseling or  Active cessation medication Past 24 months  Past 24 months   Guideline developer: UpToDate (See UpToDate for funding source) Date Released: 2014       Wellness Office Visit  Subjective:  Patient ID: Nathan Velez, male    DOB: 09-13-1965  Age: 54 y.o. MRN: [id number]  CC: This man comes in for follow-up of morbid obesity, diabetes, hypertension and history of low testosterone levels. HPI  He wants to try testosterone therapy.  His previous insurance would not pay for it and he has new insurance now so is hopeful that it will get paid for. Unfortunately, he has not lost any significant weight. He continues on metformin for diabetes. He continues on losartan for his hypertension. Past Medical History:  Diagnosis Date   Bursitis    Cellulitis    L Leg   Depression    Diabetes mellitus without complication (HCC)    Edema    Gout    Hypercholesteremia    Hypertension    IBS (irritable bowel syndrome)    Murmur, heart    Panic attacks    PTSD (post-traumatic stress disorder)    Sleep apnea    Sleep apnea    Tendinitis    Past Surgical History:  Procedure Laterality Date   ABSCESS DRAINAGE       Family History  Problem Relation Age of Onset   Osteoporosis Mother    Heart disease Mother    Hypertension Mother    Hyperlipidemia Mother    Anemia Mother     Social History   Social History Narrative   Divorced.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   Social History   Tobacco Use   Smoking status: Never   Smokeless tobacco: Never  Substance Use Topics   Alcohol use: No    Current Meds  Medication Sig   Acetaminophen (TYLENOL) 325 MG CAPS Take 650 mg by mouth every 6 (six) hours as needed.   Cholecalciferol (VITAMIN D3) 1.25 MG (50000 UT) TABS Take 20,000 Int'l Units by mouth daily.   colchicine 0.6 MG tablet  Take 2 tablets by mouth once. Take 1 additional tablet by mouth in 1 hour if pain persists.   furosemide (LASIX) 40 MG tablet Take 1 tablet (40 mg total) by mouth daily as needed for edema.   indomethacin (INDOCIN) 50 MG capsule Take 1 capsule (50 mg total) by mouth 3 (three) times daily with meals.   losartan (COZAAR) 50 MG tablet Take 1 tablet (50 mg total) by mouth daily.   meloxicam (MOBIC) 7.5 MG tablet Take 7.5 mg by mouth daily as needed.   metFORMIN (GLUCOPHAGE) 1000 MG tablet Take 1 tablet (1,000 mg total) by mouth 2 (two) times daily.   Multiple Vitamins-Minerals (EMERGEN-C IMMUNE) PACK Take 1 packet by mouth daily as needed.   naproxen sodium (ANAPROX) 550 MG tablet naproxen sodium 550 mg tablet  Take 1 tablet every 12 hours by oral route with meals.   Omega-3 Fatty Acids (FISH OIL) 1000 MG CAPS Take 2 capsules by mouth 2 (two) times daily.   OVER THE COUNTER MEDICATION Take 500 mg by mouth daily. Potassium OTC supplement   vitamin E 600 UNIT capsule Take 1,000 Units by mouth in the morning and at bedtime.     Flowsheet Row Office Visit from 07/02/2020 in [facility]  PHQ-9 Total Score  0       Objective:   Today's Vitals: BP 124/72   Pulse 81   Temp (!) 96.6 F (35.9 C) (Temporal)   Ht 5' 7.5" (1.715 m)   Wt (!) 368 lb 3.2 oz (167 kg)   SpO2 97%   BMI 56.82 kg/m  Vitals with BMI 08/10/2020 07/02/2020 05/26/2020  Height 5' 7.5" 5' 7.5" 5\' 9"   Weight 368 lbs 3 oz 365 lbs 6 oz 374 lbs  BMI 56.78 56.35 55.2  Systolic 124 116  Diastolic 72 72 80  Pulse 81 82 80     Physical Exam  He remains morbidly obese.  He has not really lost any significant weight whatsoever.  Thankfully, his blood pressure is in a good range.     Assessment   1. Type 2 diabetes mellitus with hyperglycemia, without long-term current use of insulin (HCC)   2. Essential hypertension   3. Low testosterone in male       Tests ordered No orders of the defined types were  placed in this encounter.    Plan 1.  Today we focused on nutrition and I encouraged him to do intermittent fasting for at least 16 hours every single day.  He drinks 2 diet Cokes every day and I have told him he needs to stop doing this and drink only water which he does drink a lot of. 2.  Continue with metformin for his diabetes. 3.  Continue with losartan for his hypertension 4.  I will see him in a couple of months to see how his progress is and we will do all the blood work including testosterone levels again.    No orders of the defined types were placed in this encounter.   778, MD

## 2020-08-20 ENCOUNTER — Encounter (INDEPENDENT_AMBULATORY_CARE_PROVIDER_SITE_OTHER): Payer: Self-pay

## 2020-08-20 ENCOUNTER — Telehealth (INDEPENDENT_AMBULATORY_CARE_PROVIDER_SITE_OTHER): Payer: Self-pay

## 2020-08-20 DIAGNOSIS — I10 Essential (primary) hypertension: Secondary | ICD-10-CM

## 2020-08-20 DIAGNOSIS — E119 Type 2 diabetes mellitus without complications: Secondary | ICD-10-CM

## 2020-08-20 MED ORDER — METFORMIN HCL 1000 MG PO TABS
1000.0000 mg | ORAL_TABLET | Freq: Two times a day (BID) | ORAL | 0 refills | Status: DC
Start: 2020-08-20 — End: 2020-11-25

## 2020-08-20 MED ORDER — LOSARTAN POTASSIUM 50 MG PO TABS: 50.0000 mg | ORAL_TABLET | Freq: Every day | ORAL | 0 refills | Status: DC

## 2020-08-20 NOTE — Telephone Encounter (Signed)
Refill approved and sent to [facility]. 

## 2020-08-20 NOTE — Telephone Encounter (Signed)
Patient called and left a detailed voice message that he needs the following prescriptions refilled and sent to [facility] in [place]:  losartan (COZAAR) 50 MG tablet  Last filled 05/18/2020, # 30 with 3 refills  metFORMIN (GLUCOPHAGE) 1000 MG tablet  Last filled 05/18/2020, # 60 with 3 refills

## 2020-08-27 IMAGING — DX DG SHOULDER 2+V*R*
3 series · 3 of 3 positions shown · non-contrast
Comparison: None available.

CLINICAL DATA: Initial evaluation for chronic shoulder pain.

EXAM:
RIGHT SHOULDER - 2+ VIEW

[shoulder grashey]
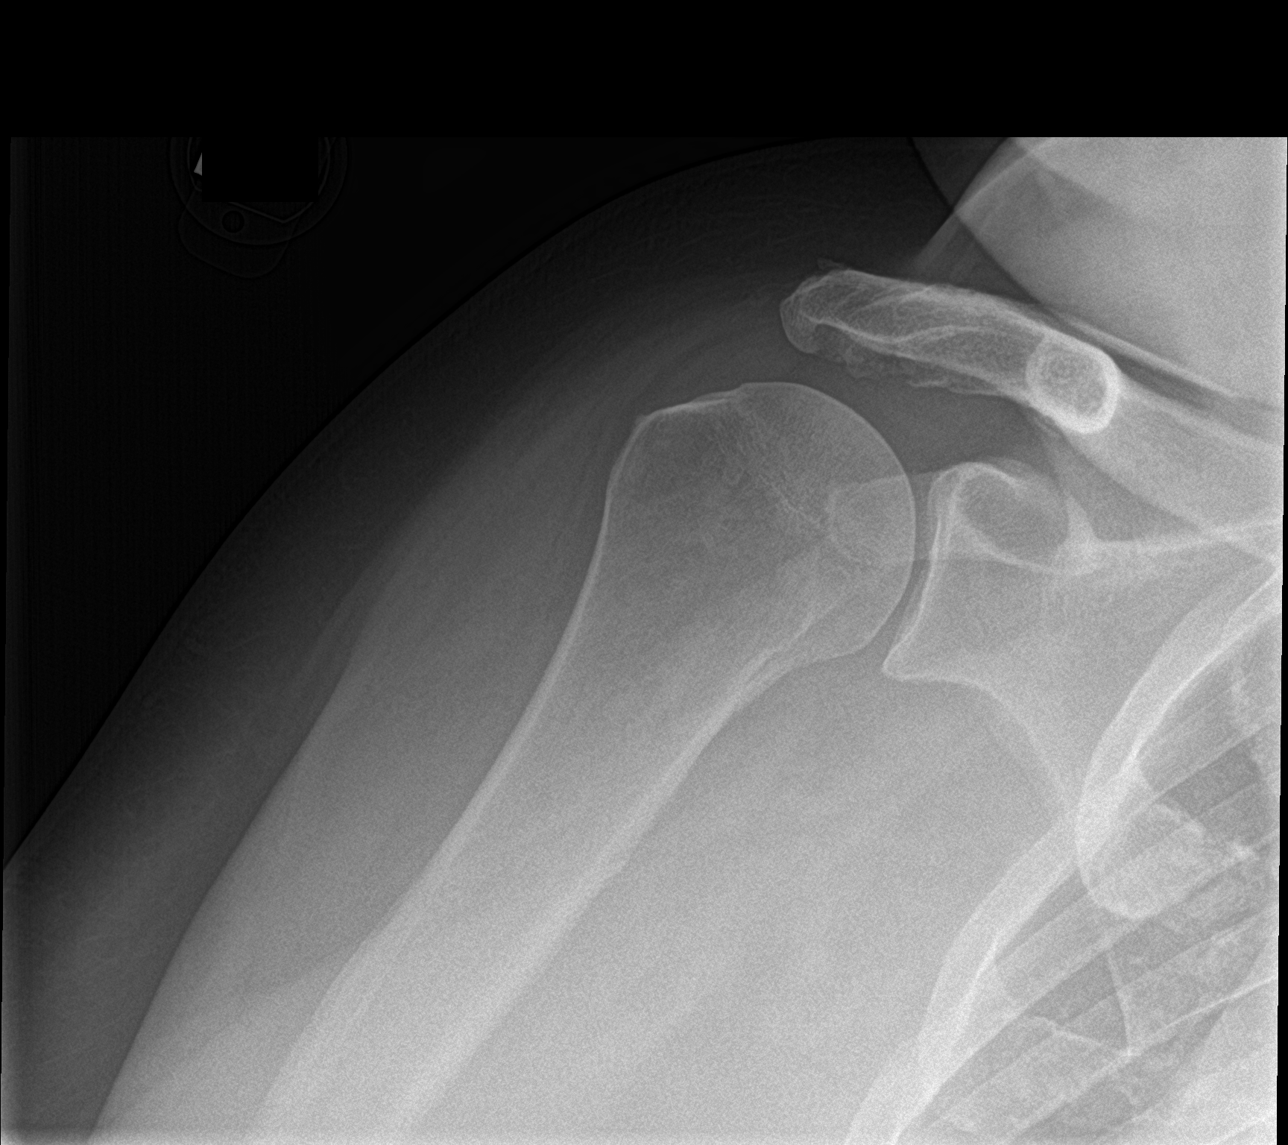

[shoulder y view]
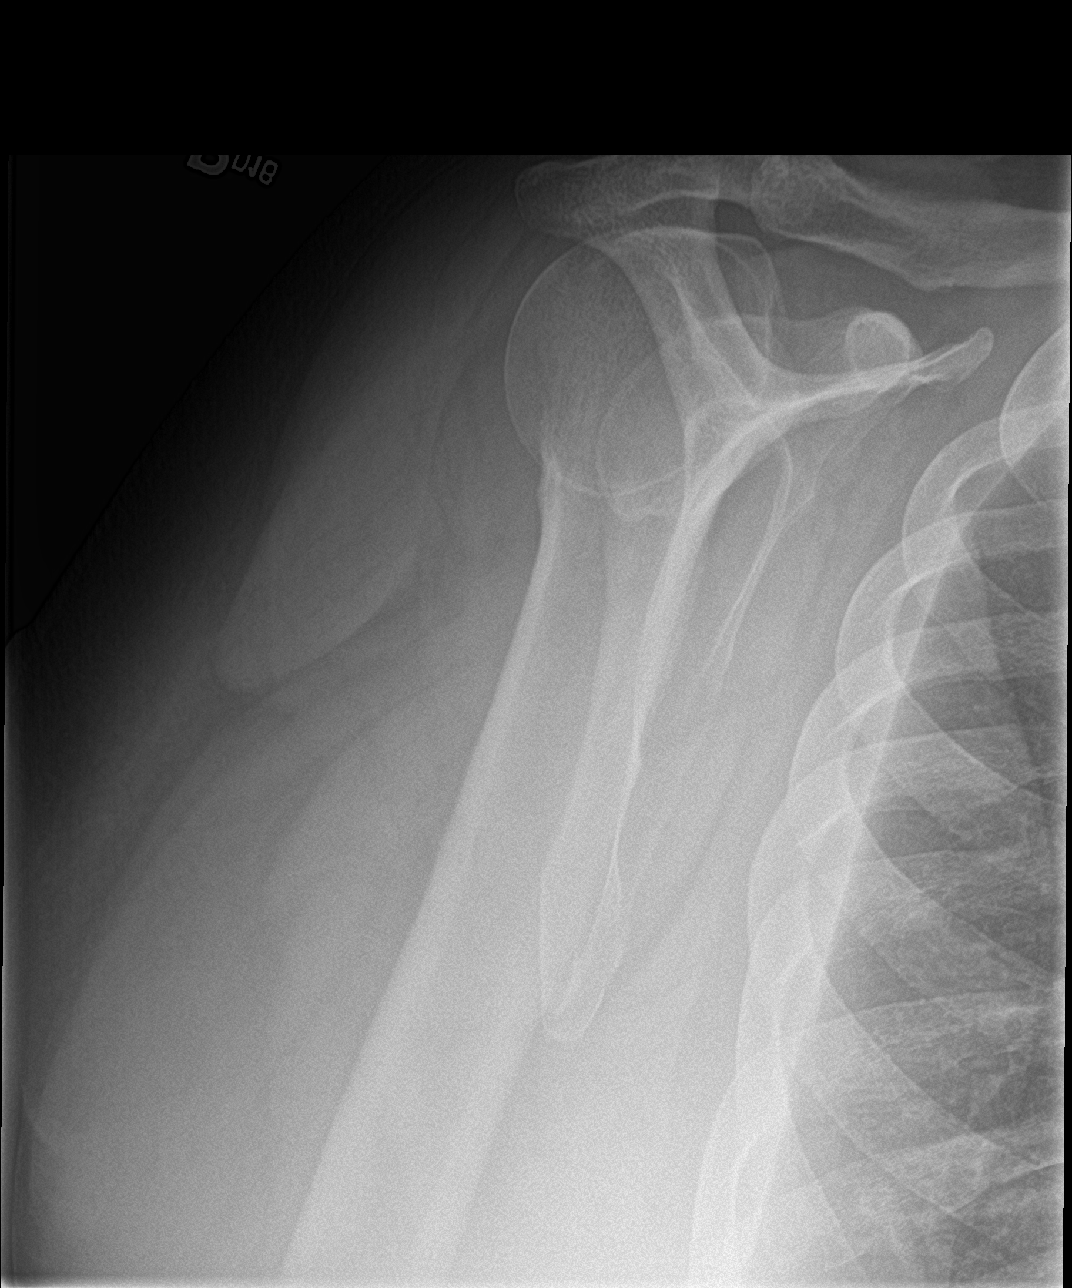

[shoulder axillary]
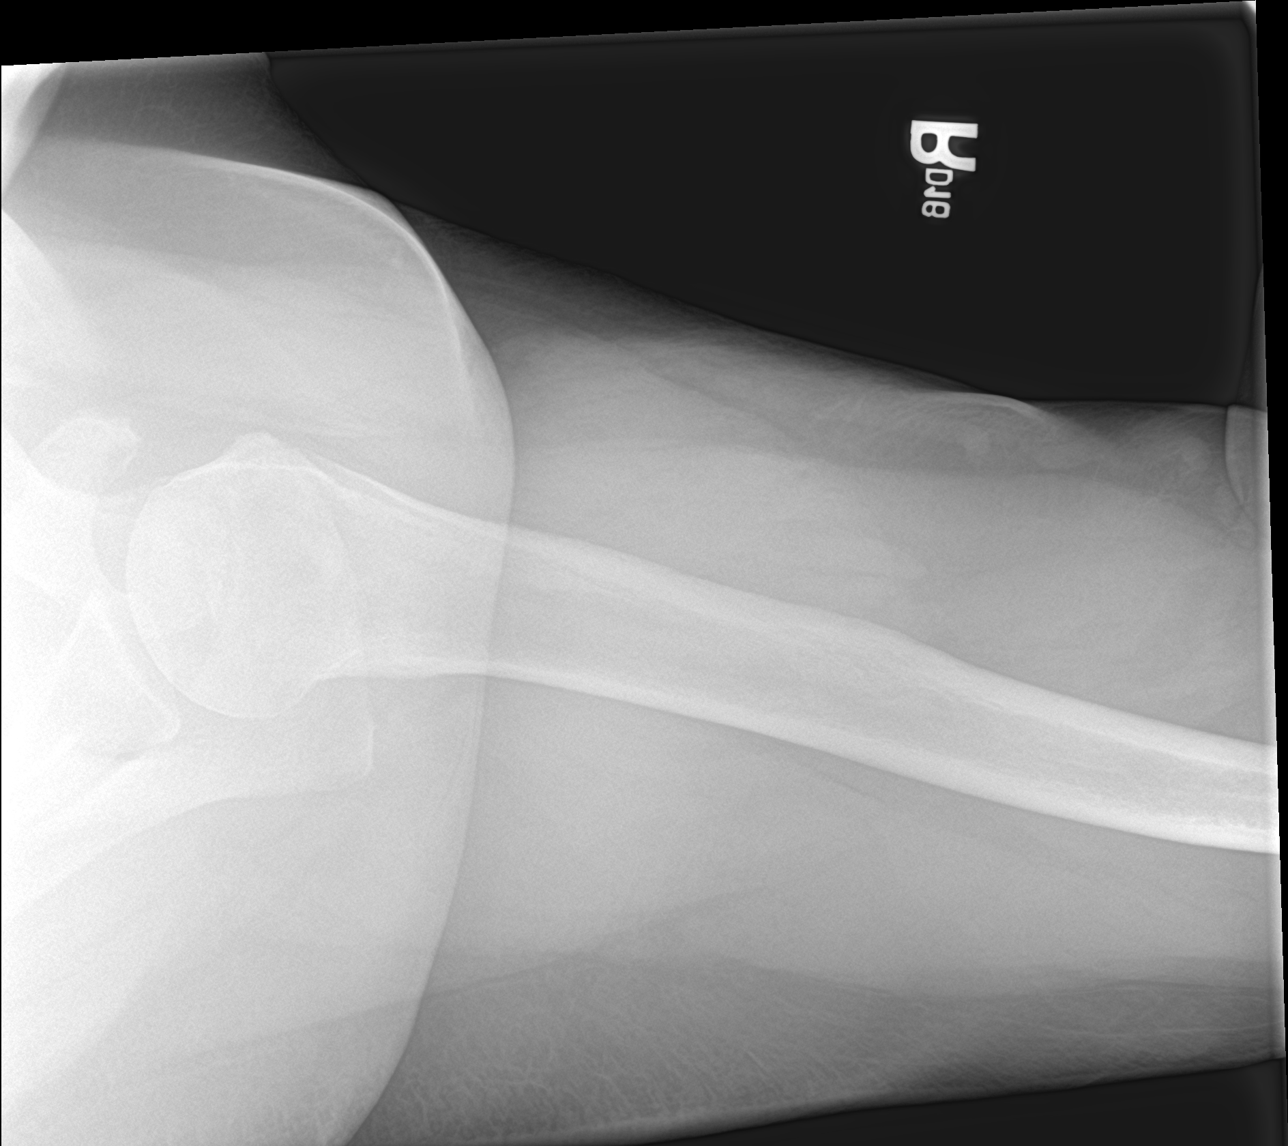

[3 of 3 positions shown; findings below may reference images not displayed]

FINDINGS: No acute fracture or dislocation. Humeral head in normal alignment
with the glenoid. AC joint approximated. No focal osseous lesions.
Minor osteoarthritic changes for age. No periarticular calcification
to suggest underlying calcific tendinopathy. Visualized soft tissues
within normal limits. Visualized right hemithorax clear.
IMPRESSION: 1. Mild degenerative osteoarthritic changes for age.
2. No other acute osseous abnormality identified.

## 2020-09-02 ENCOUNTER — Other Ambulatory Visit (INDEPENDENT_AMBULATORY_CARE_PROVIDER_SITE_OTHER): Payer: Self-pay

## 2020-09-02 DIAGNOSIS — E119 Type 2 diabetes mellitus without complications: Secondary | ICD-10-CM

## 2020-09-10 ENCOUNTER — Telehealth (INDEPENDENT_AMBULATORY_CARE_PROVIDER_SITE_OTHER): Payer: Self-pay

## 2020-09-10 NOTE — Telephone Encounter (Signed)
Patient called and left a detailed voice message and stated that he woke up this morning with elevated heart rate and he took two Aspirin and stated that it was hard to breathe and he is feeling better now and wants to see if he can have something to calm his nerves?

## 2020-09-10 NOTE — Telephone Encounter (Signed)
Called patient and gave him the message. Patient verbalized an understanding. 

## 2020-09-10 NOTE — Telephone Encounter (Signed)
Unfortunately, he will need to go to an urgent care or the emergency department for evaluation as this is the last day our office is open and I am very limited in what diagnostics I can run due to the office closing.

## 2020-10-13 ENCOUNTER — Ambulatory Visit (INDEPENDENT_AMBULATORY_CARE_PROVIDER_SITE_OTHER): Payer: 59 | Admitting: Internal Medicine

## 2020-11-25 ENCOUNTER — Other Ambulatory Visit: Payer: Self-pay

## 2020-11-25 ENCOUNTER — Ambulatory Visit: Payer: Self-pay

## 2020-11-25 ENCOUNTER — Ambulatory Visit
Admission: EM | Admit: 2020-11-25 | Discharge: 2020-11-25 | Disposition: A | Discharge status: Home / Self Care | Payer: 59 | Attending: Family Medicine | Admitting: Family Medicine

## 2020-11-25 DIAGNOSIS — I10 Essential (primary) hypertension: Secondary | ICD-10-CM

## 2020-11-25 DIAGNOSIS — R609 Edema, unspecified: Secondary | ICD-10-CM

## 2020-11-25 DIAGNOSIS — E119 Type 2 diabetes mellitus without complications: Secondary | ICD-10-CM | POA: Diagnosis not present

## 2020-11-25 MED ORDER — LOSARTAN POTASSIUM 50 MG PO TABS: 50.0000 mg | ORAL_TABLET | Freq: Every day | ORAL | 0 refills | Status: DC

## 2020-11-25 MED ORDER — METFORMIN HCL 1000 MG PO TABS: 1000.0000 mg | ORAL_TABLET | Freq: Two times a day (BID) | ORAL | 0 refills | Status: DC

## 2020-11-25 MED ORDER — FUROSEMIDE 40 MG PO TABS: 40.0000 mg | ORAL_TABLET | Freq: Every day | ORAL | 0 refills | Status: DC | PRN

## 2020-11-25 NOTE — ED Provider Notes (Signed)
[facility]    CSN: [id number] Arrival date & time: 11/25/20  1248      History   Chief Complaint No chief complaint on file.   HPI Nathan Velez is a 55 y.o. male.   Presenting today with request for medication refills for his blood pressure and diabetes medications.  He states he is actively working on getting him with a primary care provider but has been delayed multiple times due to providers leaving the area as he is trying to establish.  He currently takes losartan, Lasix for hypertension and edema and metformin for his diabetes.  He does not check his home blood sugars.  Tries eat a healthy diet and stay as active as he is able to.  Denies side effects to his medications, chest pain, shortness of breath, low blood sugar spells.     Past Medical History:  Diagnosis Date   Bursitis    Cellulitis    L Leg   Depression    Diabetes mellitus without complication (HCC)    Edema    Gout    Hypercholesteremia    Hypertension    IBS (irritable bowel syndrome)    Murmur, heart    Panic attacks    PTSD (post-traumatic stress disorder)    Sleep apnea    Sleep apnea    Tendinitis     Patient Active Problem List   Diagnosis Date Noted   Vitamin D deficiency 11/19/2019   Chronic pain of both knees 11/19/2019   Mood disorder (HCC) 05/25/2015   Uncontrolled type 2 diabetes mellitus with complication 05/25/2015   Hyperlipidemia 05/25/2015   Stasis edema 05/25/2015   Morbid obesity (HCC) 05/09/2015   Cellulitis 12/25/2014   Essential hypertension 12/25/2014   DM type 2 (diabetes mellitus, type 2) (HCC) 12/25/2014    Past Surgical History:  Procedure Laterality Date   ABSCESS DRAINAGE         Home Medications    Prior to Admission medications   Medication Sig Start Date End Date Taking? Authorizing Provider  Acetaminophen (TYLENOL) 325 MG CAPS Take 650 mg by mouth every 6 (six) hours as needed.    [provider]  Cholecalciferol  (VITAMIN D3) 1.25 MG (50000 UT) TABS Take 20,000 Int'l Units by mouth daily.    [provider]  colchicine 0.6 MG tablet Take 2 tablets by mouth once. Take 1 additional tablet by mouth in 1 hour if pain persists. 07/02/20   Elenore Paddy, NP  furosemide (LASIX) 40 MG tablet Take 1 tablet (40 mg total) by mouth daily as needed for edema. 11/25/20   Particia Nearing, PA-C  indomethacin (INDOCIN) 50 MG capsule Take 1 capsule (50 mg total) by mouth 3 (three) times daily with meals. 07/02/20   Elenore Paddy, NP  losartan (COZAAR) 50 MG tablet Take 1 tablet (50 mg total) by mouth daily. 11/25/20   Particia Nearing, PA-C  meloxicam (MOBIC) 7.5 MG tablet Take 7.5 mg by mouth daily as needed. 03/10/20   [provider]  metFORMIN (GLUCOPHAGE) 1000 MG tablet Take 1 tablet (1,000 mg total) by mouth 2 (two) times daily. 11/25/20   Particia Nearing, PA-C  Multiple Vitamins-Minerals (EMERGEN-C IMMUNE) PACK Take 1 packet by mouth daily as needed.    [provider]  naproxen sodium (ANAPROX) 550 MG tablet naproxen sodium 550 mg tablet  Take 1 tablet every 12 hours by oral route with meals.    [provider]  Omega-3  Fatty Acids (FISH OIL) 1000 MG CAPS Take 2 capsules by mouth 2 (two) times daily.    [provider]  OVER THE COUNTER MEDICATION Take 500 mg by mouth daily. Potassium OTC supplement    [provider]  vitamin E 600 UNIT capsule Take 1,000 Units by mouth in the morning and at bedtime.    [provider]    Family History Family History  Problem Relation Age of Onset   Osteoporosis Mother    Heart disease Mother    Hypertension Mother    Hyperlipidemia Mother    Anemia Mother     Social History Social History   Tobacco Use   Smoking status: Never   Smokeless tobacco: Never  Vaping Use   Vaping Use: Never used  Substance Use Topics   Alcohol use: No   Drug use: No     Allergies   Lisinopril and  Penicillins   Review of Systems Review of Systems Per HPI  Physical Exam Triage Vital Signs ED Triage Vitals  Enc Vitals Group     BP 11/25/20 1336 129/87     Pulse Rate 11/25/20 1336 78     Resp 11/25/20 1336 18     Temp 11/25/20 1336 98.1 F (36.7 C)     Temp Source 11/25/20 1336 Oral     SpO2 11/25/20 1336 97 %     Weight --      Height --      Head Circumference --      Peak Flow --      Pain Score 11/25/20 1333 0     Pain Loc --      Pain Edu? --      Excl. in GC? --    No data found.  Updated Vital Signs BP 129/87 (BP Location: Right Arm)   Pulse 78   Temp 98.1 F (36.7 C) (Oral)   Resp 18   SpO2 97%   Visual Acuity Right Eye Distance:   Left Eye Distance:   Bilateral Distance:    Right Eye Near:   Left Eye Near:    Bilateral Near:     Physical Exam Vitals and nursing note reviewed.  Constitutional:      Appearance: Normal appearance.  HENT:     Head: Atraumatic.  Eyes:     Extraocular Movements: Extraocular movements intact.     Conjunctiva/sclera: Conjunctivae normal.  Cardiovascular:     Rate and Rhythm: Normal rate and regular rhythm.  Pulmonary:     Effort: Pulmonary effort is normal.     Breath sounds: Normal breath sounds.  Musculoskeletal:        General: Normal range of motion.     Cervical back: Normal range of motion and neck supple.  Skin:    General: Skin is warm and dry.  Neurological:     General: No focal deficit present.     Mental Status: He is oriented to person, place, and time.  Psychiatric:        Mood and Affect: Mood normal.        Thought Content: Thought content normal.        Judgment: Judgment normal.     UC Treatments / Results  Labs (all labs ordered are listed, but only abnormal results are displayed) Labs Reviewed - No data to display  EKG   Radiology No results found.  Procedures Procedures (including critical care time)  Medications Ordered in UC Medications - No data to  display  Initial Impression / Assessment and Plan / UC Course  I have reviewed the triage vital signs and the nursing notes.  Pertinent labs & imaging results that were available during my care of the patient were reviewed by me and considered in my medical decision making (see chart for details).     Per chart review last labs were performed 03/2020 and showed normal renal and liver function, electrolytes, mildly elevated blood sugars and A1c.  We will refill medications to bridge him until he can establish with a primary care provider.  Continue good exercise, diet and close monitoring for side effects or symptoms.  Final Clinical Impressions(s) / UC Diagnoses   Final diagnoses:  Essential hypertension  Type 2 diabetes mellitus without complication, without long-term current use of insulin [facility])   Discharge Instructions   None    ED Prescriptions     Medication Sig Dispense Auth. Provider   losartan (COZAAR) 50 MG tablet Take 1 tablet (50 mg total) by mouth daily. 90 tablet Roosvelt Maser [place], [place]   furosemide (LASIX) 40 MG tablet Take 1 tablet (40 mg total) by mouth daily as needed for edema. 90 tablet Roosvelt Maser [place], [place]   metFORMIN (GLUCOPHAGE) 1000 MG tablet Take 1 tablet (1,000 mg total) by mouth 2 (two) times daily. 180 tablet Particia Nearing, [place]      PDMP not reviewed this encounter.   Particia Nearing, [place] 11/25/20 1413

## 2020-11-25 NOTE — ED Triage Notes (Signed)
Patient would like some refills on his medications today.

## 2022-02-17 ENCOUNTER — Ambulatory Visit (INDEPENDENT_AMBULATORY_CARE_PROVIDER_SITE_OTHER): Payer: 59 | Admitting: Nurse Practitioner

## 2022-02-17 VITALS — BP 112/70 | HR 95 | Temp 98.1°F | Ht 67.5 in | Wt 340.0 lb

## 2022-02-17 DIAGNOSIS — E559 Vitamin D deficiency, unspecified: Secondary | ICD-10-CM | POA: Diagnosis not present

## 2022-02-17 DIAGNOSIS — I10 Essential (primary) hypertension: Secondary | ICD-10-CM

## 2022-02-17 DIAGNOSIS — E785 Hyperlipidemia, unspecified: Secondary | ICD-10-CM | POA: Diagnosis not present

## 2022-02-17 DIAGNOSIS — E1165 Type 2 diabetes mellitus with hyperglycemia: Secondary | ICD-10-CM

## 2022-02-17 DIAGNOSIS — M25561 Pain in right knee: Secondary | ICD-10-CM

## 2022-02-17 DIAGNOSIS — G8929 Other chronic pain: Secondary | ICD-10-CM

## 2022-02-17 NOTE — Assessment & Plan Note (Signed)
Chronic, check lipid panel today, further recommendations may be made based upon the results.

## 2022-02-17 NOTE — Assessment & Plan Note (Signed)
Chronic, check A1c today.  For now continue on Farxiga 10 mg daily and metformin 1000 mg twice a day.

## 2022-02-17 NOTE — Assessment & Plan Note (Signed)
Referred to pain management for assistance regarding his chronic knee pain.

## 2022-02-17 NOTE — Progress Notes (Signed)
Established Patient Office Visit  Subjective   Patient ID: Nathan Velez, male    DOB: 1965-08-15  Age: 57 y.o. MRN: [id number]  Chief Complaint  Patient presents with   Diabetes    Patient arrives to reestablish care. Lost to follow-up once my last office closed. Has been seeing providers outside of the [facility] system over the last year or so. Requesting refills on his chronic medications. Does not have labs within the last year that I can review today. Overall feeling well. Experiencing pain in his right knee that is chronic. Reported having a fall at work last year where he hit his head and had worsening knee pain. Completed physical therapy related to this.  Has history of chronic Percocet use, has not been taking Percocet for his chronic pain over the last year or more.  Was prescribed Suboxone by another provider but he did not tolerate medication well.  Would like to consider restarting Percocet for management of his chronic knee pain.    Review of Systems  Respiratory:  Negative for shortness of breath.   Cardiovascular:  Negative for chest pain.  Musculoskeletal:  Positive for joint pain.      Objective:     BP 112/70   Pulse 95   Temp 98.1 F (36.7 C) (Temporal)   Ht 5' 7.5" (1.715 m)   Wt (!) 340 lb (154.2 kg)   SpO2 100%   BMI 52.47 kg/m  BP Readings from Last 3 Encounters:  02/17/22 112/70  11/25/20 129/87  08/10/20 124/72   Wt Readings from Last 3 Encounters:  02/17/22 (!) 340 lb (154.2 kg)  08/10/20 (!) 368 lb 3.2 oz (167 kg)  07/02/20 (!) 365 lb 6.4 oz (165.7 kg)      Physical Exam Vitals reviewed.  Constitutional:      Appearance: Normal appearance.  HENT:     Head: Normocephalic and atraumatic.  Cardiovascular:     Rate and Rhythm: Normal rate and regular rhythm.  Pulmonary:     Effort: Pulmonary effort is normal.     Breath sounds: Normal breath sounds.  Musculoskeletal:     Cervical back: Neck supple.  Skin:    General: Skin is  warm and dry.  Neurological:     Mental Status: He is alert and oriented to person, place, and time.  Psychiatric:        Mood and Affect: Mood normal.        Behavior: Behavior normal.        Thought Content: Thought content normal.        Judgment: Judgment normal.      No results found for any visits on 02/17/22.    The 10-year ASCVD risk score (Arnett DK, et al., 2019) is: 10.9%    Assessment & Plan:   Problem List Items Addressed This Visit       Cardiovascular and Mediastinum   Essential hypertension - Primary    Chronic, continue losartan 50 mg daily.  Blood pressure well-controlled on current regimen.  Check metabolic panel today, further recommendations may be made based upon these results.      Relevant Medications   tadalafil (CIALIS) 5 MG tablet   Other Relevant Orders   TSH   Hemoglobin A1c   Lipid panel   Comprehensive metabolic panel   CBC   VITAMIN D 25 Hydroxy (Vit-D Deficiency, Fractures)     Endocrine   DM type 2 (diabetes mellitus, type 2) ([place])  Chronic, check A1c today.  For now continue on Farxiga 10 mg daily and metformin 1000 mg twice a day.      Relevant Medications   dapagliflozin propanediol (FARXIGA) 10 MG TABS tablet   Other Relevant Orders   TSH   Hemoglobin A1c   Lipid panel   Comprehensive metabolic panel   CBC   VITAMIN D 25 Hydroxy (Vit-D Deficiency, Fractures)     Other   Hyperlipidemia    Chronic, check lipid panel today, further recommendations may be made based upon the results.      Relevant Medications   tadalafil (CIALIS) 5 MG tablet   Other Relevant Orders   TSH   Hemoglobin A1c   Lipid panel   Comprehensive metabolic panel   CBC   VITAMIN D 25 Hydroxy (Vit-D Deficiency, Fractures)   Vitamin D deficiency    Chronic, check serum vitamin D level.  Further recommendations may be made based upon his results.      Relevant Orders   TSH   Hemoglobin A1c   Lipid panel   Comprehensive metabolic panel    CBC   VITAMIN D 25 Hydroxy (Vit-D Deficiency, Fractures)   Chronic pain of right knee    Referred to pain management for assistance regarding his chronic knee pain.      Relevant Orders   Ambulatory referral to Pain Clinic    Return in about 3 months (around 05/18/2022) for f/u with Elina Streng CPE.    Ailene Ards, NP

## 2022-02-17 NOTE — Assessment & Plan Note (Signed)
Chronic, continue losartan 50 mg daily.  Blood pressure well-controlled on current regimen.  Check metabolic panel today, further recommendations may be made based upon these results.

## 2022-02-17 NOTE — Assessment & Plan Note (Signed)
Chronic, check serum vitamin D level.  Further recommendations may be made based upon his results.

## 2022-02-18 ENCOUNTER — Other Ambulatory Visit: Payer: Self-pay | Admitting: Nurse Practitioner

## 2022-02-18 DIAGNOSIS — I10 Essential (primary) hypertension: Secondary | ICD-10-CM

## 2022-02-18 DIAGNOSIS — E119 Type 2 diabetes mellitus without complications: Secondary | ICD-10-CM

## 2022-02-18 DIAGNOSIS — M109 Gout, unspecified: Secondary | ICD-10-CM

## 2022-02-18 DIAGNOSIS — G8929 Other chronic pain: Secondary | ICD-10-CM

## 2022-02-18 DIAGNOSIS — R609 Edema, unspecified: Secondary | ICD-10-CM

## 2022-02-18 LAB — LIPID PANEL
Cholesterol: 219 mg/dL — ABNORMAL HIGH (ref 0–200)
HDL: 40 mg/dL (ref >39.00)
LDL Cholesterol: 140 mg/dL — ABNORMAL HIGH (ref 0–99)
NonHDL: 179
Total CHOL/HDL Ratio: 5
Triglycerides: 197 mg/dL — ABNORMAL HIGH (ref 0.0–149.0)
VLDL: 39.4 mg/dL (ref 0.0–40.0)

## 2022-02-18 LAB — COMPREHENSIVE METABOLIC PANEL
ALT: 14 U/L (ref 0–53)
AST: 15 U/L (ref 0–37)
Albumin: 4.6 g/dL (ref 3.5–5.2)
Alkaline Phosphatase: 60 U/L (ref 39–117)
BUN: 19 mg/dL (ref 6–23)
CO2: 26 mEq/L (ref 19–32)
Calcium: 9.8 mg/dL (ref 8.4–10.5)
Chloride: 100 mEq/L (ref 96–112)
Creatinine, Ser: 0.89 mg/dL (ref 0.40–1.50)
GFR: 96.26 mL/min (ref >60.00)
Glucose, Bld: 156 mg/dL — ABNORMAL HIGH (ref 70–99)
Potassium: 4.9 mEq/L (ref 3.5–5.1)
Sodium: 137 mEq/L (ref 135–145)
Total Bilirubin: 1.2 mg/dL (ref 0.2–1.2)
Total Protein: 7.9 g/dL (ref 6.0–8.3)

## 2022-02-18 LAB — HEMOGLOBIN A1C: Hgb A1c MFr Bld: 7.3 % — ABNORMAL HIGH (ref 4.6–6.5)

## 2022-02-18 LAB — CBC
HCT: 47.3 % (ref 39.0–52.0)
Hemoglobin: 16.7 g/dL (ref 13.0–17.0)
MCHC: 35.3 g/dL (ref 30.0–36.0)
MCV: 89.7 fl (ref 78.0–100.0)
Platelets: 210 10*3/uL (ref 150.0–400.0)
RBC: 5.28 Mil/uL (ref 4.22–5.81)
RDW: 13.5 % (ref 11.5–15.5)
WBC: 10.1 10*3/uL (ref 4.0–10.5)

## 2022-02-18 LAB — VITAMIN D 25 HYDROXY (VIT D DEFICIENCY, FRACTURES): VITD: 55.21 ng/mL (ref 30.00–100.00)

## 2022-02-18 LAB — TSH: TSH: 2.38 u[IU]/mL (ref 0.35–5.50)

## 2022-02-18 MED ORDER — FUROSEMIDE 40 MG PO TABS: 40.0000 mg | ORAL_TABLET | Freq: Every day | ORAL | 2 refills | Status: AC | PRN

## 2022-02-18 MED ORDER — NAPROXEN SODIUM 550 MG PO TABS: 550.0000 mg | ORAL_TABLET | Freq: Two times a day (BID) | ORAL | 2 refills | Status: DC | PRN

## 2022-02-18 MED ORDER — LOSARTAN POTASSIUM 50 MG PO TABS: 50.0000 mg | ORAL_TABLET | Freq: Every day | ORAL | 3 refills | Status: DC

## 2022-02-18 MED ORDER — COLCHICINE 0.6 MG PO TABS: ORAL_TABLET | ORAL | 3 refills | Status: DC

## 2022-02-18 MED ORDER — METFORMIN HCL 1000 MG PO TABS: 1000.0000 mg | ORAL_TABLET | Freq: Two times a day (BID) | ORAL | 3 refills | Status: DC

## 2022-02-18 MED ORDER — TADALAFIL 5 MG PO TABS: 5.0000 mg | ORAL_TABLET | Freq: Every day | ORAL | 2 refills | Status: DC | PRN

## 2022-02-18 MED ORDER — DAPAGLIFLOZIN PROPANEDIOL 10 MG PO TABS: 10.0000 mg | ORAL_TABLET | Freq: Every day | ORAL | 3 refills | Status: DC

## 2022-02-21 ENCOUNTER — Encounter: Payer: Self-pay | Admitting: Physical Medicine & Rehabilitation

## 2022-02-21 ENCOUNTER — Telehealth: Payer: Self-pay | Admitting: Nurse Practitioner

## 2022-02-21 NOTE — Telephone Encounter (Signed)
Patient called and is trying to get his  dapagliflozin propanediol (FARXIGA) 10 MG TABS tablet  filled at his pharmacy. His pharmacy said they need prior authorization. Pharmacy is [place], [place] - [phone] [place] [address]  Best callback for patient is [phone]

## 2022-02-23 ENCOUNTER — Other Ambulatory Visit (HOSPITAL_COMMUNITY): Payer: Self-pay

## 2022-02-23 NOTE — Telephone Encounter (Signed)
Per test claim, med was filled through insurance yesterday for 90 day supply. No PA needed at this time.

## 2022-03-02 ENCOUNTER — Other Ambulatory Visit (HOSPITAL_COMMUNITY): Payer: Self-pay

## 2022-03-08 ENCOUNTER — Other Ambulatory Visit: Payer: Self-pay | Admitting: Nurse Practitioner

## 2022-03-08 DIAGNOSIS — G8929 Other chronic pain: Secondary | ICD-10-CM

## 2022-03-09 ENCOUNTER — Telehealth: Payer: Self-pay

## 2022-03-09 NOTE — Telephone Encounter (Signed)
Pt PA for Naproxen Sodium (Key: [id number])

## 2022-03-10 ENCOUNTER — Encounter: Payer: Self-pay | Admitting: Nurse Practitioner

## 2022-03-10 ENCOUNTER — Telehealth (INDEPENDENT_AMBULATORY_CARE_PROVIDER_SITE_OTHER): Payer: 59 | Admitting: Nurse Practitioner

## 2022-03-10 DIAGNOSIS — E785 Hyperlipidemia, unspecified: Secondary | ICD-10-CM

## 2022-03-10 NOTE — Progress Notes (Signed)
   Established Patient Office Visit  An audio/visual tele-health visit was completed today for this patient. I connected with  Nathan Velez on 03/10/22 utilizing audio/visual technology and verified that I am speaking with the correct person using two identifiers. The patient was located at their vehicle, and I was located at the office of [facility] during the encounter. I discussed the limitations of evaluation and management by telemedicine. The patient expressed understanding and agreed to proceed.     Subjective   Patient ID: Nathan Velez, male    DOB: [birth year]  Age: 57 y.o. MRN: [id number]  Chief Complaint  Patient presents with   Hyperlipidemia   Lipid panel collected about 20 days ago showed LDL of 140. Patient has type 2 diabetes with A1c of 7.3.  Not currently on statin therapy.  Reports he is taking rosuvastatin in the past and has not tolerated well due to fatigue and myalgias.  Current ASCVD risk score approximately 13%.  Reports that last blood work was not in a fasting state.  Would like to repeat lipid panel in a fasting state.      ROS    Objective:     There were no vitals taken for this visit.   Physical Exam Comprehensive physical exam not completed today as office visit was conducted remotely.  Patient appears well over video.  Patient was alert and oriented, and appeared to have appropriate judgment.   No results found for any visits on 03/10/22.    The 10-year ASCVD risk score (Arnett DK, et al., 2019) is: 13%    Assessment & Plan:   Problem List Items Addressed This Visit       Other   Hyperlipidemia - Primary    Chronic, discussion had today regarding increased risk of heart attack or stroke especially based on patient's chronic type 2 diabetes.  Discussed pharmacological therapy recommendations aimed at reducing risk of heart attack and stroke and therefore reducing cholesterol.  Per shared decision making  patient would prefer to defer starting medication today but would like to repeat fasting cholesterol between now and next appointment.  I am agreeable with this and have ordered lipid panel which patient will come back to the office at a time is convenient for him in a fasting state.  The recommendations may be made based upon these results.      Relevant Orders   Lipid panel   Basic metabolic panel    Return for As scheduled, or sooner as needed.    Ailene Ards, NP

## 2022-03-10 NOTE — Assessment & Plan Note (Signed)
Chronic, discussion had today regarding increased risk of heart attack or stroke especially based on patient's chronic type 2 diabetes.  Discussed pharmacological therapy recommendations aimed at reducing risk of heart attack and stroke and therefore reducing cholesterol.  Per shared decision making patient would prefer to defer starting medication today but would like to repeat fasting cholesterol between now and next appointment.  I am agreeable with this and have ordered lipid panel which patient will come back to the office at a time is convenient for him in a fasting state.  The recommendations may be made based upon these results.

## 2022-03-10 NOTE — Addendum Note (Signed)
Addended by: Ailene Ards on: 03/10/2022 05:31 PM   Modules accepted: Level of Service

## 2022-03-11 ENCOUNTER — Encounter: Payer: Self-pay | Admitting: Physical Medicine & Rehabilitation

## 2022-03-11 ENCOUNTER — Encounter: Payer: 59 | Attending: Physical Medicine & Rehabilitation | Admitting: Physical Medicine & Rehabilitation

## 2022-03-11 VITALS — BP 126/77 | HR 79 | Ht 67.5 in | Wt 336.0 lb

## 2022-03-11 DIAGNOSIS — M25562 Pain in left knee: Secondary | ICD-10-CM | POA: Diagnosis not present

## 2022-03-11 DIAGNOSIS — M25561 Pain in right knee: Secondary | ICD-10-CM | POA: Insufficient documentation

## 2022-03-11 DIAGNOSIS — M79641 Pain in right hand: Secondary | ICD-10-CM

## 2022-03-11 DIAGNOSIS — M25579 Pain in unspecified ankle and joints of unspecified foot: Secondary | ICD-10-CM | POA: Insufficient documentation

## 2022-03-11 DIAGNOSIS — M79642 Pain in left hand: Secondary | ICD-10-CM | POA: Diagnosis present

## 2022-03-11 DIAGNOSIS — G8929 Other chronic pain: Secondary | ICD-10-CM | POA: Diagnosis present

## 2022-03-11 MED ORDER — DULOXETINE HCL 30 MG PO CPEP: 30.0000 mg | ORAL_CAPSULE | Freq: Every day | ORAL | 3 refills | Status: DC

## 2022-03-11 NOTE — Progress Notes (Unsigned)
Subjective:    Patient ID: Nathan Velez, male    DOB: [birth year], 57 y.o.   MRN: [id number]  HPI   HPI  Nathan Velez is a 57 y.o. year old male  who  has a past medical history of Bursitis, Cellulitis, Depression, Diabetes mellitus without complication ([place]), Edema, Gout, Hypercholesteremia, Hypertension, IBS (irritable bowel syndrome), Murmur, heart, Panic attacks, PTSD (post-traumatic stress disorder), Sleep apnea, Sleep apnea, and Tendinitis.   They are presenting to PM&R clinic as a new patient for pain management evaluation. They were referred by Jeralyn Ruths for treatment of knee pain.  Patient reports his pain began a 2022 when he fell while working.  Patient reports he had a Workmen's Comp. case at the time.  He says he fell on his knees, ankles and hands with worsening pain after this injury.  Patient says he was followed by [facility] where he had physical therapy for several months without resolution of his pain.  Currently his worst pain is in his right knee although he also has left knee pain.  He also has pain in his ankles and his hands.  Pain is worsened with activities.  Pain is worse when it rains.  He previously was seen by [facility] was on Suboxone which caused nausea.  He reports the doctor was going to start Percocet however he was unable to follow-up due to insurance issues.  Patient reports he had Percocet for dental infection in the past and this resolved his pain while he was taking it.  He has recently regained insurance coverage.  Patient does have a history of gout and he has had prior attacks in his first MTP joint.  Patient reports he never tried injections because he was told affect to be temporary.  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    Red flag symptoms: No red flags for back pain endorsed in Hx or ROS, saddle anesthesia, loss of bowel or bladder continence, new  weakness, new numbness/tingling, and pain waking up at nighttime.  Medications tried: Topical medications voltaren cream didn't help Nsaids - dont help, he has tried naproxone and meloxicam, aleve Tylenol - doenst hep Opiates - percocet helped his knee and ankle pain when he got it for dental issue Gabapentin / Lyrica - denies  Tramadol did not help Suboxone caused nausea    Tender knees b/l anterior and ankles  Other treatments: PT- completed with mild benefit   Prior UDS results: No results found for: "LABOPIA", "COCAINSCRNUR", "LABBENZ", "AMPHETMU", "THCU", "LABBARB"   Pain Inventory Average Pain 8 Pain Right Now 8 My pain is constant and sharp  In the last 24 hours, has pain interfered with the following? General activity 6 Relation with others 8 Enjoyment of life 6 What TIME of day is your pain at its worst? morning , daytime, and evening Sleep (in general) Poor  Pain is worse with: bending and some activites Pain improves with: heat/ice and medication Relief from Meds: 10  walk without assistance ability to climb steps?  yes do you drive?  yes Do you have any goals in this area?  yes  employed # of hrs/week 70 what is your job? [employer] shopper/[employer]  bladder control problems tingling  New patient  Primary care Jeralyn Ruths    Family History  Problem Relation Age of Onset   Osteoporosis Mother    Heart disease Mother  Hypertension Mother    Hyperlipidemia Mother    Anemia Mother    Social History   Socioeconomic History   Marital status: Divorced    Spouse name: Not on file   Number of children: Not on file   Years of education: Not on file   Highest education level: Not on file  Occupational History   Occupation: delivery driver    Employer: [employer]  Tobacco Use   Smoking status: Never   Smokeless tobacco: Never  Vaping Use   Vaping Use: Never used  Substance and Sexual Activity   Alcohol use: No   Drug use: No   Sexual  activity: Not Currently  Other Topics Concern   Not on file  Social History Narrative   Divorced.Lives with mum .Location manager for [employer].   Social Determinants of Health   Financial Resource Strain: Not on file  Food Insecurity: Not on file  Transportation Needs: Not on file  Physical Activity: Not on file  Stress: Not on file  Social Connections: Not on file   Past Surgical History:  Procedure Laterality Date   ABSCESS DRAINAGE     Past Medical History:  Diagnosis Date   Bursitis    Cellulitis    L Leg   Depression    Diabetes mellitus without complication (HCC)    Edema    Gout    Hypercholesteremia    Hypertension    IBS (irritable bowel syndrome)    Murmur, heart    Panic attacks    PTSD (post-traumatic stress disorder)    Sleep apnea    Sleep apnea    Tendinitis    BP 126/77   Pulse 79   Ht 5' 7.5" (1.715 m)   Wt (!) 336 lb (152.4 kg)   SpO2 97%   BMI 51.85 kg/m   Opioid Risk Score:   Fall Risk Score:  `1  Depression screen PHQ 2/9     02/17/2022    4:15 PM 07/02/2020    2:28 PM 04/06/2020    4:26 PM 06/12/2019    2:10 PM  Depression screen PHQ 2/9  Decreased Interest 0 0 0 0  Down, Depressed, Hopeless 0 0 0 0  PHQ - 2 Score 0 0 0 0  Altered sleeping 0 0 0   Tired, decreased energy 0 0 0   Change in appetite 0 0 0   Feeling bad or failure about yourself  0 0 0   Trouble concentrating 0 0 0   Moving slowly or fidgety/restless 0 0 0   Suicidal thoughts 0 0 0   PHQ-9 Score 0 0 0   Difficult doing work/chores Not difficult at all Not difficult at all Not difficult at all      Review of Systems  Constitutional:        Weight loss elevated blood sugar  Musculoskeletal:        B/L hand, knee and feet  All other systems reviewed and are negative.      Objective:   Physical Exam  Gen: no distress, normal appearing HEENT: oral mucosa pink and moist, NCAT Cardio: Reg rate Chest: normal effort, normal rate of breathing Abd: soft,  non-distended Ext: no edema Psych: pleasant, normal affect Skin: intact Neuro: Alert and awake, follows commands, cranial nerves II through XII grossly intact, normal speech and language -Strength5 out of 5 in all extremities Sensation intact light touch in all 4 extremities Musculoskeletal: No inflamed joints noted today Patient does  have tenderness around his patella and anterior joint line medial and lateral, pain is worse in his right knee than his left knee mild pain Noted with varus and valgus stress of his knees bilaterally Anterior posterior drawer signs negative Tenderness at bilateral ankles, pain with ankle movement in all directions Tenderness noted MCP and PIP joints throughout his hands-mild  Left wrist x-ray FINDINGS: There is no evidence of fracture or dislocation. There is no evidence of arthropathy or other focal bone abnormality. Soft tissues are unremarkable.   IMPRESSION: Negative exam.  L knee xray 2021 IMPRESSION: 1. Mild medial compartment degenerative change. No acute bony abnormality.   2.  Peripheral vascular disease.  R knee xray 2021 IMPRESSION: 1. Mild patellofemoral medial compartment degenerative change. No acute abnormality.   2.  Peripheral vascular disease.       Assessment & Plan:   Knee pain right greater than left -Appears to have mild OA on prior x-rays, patient reports he had different x-rays taken after his fall -Would like to see documentation from [facility]-patient reports more recent imaging -Will start duloxetine 30 mg daily -Discussed knee injection cortisone, patient would like to try his right knee.  Advised asking his primary care doctor regarding this due to his diabetic history -Patient was previously on Suboxone, I think we should try to exhaust nonopioid therapies before considering this type of medication -Gel injections, knee brace, genicular nerve blocks could be options if pain does not improve with  cortisone injection  B/L pain in b/l hands and ankles -No significant changes noted on x-ray from 2021 -Duloxetine as above

## 2022-03-17 NOTE — Telephone Encounter (Signed)
Pharmacy Patient Advocate Encounter  Received notification from [facility] that the request for prior authorization for Naproxen has been denied due to .    Please be advised we currently do not have a Pharmacist to review denials, therefore you will need to process appeals accordingly as needed. Thanks for your support at this time.   If appeal is needed, please see below:

## 2022-03-21 ENCOUNTER — Other Ambulatory Visit: Payer: Self-pay | Admitting: Nurse Practitioner

## 2022-03-22 ENCOUNTER — Telehealth: Payer: Self-pay | Admitting: Nurse Practitioner

## 2022-03-22 MED ORDER — TADALAFIL 5 MG PO TABS: 5.0000 mg | ORAL_TABLET | Freq: Every day | ORAL | 2 refills | Status: DC | PRN

## 2022-03-22 NOTE — Telephone Encounter (Signed)
Pt called wanting Rx refilled: tadalafil (CIALIS) 5 MG tablet   Preferred Pharmacy:   [facility] [address] [place] [address]  [address] [place], [place] [address]   Last OV: 03/10/22

## 2022-03-22 NOTE — Telephone Encounter (Signed)
Sent refill to [facility].Marland KitchenJohny Chess

## 2022-04-18 ENCOUNTER — Encounter: Payer: 59 | Admitting: Physical Medicine & Rehabilitation

## 2022-04-19 ENCOUNTER — Telehealth: Payer: Self-pay | Admitting: Nurse Practitioner

## 2022-04-19 NOTE — Telephone Encounter (Signed)
Judson Roch is out of the office today will forward msg to address when she return.Marland KitchenJohny Chess

## 2022-04-19 NOTE — Telephone Encounter (Signed)
PT calls today regarding the prescription for their tadalafil (CIALIS) 5 MG tablet . PT was wanting to know that instead of it being prescribed as 10 tablets with multiple refills, could it be sent as just one prescription for the whole month with no refills?  According to PT the pharmacy has been filling his scripts all at one time at the beginning of the month for him so that it would be cheaper for him.   I did inform him that sometimes how these medications are prescribed can be dependent on the insurance (they might not cover it if it's prescribed as getting it all at one time).  CB: [phone]

## 2022-04-20 ENCOUNTER — Other Ambulatory Visit: Payer: Self-pay | Admitting: Nurse Practitioner

## 2022-04-20 DIAGNOSIS — N529 Male erectile dysfunction, unspecified: Secondary | ICD-10-CM

## 2022-04-20 MED ORDER — TADALAFIL 5 MG PO TABS: 5.0000 mg | ORAL_TABLET | Freq: Every day | ORAL | 2 refills | Status: DC | PRN

## 2022-04-20 NOTE — Telephone Encounter (Signed)
Notified pt w/ Nathan Velez response../lmb 

## 2022-05-06 ENCOUNTER — Telehealth: Payer: Self-pay

## 2022-05-06 NOTE — Telephone Encounter (Signed)
Pt PA for Tadalafil key: [id number]

## 2022-05-19 ENCOUNTER — Ambulatory Visit (INDEPENDENT_AMBULATORY_CARE_PROVIDER_SITE_OTHER): Payer: 59 | Admitting: Nurse Practitioner

## 2022-05-19 VITALS — BP 100/70 | HR 89 | Temp 98.6°F | Ht 67.5 in | Wt 341.2 lb

## 2022-05-19 DIAGNOSIS — I10 Essential (primary) hypertension: Secondary | ICD-10-CM | POA: Diagnosis not present

## 2022-05-19 DIAGNOSIS — M1991 Primary osteoarthritis, unspecified site: Secondary | ICD-10-CM | POA: Diagnosis not present

## 2022-05-19 DIAGNOSIS — E119 Type 2 diabetes mellitus without complications: Secondary | ICD-10-CM | POA: Diagnosis not present

## 2022-05-19 DIAGNOSIS — E785 Hyperlipidemia, unspecified: Secondary | ICD-10-CM

## 2022-05-19 MED ORDER — LOSARTAN POTASSIUM 50 MG PO TABS: 50.0000 mg | ORAL_TABLET | Freq: Every day | ORAL | 1 refills | Status: DC

## 2022-05-19 MED ORDER — DAPAGLIFLOZIN PROPANEDIOL 10 MG PO TABS: 10.0000 mg | ORAL_TABLET | Freq: Every day | ORAL | 1 refills | Status: DC

## 2022-05-19 MED ORDER — METFORMIN HCL 1000 MG PO TABS
1000.0000 mg | ORAL_TABLET | Freq: Two times a day (BID) | ORAL | 1 refills | Status: DC
Start: 2022-05-19 — End: 2022-11-09

## 2022-05-19 MED ORDER — MELOXICAM 7.5 MG PO TABS: 7.5000 mg | ORAL_TABLET | Freq: Every day | ORAL | 1 refills | Status: DC

## 2022-05-19 NOTE — Assessment & Plan Note (Signed)
Chronic, Last A1c slightly above goal at 7.3 Per patient preference we will hold off on rechecking today For now continue on Farxiga 10 mg daily and metformin 1000 mg twice a day. Continue ARB Patient requesting to hold off on cholesterol-lowering medication until repeat fasting lipid panel was collected.  Will plan on doing this at next office visit.

## 2022-05-19 NOTE — Assessment & Plan Note (Signed)
Chronic,  At goal today  Continue losartan 50 mg/day

## 2022-05-19 NOTE — Progress Notes (Signed)
Established Patient Office Visit  Subjective   Patient ID: Nathan Velez, male    DOB: [birth year]  Age: 57 y.o. MRN: [id number]  Chief Complaint  Patient presents with   Medical Management of Chronic Issues    3 month follow up     Type 2 diabetes/hypertension/hyperlipidemia: Last A1c 7.3.  Patient on Farxiga 10 mg/day, metformin 1000 mg twice a day.  Also on losartan 50 mg daily.  Not on statin due to patient preference.  Last LDL above goal at 140.  I did discuss recommendation to start statin therapy but he wanted to hold off.  Patient also reports due to some financial barriers is requesting to hold off on lab work until his next appointment.   Osteoarthritis: Has chronic severe pain to bilateral knees.  Would like me to sign off on he Placard today.  Also would like to discuss restarting meloxicam.  Was referred to physical medicine and rehabilitation was recommended to start duloxetine 30 mg daily.  Patient reports he is not taking this as he is tried in the past and did not tolerate it well.    Review of Systems  Respiratory:  Negative for shortness of breath.   Cardiovascular:  Negative for chest pain.  Musculoskeletal:  Positive for joint pain.      Objective:     BP 100/70   Pulse 89   Temp 98.6 F (37 C) (Temporal)   Ht 5' 7.5" (1.715 m)   Wt (!) 341 lb 4 oz (154.8 kg)   SpO2 98%   BMI 52.66 kg/m    Physical Exam Vitals reviewed.  Constitutional:      Appearance: Normal appearance.  HENT:     Head: Normocephalic and atraumatic.  Cardiovascular:     Rate and Rhythm: Normal rate and regular rhythm.  Pulmonary:     Effort: Pulmonary effort is normal.     Breath sounds: Normal breath sounds.  Musculoskeletal:     Cervical back: Neck supple.  Skin:    General: Skin is warm and dry.  Neurological:     Mental Status: He is alert and oriented to person, place, and time.  Psychiatric:        Mood and Affect: Mood normal.        Behavior: Behavior  normal.        Thought Content: Thought content normal.        Judgment: Judgment normal.      No results found for any visits on 05/19/22.    The 10-year ASCVD risk score (Arnett DK, et al., 2019) is: 11.6%    Assessment & Plan:   Problem List Items Addressed This Visit       Cardiovascular and Mediastinum   Essential hypertension    Chronic,  At goal today  Continue losartan 50 mg/day      Relevant Medications   losartan (COZAAR) 50 MG tablet     Endocrine   DM type 2 (diabetes mellitus, type 2) (HCC) - Primary    Chronic, Last A1c slightly above goal at 7.3 Per patient preference we will hold off on rechecking today For now continue on Farxiga 10 mg daily and metformin 1000 mg twice a day. Continue ARB Patient requesting to hold off on cholesterol-lowering medication until repeat fasting lipid panel was collected.  Will plan on doing this at next office visit.      Relevant Medications   dapagliflozin propanediol (FARXIGA) 10 MG TABS tablet   losartan (  COZAAR) 50 MG tablet   metFORMIN (GLUCOPHAGE) 1000 MG tablet   Other Relevant Orders   Microalbumin / creatinine urine ratio     Musculoskeletal and Integument   Primary osteoarthritis    Chronic Patient would prefer to not follow back up with physical medicine rehabilitation due to financial barriers. Would like to start meloxicam.  Did discuss risk and benefits of long-term use of NSAIDs.  Patient reports understanding.  Start meloxicam 7.5 mg daily as needed.  Patient educated to avoid any other NSAIDs, but to take Tylenol as well if needed for additional pain management.      Relevant Medications   meloxicam (MOBIC) 7.5 MG tablet     Other   Hyperlipidemia    Chronic Last LDL above goal at 140 Patient declined starting cholesterol-lowering medication Due to financial barriers wants to have fasting lipid panel checked at next office visit Patient to follow-up in 3 months at which point he was told to  come fasting to have lipid panel checked again. Patient encouraged to follow low-fat diet between now and then.      Relevant Medications   losartan (COZAAR) 50 MG tablet    Return in about 3 months (around 08/19/2022) for F/U with Maralyn Sago.    Elenore Paddy, NP

## 2022-05-19 NOTE — Assessment & Plan Note (Signed)
Chronic Last LDL above goal at 140 Patient declined starting cholesterol-lowering medication Due to financial barriers wants to have fasting lipid panel checked at next office visit Patient to follow-up in 3 months at which point he was told to come fasting to have lipid panel checked again. Patient encouraged to follow low-fat diet between now and then.

## 2022-05-19 NOTE — Assessment & Plan Note (Signed)
Chronic Patient would prefer to not follow back up with physical medicine rehabilitation due to financial barriers. Would like to start meloxicam.  Did discuss risk and benefits of long-term use of NSAIDs.  Patient reports understanding.  Start meloxicam 7.5 mg daily as needed.  Patient educated to avoid any other NSAIDs, but to take Tylenol as well if needed for additional pain management.

## 2022-05-25 ENCOUNTER — Other Ambulatory Visit (HOSPITAL_COMMUNITY): Payer: Self-pay

## 2022-05-25 NOTE — Telephone Encounter (Signed)
Patient Advocate Encounter  Received a fax from [facility] regarding Prior Authorization for Tadalafil 5MG  tab.   Authorization has been DENIED due to    Determination attached to patient chart

## 2022-08-02 ENCOUNTER — Other Ambulatory Visit: Payer: Self-pay | Admitting: Nurse Practitioner

## 2022-08-02 DIAGNOSIS — N529 Male erectile dysfunction, unspecified: Secondary | ICD-10-CM

## 2022-08-19 ENCOUNTER — Ambulatory Visit: Payer: 59 | Admitting: Nurse Practitioner

## 2022-09-01 ENCOUNTER — Encounter (INDEPENDENT_AMBULATORY_CARE_PROVIDER_SITE_OTHER): Payer: Self-pay

## 2022-09-12 ENCOUNTER — Other Ambulatory Visit: Payer: Self-pay | Admitting: Nurse Practitioner

## 2022-09-12 DIAGNOSIS — N529 Male erectile dysfunction, unspecified: Secondary | ICD-10-CM

## 2022-09-23 ENCOUNTER — Ambulatory Visit: Payer: 59 | Admitting: Nurse Practitioner

## 2022-10-11 ENCOUNTER — Ambulatory Visit: Payer: 59 | Admitting: Family Medicine

## 2022-10-13 ENCOUNTER — Other Ambulatory Visit: Payer: Self-pay

## 2022-10-13 ENCOUNTER — Encounter (HOSPITAL_COMMUNITY): Payer: Self-pay | Admitting: Emergency Medicine

## 2022-10-13 ENCOUNTER — Emergency Department (HOSPITAL_COMMUNITY): Payer: 59

## 2022-10-13 ENCOUNTER — Emergency Department (HOSPITAL_COMMUNITY)
Admission: EM | Admit: 2022-10-13 | Discharge: 2022-10-13 | Disposition: A | Discharge status: Home / Self Care | Payer: 59

## 2022-10-13 DIAGNOSIS — I10 Essential (primary) hypertension: Secondary | ICD-10-CM | POA: Diagnosis not present

## 2022-10-13 DIAGNOSIS — Z7984 Long term (current) use of oral hypoglycemic drugs: Secondary | ICD-10-CM | POA: Diagnosis not present

## 2022-10-13 DIAGNOSIS — Z79899 Other long term (current) drug therapy: Secondary | ICD-10-CM | POA: Insufficient documentation

## 2022-10-13 DIAGNOSIS — F419 Anxiety disorder, unspecified: Secondary | ICD-10-CM | POA: Insufficient documentation

## 2022-10-13 DIAGNOSIS — Z1152 Encounter for screening for COVID-19: Secondary | ICD-10-CM | POA: Diagnosis not present

## 2022-10-13 DIAGNOSIS — E860 Dehydration: Secondary | ICD-10-CM | POA: Insufficient documentation

## 2022-10-13 DIAGNOSIS — R519 Headache, unspecified: Secondary | ICD-10-CM | POA: Diagnosis present

## 2022-10-13 LAB — BASIC METABOLIC PANEL
Anion gap: 14 (ref 5–15)
BUN: 25 mg/dL — ABNORMAL HIGH (ref 6–20)
CO2: 19 mmol/L — ABNORMAL LOW (ref 22–32)
Calcium: 9.3 mg/dL (ref 8.9–10.3)
Chloride: 100 mmol/L (ref 98–111)
Creatinine, Ser: 1.49 mg/dL — ABNORMAL HIGH (ref 0.61–1.24)
GFR, Estimated: 55 mL/min — ABNORMAL LOW (ref >60)
Glucose, Bld: 142 mg/dL — ABNORMAL HIGH (ref 70–99)
Potassium: 4.6 mmol/L (ref 3.5–5.1)
Sodium: 133 mmol/L — ABNORMAL LOW (ref 135–145)

## 2022-10-13 LAB — TROPONIN I (HIGH SENSITIVITY)
Troponin I (High Sensitivity): 3 ng/L (ref <18)
Troponin I (High Sensitivity): 3 ng/L (ref <18)

## 2022-10-13 LAB — URINALYSIS, ROUTINE W REFLEX MICROSCOPIC
Bacteria, UA: NONE SEEN
Bilirubin Urine: NEGATIVE
Glucose, UA: >=500 mg/dL — AB
Hgb urine dipstick: NEGATIVE
Ketones, ur: 5 mg/dL — AB
Leukocytes,Ua: NEGATIVE
Nitrite: NEGATIVE
Protein, ur: NEGATIVE mg/dL
Specific Gravity, Urine: 1.012 (ref 1.005–1.030)
pH: 5 (ref 5.0–8.0)

## 2022-10-13 LAB — CBC
HCT: 50.9 % (ref 39.0–52.0)
Hemoglobin: 17.3 g/dL — ABNORMAL HIGH (ref 13.0–17.0)
MCH: 30.4 pg (ref 26.0–34.0)
MCHC: 34 g/dL (ref 30.0–36.0)
MCV: 89.5 fL (ref 80.0–100.0)
Platelets: 255 10*3/uL (ref 150–400)
RBC: 5.69 MIL/uL (ref 4.22–5.81)
RDW: 12.5 % (ref 11.5–15.5)
WBC: 9.2 10*3/uL (ref 4.0–10.5)
nRBC: 0 % (ref 0.0–0.2)

## 2022-10-13 LAB — SARS CORONAVIRUS 2 BY RT PCR: SARS Coronavirus 2 by RT PCR: NEGATIVE

## 2022-10-13 LAB — CBG MONITORING, ED: Glucose-Capillary: 147 mg/dL — ABNORMAL HIGH (ref 70–99)

## 2022-10-13 MED ORDER — IOHEXOL 350 MG/ML SOLN
75.0000 mL | Freq: Once | INTRAVENOUS | Status: AC | PRN
  Administered 2022-10-13: 75 mL via INTRAVENOUS

## 2022-10-13 MED ORDER — HYDROXYZINE HCL 25 MG PO TABS: 25.0000 mg | ORAL_TABLET | Freq: Three times a day (TID) | ORAL | 0 refills | Status: DC | PRN

## 2022-10-13 MED ORDER — SODIUM CHLORIDE 0.9 % IV BOLUS
1000.0000 mL | Freq: Once | INTRAVENOUS | Status: AC
  Administered 2022-10-13: 1000 mL via INTRAVENOUS

## 2022-10-13 NOTE — ED Provider Triage Note (Signed)
Emergency Medicine Provider Triage Evaluation Note  Nathan Velez , a 57 y.o. male  was evaluated in triage.  Pt complains of shortness of breath, chest pain.  Patient states that symptoms began when he was walking around at [place].  Reports left-sided chest pain with radiation down his left arm as well as his left neck.  Reports some numbness in his left forearm area.  States that symptoms are worsened when he was walking around.  States he took 2 aspirin prior to coming to the emergency department which seemed to help his symptoms.  Currently without chest pain.  Review of Systems  Positive: See above Negative:   Physical Exam  BP 114/71 (BP Location: Right Arm)   Pulse 76   Temp 98.5 F (36.9 C)   Resp 18   Ht 5\' 9"  (1.753 m)   Wt (!) 142.8 kg   SpO2 96%   BMI 46.50 kg/m  Gen:   Awake, no distress   Resp:  Normal effort  MSK:   Moves extremities without difficulty  Other:    Medical Decision Making  Medically screening exam initiated at 6:07 PM.  Appropriate orders placed.  Nathan Velez was informed that the remainder of the evaluation will be completed by another provider, this initial triage assessment does not replace that evaluation, and the importance of remaining in the ED until their evaluation is complete.     Peter Garter, [place] 10/13/22 [phone]

## 2022-10-13 NOTE — ED Provider Notes (Signed)
EMERGENCY DEPARTMENT AT [facility] Provider Note   CSN: [id number] Arrival date & time: 10/13/22  1350     History  Chief Complaint  Patient presents with   Weakness    Nathan Velez is a 57 y.o. male.  57 year old male with past medical history of diabetes and hypertension as well as anxiety presenting to the emergency department today with multiple symptoms.  The patient states that he woke up early this morning and was having some chest pressure as well as some tingling in his left arm.  He states that this went on through the morning when he woke up.  He states that at around 10:30 AM he developed a left-sided headache that he describes as a 7 out of 10 in intensity that lasted for 10 minutes.  This was not necessarily associated with the arm tingling.  He states that as the day is gone on the symptoms have resolved but he was concerned about the chest discomfort so he came to the emergency department for further evaluation.  He denies any headache currently.  He states that he did have some redness of his left lower extremity over the foot started last week and did have some leftover Bactrim that he took for 5 days.  Reports that this has since resolved was unclear if this was contributory.  He states that the redness was confined to the foot at that time due to a scratch on one of his toes.  He came to the emergency department today for further evaluation.  He states that he has been under a lot of stress recently due to the death of a loved 1 as well as some financial stressors.   Weakness Associated symptoms: headaches        Home Medications Prior to Admission medications   Medication Sig Start Date End Date Taking? Authorizing Provider  hydrOXYzine (ATARAX) 25 MG tablet Take 1 tablet (25 mg total) by mouth every 8 (eight) hours as needed for anxiety. 10/13/22  Yes Durwin Glaze, MD  Acetaminophen (TYLENOL) 325 MG CAPS Take 650 mg by mouth every 6  (six) hours as needed.    [provider]  allopurinol (ZYLOPRIM) 300 MG tablet Take 300 mg by mouth daily. Only taking after acute attacks    [provider]  colchicine 0.6 MG tablet Take for acute gout flare. Take 2 tablets by mouth once. Take 1 additional tablet by mouth in 1 hour if pain persists. 02/18/22   Elenore Paddy, NP  dapagliflozin propanediol (FARXIGA) 10 MG TABS tablet Take 1 tablet (10 mg total) by mouth daily. 05/19/22   Elenore Paddy, NP  furosemide (LASIX) 40 MG tablet Take 1 tablet (40 mg total) by mouth daily as needed for edema. 02/18/22   Elenore Paddy, NP  losartan (COZAAR) 50 MG tablet Take 1 tablet (50 mg total) by mouth daily. 05/19/22   Elenore Paddy, NP  meloxicam (MOBIC) 7.5 MG tablet Take 1 tablet (7.5 mg total) by mouth daily. 05/19/22   Elenore Paddy, NP  metFORMIN (GLUCOPHAGE) 1000 MG tablet Take 1 tablet (1,000 mg total) by mouth 2 (two) times daily. 05/19/22   Elenore Paddy, NP  Multiple Vitamins-Minerals (EMERGEN-C IMMUNE) PACK Take 1 packet by mouth daily as needed.    [provider]  Omega-3 Fatty Acids (FISH OIL) 1000 MG CAPS Take 2 capsules by mouth 2 (two) times daily.    [provider]  OVER  THE COUNTER MEDICATION Take 500 mg by mouth daily. Potassium OTC supplement    [provider]  tadalafil (CIALIS) 5 MG tablet TAKE 1 TABLET BY MOUTH ONCE DAILY AS NEEDED FOR ERECTILE DYSFUNCTION 09/12/22   Elenore Paddy, NP  Vitamin D-Vitamin K (K2 PLUS D3 PO) Take by mouth. 10,000IUS of D3 and 90 of K2 once per day    [provider]  vitamin E 600 UNIT capsule Take 1,000 Units by mouth in the morning and at bedtime.    [provider]      Allergies    Lisinopril and Penicillins    Review of Systems   Review of Systems  Respiratory:  Positive for chest tightness.   Neurological:  Positive for weakness and headaches.  All other systems reviewed and are negative.   Physical Exam Updated Vital Signs BP  107/74 (BP Location: Left Arm)   Pulse 72   Temp 98.9 F (37.2 C) (Oral)   Resp 14   Ht 5\' 9"  (1.753 m)   Wt (!) 142.8 kg   SpO2 98%   BMI 46.50 kg/m  Physical Exam Vitals and nursing note reviewed.   Gen: NAD Eyes: PERRL, EOMI HEENT: no oropharyngeal swelling Neck: trachea midline Resp: clear to auscultation bilaterally Card: RRR, no murmurs, rubs, or gallops Abd: nontender, nondistended Extremities: no calf tenderness, no edema Vascular: 2+ radial pulses bilaterally, 2+ DP pulses bilaterally Neuro: NIH stroke scale of 0, equal strength sensation throughout bilateral upper and lower extremities Skin: no rashes, no erythema, no cellulitis noted Psyc: acting appropriately   ED Results / Procedures / Treatments   Labs (all labs ordered are listed, but only abnormal results are displayed) Labs Reviewed  BASIC METABOLIC PANEL - Abnormal; Notable for the following components:      Result Value   Sodium 133 (*)    CO2 19 (*)    Glucose, Bld 142 (*)    BUN 25 (*)    Creatinine, Ser 1.49 (*)    GFR, Estimated 55 (*)    All other components within normal limits  CBC - Abnormal; Notable for the following components:   Hemoglobin 17.3 (*)    All other components within normal limits  URINALYSIS, ROUTINE W REFLEX MICROSCOPIC - Abnormal; Notable for the following components:   Glucose, UA >=500 (*)    Ketones, ur 5 (*)    All other components within normal limits  CBG MONITORING, ED - Abnormal; Notable for the following components:   Glucose-Capillary 147 (*)    All other components within normal limits  SARS CORONAVIRUS 2 BY RT PCR  TROPONIN I (HIGH SENSITIVITY)  TROPONIN I (HIGH SENSITIVITY)    EKG EKG Interpretation Date/Time:  Thursday October 13 2022 14:41:07 EDT Ventricular Rate:  77 PR Interval:  192 QRS Duration:  76 QT Interval:  374 QTC Calculation: 423 R Axis:   41  Text Interpretation: Normal sinus rhythm Low voltage QRS Borderline ECG No previous  ECGs available Confirmed by Beckey Downing [phone]) on 10/13/2022 7:24:50 PM  Radiology CT Angio Head W or Wo Contrast  Result Date: 10/13/2022 CLINICAL DATA:  Initial evaluation for acute headache, left-sided numbness. EXAM: CT ANGIOGRAPHY HEAD TECHNIQUE: Multidetector CT imaging of the head was performed using the standard protocol during bolus administration of intravenous contrast. Multiplanar CT image reconstructions and MIPs were obtained to evaluate the vascular anatomy. RADIATION DOSE REDUCTION: This exam was performed according to the departmental dose-optimization program which includes automated exposure control, adjustment  of the mA and/or kV according to patient size and/or use of iterative reconstruction technique. CONTRAST:  75mL OMNIPAQUE IOHEXOL 350 MG/ML SOLN COMPARISON:  None Available. FINDINGS: CT HEAD Brain: Cerebral volume within normal limits. No acute intracranial hemorrhage. No acute large vessel territory infarct. No mass lesion or midline shift. No hydrocephalus or extra-axial fluid collection. Vascular: No abnormal hyperdense vessel. Scattered vascular calcifications noted within the carotid siphons. Skull: Scalp soft tissues demonstrate no acute finding. Calvarium intact. Sinuses: Globes and orbital soft tissues within normal limits. Moderate mucosal thickening about the right sphenoid sinus, chronic in appearance. Paranasal sinuses are otherwise clear. No mastoid effusion. Other: None. CTA HEAD Anterior circulation: Atheromatous change about the carotid siphons without hemodynamically significant stenosis. A1 segments, anterior communicating artery complex, and anterior cerebral arteries patent without stenosis. No M1 stenosis or occlusion. Distal MCA branches perfused and symmetric. Posterior circulation: Both V4 segments widely patent. Neither PICA origin well visualized. Basilar patent without stenosis. Superior cerebellar and posterior cerebral arteries patent bilaterally. Venous  sinuses: Patent allowing for timing the contrast bolus. Anatomic variants: None significant.  No aneurysm. Review of the MIP images confirms the above findings. IMPRESSION: 1. Negative CTA of the head and neck. No large vessel occlusion or other acute abnormality. No aneurysm. 2. Atheromatous change about the carotid siphons without hemodynamically significant stenosis. 3. No other acute intracranial abnormality. Electronically Signed   By: Rise Mu M.D.   On: 10/13/2022 22:03   DG Chest 2 View  Result Date: 10/13/2022 CLINICAL DATA:  Weakness and left arm numbness. EXAM: CHEST - 2 VIEW COMPARISON:  Jun 12, 2019 FINDINGS: The heart size and mediastinal contours are within normal limits. Both lungs are clear. The visualized skeletal structures are unremarkable. IMPRESSION: No active cardiopulmonary disease. Electronically Signed   By: Aram Candela M.D.   On: 10/13/2022 20:40    Procedures Procedures    Medications Ordered in ED Medications  sodium chloride 0.9 % bolus 1,000 mL (0 mLs Intravenous Stopped 10/13/22 2153)  iohexol (OMNIPAQUE) 350 MG/ML injection 75 mL (75 mLs Intravenous Contrast Given 10/13/22 2018)    ED Course/ Medical Decision Making/ A&P                                 Medical Decision Making 57 year old male with past medical history of diabetes, hypertension, and hyperlipidemia presenting to the emergency department today with multiple complaints.  Seems that these are not really temporally related.  In regards to his chest discomfort I will further evaluate him here with basic labs Wels and EKG, chest x-ray, and troponin for further evaluation for ACS, pulmonary edema, pulmonary infiltrates, or pneumothorax.  The patient's heart rate is within normal limits and he is not hypoxic here.  He denies any pleuritic chest pain so suspicion for pulmonary embolism is low at this time.  With his symptoms resolving suspicion for aortic dissection is also low and he is  also neurovascularly intact here on exam.  In regards to his headache I will obtain a CT angiogram here to eval for intracranial hemorrhage or mass lesions.  Based on description of his symptoms this does not seem totally consistent with subarachnoid hemorrhage.  He does not have any findings on exam consistent with meningitis.  He reports that the symptoms have also resolved.  This may have been due to complex migraine but this of course is diagnosis of exclusion.  I will give patient  IV fluids and reevaluate for ultimate disposition.  In regards to the cellulitis it appears that the antibiotics patient took did resolve this because I do not appreciate any findings consistent with this on exam.  The patient's EKG interpreted by me shows a sinus rhythm with a rate of 77 with normal axis, normal intervals, no significant ST-T changes.  Patient's work appears reassuring.  He does have an AKI here.  He did receive IV fluids here.  He is tolerating p.o.'s I think that he can push p.o. fluids and follow-up.  I did discuss this with the patient.  He is requesting something for anxiety.  I will give him hydroxyzine to go home with.  He is discharged with return precautions.  CT angiogram is negative.  Amount and/or Complexity of Data Reviewed Labs: ordered. Radiology: ordered.  Risk Prescription drug management.           Final Clinical Impression(s) / ED Diagnoses Final diagnoses:  Dehydration  Nonintractable headache, unspecified chronicity pattern, unspecified headache type  Anxiety    Rx / DC Orders ED Discharge Orders          Ordered    hydrOXYzine (ATARAX) 25 MG tablet  Every 8 hours PRN        10/13/22 2256              Durwin Glaze, MD 10/13/22 2257

## 2022-10-13 NOTE — ED Notes (Signed)
Called lab for troponin  Lab tech has trop Will need delivered to lab to run

## 2022-10-13 NOTE — ED Triage Notes (Signed)
Pt presents with weakness, left arm numbness and left-side headache, that started last night, history of diabetes.

## 2022-10-13 NOTE — Discharge Instructions (Signed)
Please drink plenty of fluids and follow-up with your doctor to have your kidney test rechecked.  You did appear to be dehydrated today.  Please take the hydroxyzine as needed for anxiety.  Return to the emergency department for worsening symptoms.

## 2022-10-20 ENCOUNTER — Ambulatory Visit: Payer: 59 | Admitting: Nurse Practitioner

## 2022-10-20 VITALS — BP 128/76 | HR 68 | Temp 97.7°F | Ht 69.0 in | Wt 319.4 lb

## 2022-10-20 DIAGNOSIS — E785 Hyperlipidemia, unspecified: Secondary | ICD-10-CM

## 2022-10-20 DIAGNOSIS — R200 Anesthesia of skin: Secondary | ICD-10-CM

## 2022-10-20 DIAGNOSIS — E119 Type 2 diabetes mellitus without complications: Secondary | ICD-10-CM

## 2022-10-20 DIAGNOSIS — Z7984 Long term (current) use of oral hypoglycemic drugs: Secondary | ICD-10-CM

## 2022-10-20 DIAGNOSIS — I10 Essential (primary) hypertension: Secondary | ICD-10-CM

## 2022-10-20 DIAGNOSIS — F419 Anxiety disorder, unspecified: Secondary | ICD-10-CM | POA: Diagnosis not present

## 2022-10-20 DIAGNOSIS — L0390 Cellulitis, unspecified: Secondary | ICD-10-CM

## 2022-10-20 LAB — COMPREHENSIVE METABOLIC PANEL
ALT: 18 U/L (ref 0–53)
AST: 18 U/L (ref 0–37)
Albumin: 4.4 g/dL (ref 3.5–5.2)
Alkaline Phosphatase: 48 U/L (ref 39–117)
BUN: 18 mg/dL (ref 6–23)
CO2: 26 meq/L (ref 19–32)
Calcium: 9.4 mg/dL (ref 8.4–10.5)
Chloride: 102 meq/L (ref 96–112)
Creatinine, Ser: 1.02 mg/dL (ref 0.40–1.50)
GFR: 82.17 mL/min (ref >60.00)
Glucose, Bld: 127 mg/dL — ABNORMAL HIGH (ref 70–99)
Potassium: 4.5 meq/L (ref 3.5–5.1)
Sodium: 136 meq/L (ref 135–145)
Total Bilirubin: 1.7 mg/dL — ABNORMAL HIGH (ref 0.2–1.2)
Total Protein: 7.5 g/dL (ref 6.0–8.3)

## 2022-10-20 LAB — LIPID PANEL
Cholesterol: 195 mg/dL (ref 0–200)
HDL: 40.5 mg/dL (ref >39.00)
LDL Cholesterol: 115 mg/dL — ABNORMAL HIGH (ref 0–99)
NonHDL: 154.12
Total CHOL/HDL Ratio: 5
Triglycerides: 197 mg/dL — ABNORMAL HIGH (ref 0.0–149.0)
VLDL: 39.4 mg/dL (ref 0.0–40.0)

## 2022-10-20 MED ORDER — SULFAMETHOXAZOLE-TRIMETHOPRIM 800-160 MG PO TABS: 1.0000 | ORAL_TABLET | Freq: Two times a day (BID) | ORAL | 0 refills | Status: DC

## 2022-10-20 MED ORDER — EZETIMIBE 10 MG PO TABS
10.0000 mg | ORAL_TABLET | Freq: Every day | ORAL | 0 refills | Status: DC
Start: 2022-10-20 — End: 2023-05-11

## 2022-10-20 MED ORDER — HYDROXYZINE HCL 25 MG PO TABS: 25.0000 mg | ORAL_TABLET | Freq: Three times a day (TID) | ORAL | 2 refills | Status: DC | PRN

## 2022-10-20 NOTE — Assessment & Plan Note (Signed)
Intermittent Favor pinched nerve versus TIA.  Will start on Zetia for cholesterol lowering therapy to reduce risk of TIA or stroke. However, I think this is most likely a pinched nerve.  Recommend conservative treatment with Tylenol, heat, rest.  Follow-up in 6 to 8 weeks for close monitoring.

## 2022-10-20 NOTE — Assessment & Plan Note (Signed)
Chronic Check A1c, further conditions may be made based upon the results In the meantime continue Farxiga 10 mg daily and metformin 1000 mg twice a day. Continue losartan. Per shared decision making we will start patient on cholesterol-lowering medication, because he has not tolerated statin therapy in the past we will trial Zetia.  Patient to follow-up in 6 to 8 weeks.

## 2022-10-20 NOTE — Assessment & Plan Note (Signed)
Chronic, intermittent Hydroxyzine has helped him with anxiety. Prescription for hydroxyzine 25 mg tablet to be taken every 8 hours as needed for anxiety sent to pharmacy.  Patient educated not to drive or operate heavy machinery while taking this.

## 2022-10-20 NOTE — Progress Notes (Signed)
Established Patient Office Visit  Subjective   Patient ID: Nathan Velez, male    DOB: 05-26-1965  Age: 57 y.o. MRN: [id number]  Chief Complaint  Patient presents with   Hyperlipidemia   Estimated Creatinine Clearance: 78.6 mL/min (A) (by C-G formula based on SCr of 1.49 mg/dL (H)).   ER Follow-up/Anxiety: Was seen in the ER about 1 week ago.  Evaluation identified dehydration.  He is also experiencing severe anxiety and was discharged on hydroxyzine as needed.  He reports that he did try this medication was quite helpful for anxiety.  He would like a refill if possible.  Since discharge he is also noticed a numbness to his left arm that comes and goes.  He has been exceptionally stressed recently due to interpersonal relationship conflicts at home and a loss of a loved 1.  Numbness episodes last for 5 to 10 minutes before they resolve spontaneously.  They are somewhat positional as well.   T2DM/HLD/HTN: Last A1c 7.3, he is due to have this redrawn today.  Last LDL 140.  He is fasting today.  Currently on Farxiga 10 mg daily, losartan 50 mg daily, metformin 1000 mg twice a day.  Feels that he does have cellulitis of left foot that he self treated with Bactrim at home.  Reports having tried statin therapy in the past but this caused erectile dysfunction so he discontinued it.     ROS: see HPI    Objective:     BP 128/76   Pulse 68   Temp 97.7 F (36.5 C) (Temporal)   Ht 5\' 9"  (1.753 m)   Wt (!) 319 lb 6 oz (144.9 kg)   SpO2 98%   BMI 47.16 kg/m  BP Readings from Last 3 Encounters:  10/20/22 128/76  10/13/22 112/74  05/19/22 100/70   Wt Readings from Last 3 Encounters:  10/20/22 (!) 319 lb 6 oz (144.9 kg)  10/13/22 (!) 314 lb 14.4 oz (142.8 kg)  05/19/22 (!) 341 lb 4 oz (154.8 kg)      Physical Exam Vitals reviewed.  Constitutional:      Appearance: Normal appearance.  HENT:     Head: Normocephalic and atraumatic.  Cardiovascular:     Rate and Rhythm: Normal  rate and regular rhythm.  Pulmonary:     Effort: Pulmonary effort is normal.     Breath sounds: Normal breath sounds.  Musculoskeletal:     Cervical back: Neck supple.  Skin:    General: Skin is warm and dry.  Neurological:     Mental Status: He is alert and oriented to person, place, and time.  Psychiatric:        Mood and Affect: Mood normal.        Behavior: Behavior normal.        Thought Content: Thought content normal.        Judgment: Judgment normal.    Diabetic Foot Exam - Simple   Simple Foot Form Diabetic Foot exam was performed with the following findings: Yes 10/20/2022  3:50 PM  Visual Inspection See comments: Yes Sensation Testing Intact to touch and monofilament testing bilaterally: Yes Pulse Check See comments: Yes Comments Decreased bilateral dorsalis pedis pulses.  Left foot does have redness and swelling identified on exam today.      No results found for any visits on 10/20/22.    The 10-year ASCVD risk score (Arnett DK, et al., 2019) is: 17.5%    Assessment & Plan:   Problem List  Items Addressed This Visit       Cardiovascular and Mediastinum   Essential hypertension    Chronic, stable, controlled Continue furosemide 40 mg daily and losartan 50 mg daily.      Relevant Medications   ezetimibe (ZETIA) 10 MG tablet     Endocrine   DM type 2 (diabetes mellitus, type 2) (HCC) - Primary    Chronic Check A1c, further conditions may be made based upon the results In the meantime continue Farxiga 10 mg daily and metformin 1000 mg twice a day. Continue losartan. Per shared decision making we will start patient on cholesterol-lowering medication, because he has not tolerated statin therapy in the past we will trial Zetia.  Patient to follow-up in 6 to 8 weeks.      Relevant Orders   Comprehensive metabolic panel   Hemoglobin A1c     Other   Cellulitis    Acute Left foot does look red and swollen Will extend treatment with Bactrim for  another 7 to 14 days.  Prescription sent to pharmacy.      Relevant Medications   sulfamethoxazole-trimethoprim (BACTRIM DS) 800-160 MG tablet   Hyperlipidemia    Chronic Check fasting lipid panel today Will start patient on Zetia since he has not tolerated statin therapy in the past. Patient to follow-up in 6 to 8 weeks.      Relevant Medications   ezetimibe (ZETIA) 10 MG tablet   Other Relevant Orders   Lipid panel   Anxiety    Chronic, intermittent Hydroxyzine has helped him with anxiety. Prescription for hydroxyzine 25 mg tablet to be taken every 8 hours as needed for anxiety sent to pharmacy.  Patient educated not to drive or operate heavy machinery while taking this.      Relevant Medications   hydrOXYzine (ATARAX) 25 MG tablet   Numbness    Intermittent Favor pinched nerve versus TIA.  Will start on Zetia for cholesterol lowering therapy to reduce risk of TIA or stroke. However, I think this is most likely a pinched nerve.  Recommend conservative treatment with Tylenol, heat, rest.  Follow-up in 6 to 8 weeks for close monitoring.       Return in about 8 weeks (around 12/15/2022) for F/U with Laritza Vokes.    Elenore Paddy, NP

## 2022-10-20 NOTE — Assessment & Plan Note (Signed)
Acute Left foot does look red and swollen Will extend treatment with Bactrim for another 7 to 14 days.  Prescription sent to pharmacy.

## 2022-10-20 NOTE — Assessment & Plan Note (Signed)
Chronic, stable, controlled Continue furosemide 40 mg daily and losartan 50 mg daily.

## 2022-10-20 NOTE — Assessment & Plan Note (Signed)
Chronic Check fasting lipid panel today Will start patient on Zetia since he has not tolerated statin therapy in the past. Patient to follow-up in 6 to 8 weeks.

## 2022-10-21 LAB — HEMOGLOBIN A1C: Hgb A1c MFr Bld: 6.8 % — ABNORMAL HIGH (ref 4.6–6.5)

## 2022-10-28 ENCOUNTER — Telehealth: Payer: Self-pay

## 2022-10-28 NOTE — Telephone Encounter (Signed)
Pharmacy Patient Advocate Encounter   Received notification from CoverMyMeds that prior authorization for Ezetimibe 10MG  tablets is required/requested.   Insurance verification completed.   The patient is insured through [facility] .   Per test claim: PA required; PA submitted to [employer] [facility] via CoverMyMeds Key/confirmation #/EOC [id number] Status is pending

## 2022-10-31 ENCOUNTER — Other Ambulatory Visit (HOSPITAL_COMMUNITY): Payer: Self-pay

## 2022-10-31 NOTE — Telephone Encounter (Signed)
Pharmacy Patient Advocate Encounter  Received notification from [facility] that Prior Authorization for Ezetimibe 10MG  tablets  has been APPROVED from 10/29/22 to 10/29/23. Ran test claim, Copay is $0. This test claim was processed through [facility]- copay amounts may vary at other pharmacies due to pharmacy/plan contracts, or as the patient moves through the different stages of their insurance plan.   PA #/Case ID/Reference #: [id number]

## 2022-11-07 ENCOUNTER — Telehealth: Payer: Self-pay | Admitting: Nurse Practitioner

## 2022-11-07 NOTE — Telephone Encounter (Signed)
Prescription Request  11/07/2022  LOV: 10/20/2022  What is the name of the medication or equipment? Metformin, losartan and tadilifil  Have you contacted your pharmacy to request a refill? Yes   Which pharmacy would you like this sent to?  [facility] [address], [place] - [address] [place] [address] Phone: [phone] Fax: [phone]   Patient notified that their request is being sent to the clinical staff for review and that they should receive a response within 2 business days.   Please advise at Mobile [phone] (mobile)

## 2022-11-09 ENCOUNTER — Encounter: Payer: Self-pay | Admitting: Radiology

## 2022-11-09 ENCOUNTER — Other Ambulatory Visit: Payer: Self-pay | Admitting: Radiology

## 2022-11-09 DIAGNOSIS — I10 Essential (primary) hypertension: Secondary | ICD-10-CM

## 2022-11-09 DIAGNOSIS — E119 Type 2 diabetes mellitus without complications: Secondary | ICD-10-CM

## 2022-11-09 DIAGNOSIS — N529 Male erectile dysfunction, unspecified: Secondary | ICD-10-CM

## 2022-11-09 MED ORDER — TADALAFIL 5 MG PO TABS: 5.0000 mg | ORAL_TABLET | Freq: Every day | ORAL | 0 refills | Status: DC | PRN

## 2022-11-09 MED ORDER — LOSARTAN POTASSIUM 50 MG PO TABS: 50.0000 mg | ORAL_TABLET | Freq: Every day | ORAL | 1 refills | Status: DC

## 2022-11-09 MED ORDER — METFORMIN HCL 1000 MG PO TABS: 1000.0000 mg | ORAL_TABLET | Freq: Two times a day (BID) | ORAL | 1 refills | Status: DC

## 2022-12-22 ENCOUNTER — Ambulatory Visit: Payer: 59 | Admitting: Nurse Practitioner

## 2023-01-23 ENCOUNTER — Other Ambulatory Visit: Payer: Self-pay | Admitting: Nurse Practitioner

## 2023-01-23 DIAGNOSIS — E119 Type 2 diabetes mellitus without complications: Secondary | ICD-10-CM

## 2023-01-23 MED ORDER — DAPAGLIFLOZIN PROPANEDIOL 10 MG PO TABS: 10.0000 mg | ORAL_TABLET | Freq: Every day | ORAL | 1 refills | Status: DC

## 2023-01-23 NOTE — Telephone Encounter (Signed)
 Copied from CRM [id number]. Topic: Clinical - Medication Refill >> Jan 23, 2023  9:23 AM Benton KIDD wrote: Most Recent Primary Care Visit:  Provider: ELNOR LAURAINE BRAVO  Department: [facility]  Visit Type: OFFICE VISIT  Date: 10/20/2022  Medication: ***  Has the patient contacted their pharmacy?  (Agent: If no, request that the patient contact the pharmacy for the refill. If patient does not wish to contact the pharmacy document the reason why and proceed with request.) (Agent: If yes, when and what did the pharmacy advise?)  Is this the correct pharmacy for this prescription?  If no, delete pharmacy and type the correct one.  This is the patient's preferred pharmacy:  [facility] [address], [place] - [address] [place] [address] [place] [address] Phone: [phone] Fax: [phone]  [facility] [place], [place] - [address], [place] [address] [place] [address] [place] [address] Phone: [phone] Fax: [phone]   Has the prescription been filled recently?   Is the patient out of the medication?   Has the patient been seen for an appointment in the last year OR does the patient have an upcoming appointment?   Can we respond through MyChart?   Agent: Please be advised that Rx refills may take up to 3 business days. We ask that you follow-up with your pharmacy.

## 2023-05-11 ENCOUNTER — Other Ambulatory Visit: Payer: Self-pay | Admitting: Nurse Practitioner

## 2023-05-11 DIAGNOSIS — E785 Hyperlipidemia, unspecified: Secondary | ICD-10-CM

## 2023-08-04 ENCOUNTER — Encounter: Payer: Self-pay | Admitting: Advanced Practice Midwife

## 2023-09-08 ENCOUNTER — Other Ambulatory Visit: Payer: Self-pay

## 2023-09-08 ENCOUNTER — Emergency Department (HOSPITAL_COMMUNITY): Payer: Self-pay

## 2023-09-08 ENCOUNTER — Emergency Department (HOSPITAL_COMMUNITY)
Admission: EM | Admit: 2023-09-08 | Discharge: 2023-09-08 | Disposition: A | Discharge status: Home / Self Care | Payer: Self-pay | Attending: Emergency Medicine | Admitting: Emergency Medicine

## 2023-09-08 ENCOUNTER — Encounter (HOSPITAL_COMMUNITY): Payer: Self-pay

## 2023-09-08 DIAGNOSIS — Z7984 Long term (current) use of oral hypoglycemic drugs: Secondary | ICD-10-CM | POA: Insufficient documentation

## 2023-09-08 DIAGNOSIS — S93401A Sprain of unspecified ligament of right ankle, initial encounter: Secondary | ICD-10-CM | POA: Insufficient documentation

## 2023-09-08 DIAGNOSIS — E119 Type 2 diabetes mellitus without complications: Secondary | ICD-10-CM | POA: Insufficient documentation

## 2023-09-08 DIAGNOSIS — M545 Low back pain, unspecified: Secondary | ICD-10-CM | POA: Insufficient documentation

## 2023-09-08 DIAGNOSIS — Y9301 Activity, walking, marching and hiking: Secondary | ICD-10-CM | POA: Insufficient documentation

## 2023-09-08 DIAGNOSIS — M25511 Pain in right shoulder: Secondary | ICD-10-CM | POA: Insufficient documentation

## 2023-09-08 DIAGNOSIS — W010XXA Fall on same level from slipping, tripping and stumbling without subsequent striking against object, initial encounter: Secondary | ICD-10-CM | POA: Insufficient documentation

## 2023-09-08 DIAGNOSIS — I10 Essential (primary) hypertension: Secondary | ICD-10-CM | POA: Insufficient documentation

## 2023-09-08 DIAGNOSIS — Z79899 Other long term (current) drug therapy: Secondary | ICD-10-CM | POA: Insufficient documentation

## 2023-09-08 DIAGNOSIS — W19XXXA Unspecified fall, initial encounter: Secondary | ICD-10-CM

## 2023-09-08 DIAGNOSIS — Y92512 Supermarket, store or market as the place of occurrence of the external cause: Secondary | ICD-10-CM | POA: Insufficient documentation

## 2023-09-08 MED ORDER — METHOCARBAMOL 500 MG PO TABS: 500.0000 mg | ORAL_TABLET | Freq: Two times a day (BID) | ORAL | 0 refills | Status: DC

## 2023-09-08 MED ORDER — HYDROCODONE-ACETAMINOPHEN 5-325 MG PO TABS
1.0000 | ORAL_TABLET | Freq: Once | ORAL | Status: AC
  Administered 2023-09-08: 1 via ORAL
  Filled 2023-09-08: qty 1

## 2023-09-08 MED ORDER — NAPROXEN 500 MG PO TABS: 500.0000 mg | ORAL_TABLET | Freq: Two times a day (BID) | ORAL | 0 refills | Status: DC

## 2023-09-08 MED ORDER — LIDOCAINE 5 % EX PTCH: 1.0000 | MEDICATED_PATCH | CUTANEOUS | 0 refills | Status: DC

## 2023-09-08 NOTE — Discharge Instructions (Signed)
 Please take naproxen  twice daily with food.  You can take 1000 mg of Tylenol  every 8 hours as well. Take Robaxin  as needed for muscle spasm but do not drive or operate heavy machinery with this.  Use lidocaine  patch over area of pain, or ice or heat. Follow-up with primary care.  Return to ER with new or worsening symptoms
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
## 2023-11-08 ENCOUNTER — Other Ambulatory Visit: Payer: Self-pay | Admitting: Nurse Practitioner

## 2023-11-08 DIAGNOSIS — E785 Hyperlipidemia, unspecified: Secondary | ICD-10-CM

## 2023-11-08 DIAGNOSIS — E119 Type 2 diabetes mellitus without complications: Secondary | ICD-10-CM

## 2023-11-08 DIAGNOSIS — I10 Essential (primary) hypertension: Secondary | ICD-10-CM

## 2023-11-09 ENCOUNTER — Other Ambulatory Visit: Payer: Self-pay | Admitting: Nurse Practitioner

## 2023-11-09 DIAGNOSIS — I10 Essential (primary) hypertension: Secondary | ICD-10-CM

## 2023-11-09 DIAGNOSIS — E119 Type 2 diabetes mellitus without complications: Secondary | ICD-10-CM

## 2023-11-09 DIAGNOSIS — E785 Hyperlipidemia, unspecified: Secondary | ICD-10-CM

## 2023-11-09 NOTE — Telephone Encounter (Unsigned)
 Copied from CRM [id number]. Topic: Clinical - Medication Refill >> Nov 09, 2023  4:34 PM Zebedee SAUNDERS wrote: Medication: metFORMIN  (GLUCOPHAGE ) 1000 MG tablet,losartan  (COZAAR ) 50 MG tablet,ezetimibe  (ZETIA ) 10 MG tablet,   Has the patient contacted their pharmacy? Yes (Agent: If no, request that the patient contact the pharmacy for the refill. If patient does not wish to contact the pharmacy document the reason why and proceed with request.) (Agent: If yes, when and what did the pharmacy advise?)  This is the patient's preferred pharmacy:  [facility] [address], [place] - [address] [place] [address] Phone: [phone] Fax: [phone] Is this the correct pharmacy for this prescription? Yes If no, delete pharmacy and type the correct one.   Has the prescription been filled recently? Yes  Is the patient out of the medication? Yes  Has the patient been seen for an appointment in the last year OR does the patient have an upcoming appointment? Yes  Can we respond through MyChart? Yes  Agent: Please be advised that Rx refills may take up to 3 business days. We ask that you follow-up with your pharmacy.

## 2023-11-12 ENCOUNTER — Other Ambulatory Visit: Payer: Self-pay | Admitting: Nurse Practitioner

## 2023-11-12 DIAGNOSIS — E119 Type 2 diabetes mellitus without complications: Secondary | ICD-10-CM

## 2023-12-13 ENCOUNTER — Other Ambulatory Visit: Payer: Self-pay | Admitting: Nurse Practitioner

## 2023-12-13 DIAGNOSIS — N529 Male erectile dysfunction, unspecified: Secondary | ICD-10-CM

## 2023-12-13 NOTE — Telephone Encounter (Signed)
 Copied from CRM [id number]. Topic: Clinical - Medication Refill >> Dec 13, 2023  4:19 PM Thersia C wrote: Medication: tadalafil  (CIALIS ) 5 MG tablet - Patient is scheduled for appointment 01/29 and this was the soonest wanted to know if he can get it refilled while he is waiting on his appointment   Has the patient contacted their pharmacy? Yes (Agent: If no, request that the patient contact the pharmacy for the refill. If patient does not wish to contact the pharmacy document the reason why and proceed with request.) (Agent: If yes, when and what did the pharmacy advise?)  This is the patient's preferred pharmacy:  [facility] [address], [place] - [address] [place] [address] Phone: [phone] Fax: [phone]  Is this the correct pharmacy for this prescription? Yes If no, delete pharmacy and type the correct one.   Has the prescription been filled recently? No  Is the patient out of the medication? Yes  Has the patient been seen for an appointment in the last year OR does the patient have an upcoming appointment? Yes  Can we respond through MyChart? Yes  Agent: Please be advised that Rx refills may take up to 3 business days. We ask that you follow-up with your pharmacy.

## 2023-12-15 ENCOUNTER — Other Ambulatory Visit: Payer: Self-pay | Admitting: Nurse Practitioner

## 2023-12-15 DIAGNOSIS — N529 Male erectile dysfunction, unspecified: Secondary | ICD-10-CM

## 2023-12-18 ENCOUNTER — Other Ambulatory Visit: Payer: Self-pay | Admitting: Nurse Practitioner

## 2023-12-18 DIAGNOSIS — E119 Type 2 diabetes mellitus without complications: Secondary | ICD-10-CM

## 2023-12-18 DIAGNOSIS — I10 Essential (primary) hypertension: Secondary | ICD-10-CM

## 2023-12-18 DIAGNOSIS — N529 Male erectile dysfunction, unspecified: Secondary | ICD-10-CM

## 2024-01-30 ENCOUNTER — Other Ambulatory Visit: Payer: Self-pay | Admitting: Nurse Practitioner

## 2024-01-30 DIAGNOSIS — E119 Type 2 diabetes mellitus without complications: Secondary | ICD-10-CM

## 2024-01-30 DIAGNOSIS — E785 Hyperlipidemia, unspecified: Secondary | ICD-10-CM

## 2024-02-15 ENCOUNTER — Ambulatory Visit: Payer: Self-pay | Admitting: Nurse Practitioner

## 2024-02-15 VITALS — BP 136/80 | HR 72 | Temp 97.8°F | Ht 69.0 in | Wt 336.1 lb

## 2024-02-15 DIAGNOSIS — Z0001 Encounter for general adult medical examination with abnormal findings: Secondary | ICD-10-CM | POA: Insufficient documentation

## 2024-02-15 DIAGNOSIS — E785 Hyperlipidemia, unspecified: Secondary | ICD-10-CM

## 2024-02-15 DIAGNOSIS — F39 Unspecified mood [affective] disorder: Secondary | ICD-10-CM | POA: Diagnosis not present

## 2024-02-15 DIAGNOSIS — Z0000 Encounter for general adult medical examination without abnormal findings: Secondary | ICD-10-CM

## 2024-02-15 DIAGNOSIS — E1165 Type 2 diabetes mellitus with hyperglycemia: Secondary | ICD-10-CM

## 2024-02-15 DIAGNOSIS — I10 Essential (primary) hypertension: Secondary | ICD-10-CM

## 2024-02-15 DIAGNOSIS — K439 Ventral hernia without obstruction or gangrene: Secondary | ICD-10-CM

## 2024-02-15 DIAGNOSIS — E559 Vitamin D deficiency, unspecified: Secondary | ICD-10-CM | POA: Diagnosis not present

## 2024-02-15 LAB — LIPID PANEL
Cholesterol: 187 mg/dL (ref 28–200)
HDL: 35.2 mg/dL — ABNORMAL LOW
LDL Cholesterol: 127 mg/dL — ABNORMAL HIGH (ref 10–99)
NonHDL: 152.05
Total CHOL/HDL Ratio: 5
Triglycerides: 126 mg/dL (ref 10.0–149.0)
VLDL: 25.2 mg/dL (ref 0.0–40.0)

## 2024-02-15 LAB — URINALYSIS, ROUTINE W REFLEX MICROSCOPIC
Bilirubin Urine: NEGATIVE
Hgb urine dipstick: NEGATIVE
Ketones, ur: NEGATIVE
Leukocytes,Ua: NEGATIVE
Nitrite: NEGATIVE
Specific Gravity, Urine: 1.015 (ref 1.000–1.030)
Total Protein, Urine: NEGATIVE
Urine Glucose: >=1000 — AB
Urobilinogen, UA: 1 (ref 0.0–1.0)
pH: 5.5 (ref 5.0–8.0)

## 2024-02-15 LAB — MICROALBUMIN / CREATININE URINE RATIO
Creatinine,U: 69.5 mg/dL
Microalb Creat Ratio: 19.1 mg/g (ref 0.0–30.0)
Microalb, Ur: 1.3 mg/dL (ref 0.7–1.9)

## 2024-02-15 LAB — CBC
HCT: 46.2 % (ref 39.0–52.0)
Hemoglobin: 16 g/dL (ref 13.0–17.0)
MCHC: 34.6 g/dL (ref 30.0–36.0)
MCV: 90.7 fl (ref 78.0–100.0)
Platelets: 207 10*3/uL (ref 150.0–400.0)
RBC: 5.09 Mil/uL (ref 4.22–5.81)
RDW: 12.6 % (ref 11.5–15.5)
WBC: 8 10*3/uL (ref 4.0–10.5)

## 2024-02-15 LAB — COMPREHENSIVE METABOLIC PANEL WITH GFR
ALT: 17 U/L (ref 3–53)
AST: 18 U/L (ref 5–37)
Albumin: 4.3 g/dL (ref 3.5–5.2)
Alkaline Phosphatase: 58 U/L (ref 39–117)
BUN: 16 mg/dL (ref 6–23)
CO2: 28 meq/L (ref 19–32)
Calcium: 9.4 mg/dL (ref 8.4–10.5)
Chloride: 100 meq/L (ref 96–112)
Creatinine, Ser: 0.94 mg/dL (ref 0.40–1.50)
GFR: 89.79 mL/min
Glucose, Bld: 178 mg/dL — ABNORMAL HIGH (ref 70–99)
Potassium: 4.4 meq/L (ref 3.5–5.1)
Sodium: 136 meq/L (ref 135–145)
Total Bilirubin: 1.6 mg/dL — ABNORMAL HIGH (ref 0.2–1.2)
Total Protein: 7.5 g/dL (ref 6.0–8.3)

## 2024-02-15 LAB — VITAMIN D 25 HYDROXY (VIT D DEFICIENCY, FRACTURES): VITD: 30.73 ng/mL (ref 30.00–100.00)

## 2024-02-15 LAB — HEMOGLOBIN A1C: Hgb A1c MFr Bld: 8.8 % — ABNORMAL HIGH (ref 4.6–6.5)

## 2024-02-15 LAB — URIC ACID: Uric Acid, Serum: 8.2 mg/dL — ABNORMAL HIGH (ref 4.0–7.8)

## 2024-02-15 LAB — TSH: TSH: 1.73 u[IU]/mL (ref 0.35–5.50)

## 2024-02-15 NOTE — Assessment & Plan Note (Addendum)
 Chronic, not at goal due to patient having insurance issues, has restarted Farxiga  a 1 week ago after being off a year.  -Encouraged to continue to take Farxiga  and Metformin   -Will recheck A1C,CBC, CMP -Will check UACR and UA - Will place a referral to Pharmacy consultant -Encouraged member to check blood sugar at least twice a day  -Patient to follow-up in 3 months but sooner if labs are abnormal

## 2024-02-15 NOTE — Assessment & Plan Note (Signed)
 Chronic Stable Discussed treatment options. Pt declined GI referral or imaging at this time. Pt advised if  he should have severe abdominal pain, vomiting or if no bowel movement to go to the ED for further eval.

## 2024-02-15 NOTE — Assessment & Plan Note (Signed)
 Chronic Will recheck labs today

## 2024-02-15 NOTE — Progress Notes (Signed)
 "  Complete physical exam  Patient: Nathan Velez   DOB: September 02, 1965   59 y.o. Male  MRN: [id number]  Subjective:    Chief Complaint  Patient presents with   Annual Exam    Nathan Velez is a 59 y.o. male who presents today for a complete physical exam. He reports consuming a general diet. The patient does not participate in regular exercise at present. He generally feels fairly well. He reports sleeping fairly well. He does not have additional problems to discuss today.    Most recent fall risk assessment:    10/20/2022    3:23 PM  Fall Risk   Falls in the past year? 0  Number falls in past yr: 0  Injury with Fall? 0   Risk for fall due to : No Fall Risks  Follow up Falls evaluation completed     Data saved with a previous flowsheet row definition     Most recent depression screenings:    10/20/2022    3:23 PM 05/19/2022    2:51 PM  PHQ 2/9 Scores  PHQ - 2 Score 0 0    Vision:, Dental: , and PSA:     Patient Care Team: Elnor Lauraine BRAVO, NP as PCP - General (Nurse Practitioner) Alvan Dorn FALCON, MD as PCP - Cardiology (Cardiology)   Show/hide medication list[1]  Review of Systems  Constitutional:  Negative for chills, fever, malaise/fatigue and weight loss.  HENT:  Negative for congestion, ear discharge, ear pain, hearing loss, nosebleeds, sinus pain, sore throat and tinnitus.   Eyes:  Negative for blurred vision, double vision, photophobia, pain, discharge and redness.  Respiratory:  Negative for cough, hemoptysis, sputum production, shortness of breath and wheezing.   Cardiovascular:  Positive for leg swelling. Negative for chest pain, palpitations, claudication and PND.  Gastrointestinal:  Negative for abdominal pain, blood in stool, constipation, diarrhea, heartburn, melena, nausea and vomiting.  Genitourinary:  Positive for flank pain (reports occassional flank pain). Negative for dysuria, frequency, hematuria and urgency.  Musculoskeletal:  Positive for  falls (Fall in August 2025) and joint pain (bilateral knees). Negative for back pain, myalgias and neck pain.  Skin:  Negative for itching and rash.  Neurological:  Negative for dizziness, tingling, tremors, sensory change, focal weakness, seizures, loss of consciousness, weakness and headaches.  Endo/Heme/Allergies:  Negative for polydipsia. Does not bruise/bleed easily.  Psychiatric/Behavioral:  Negative for depression, hallucinations, memory loss, substance abuse and suicidal ideas. The patient is not nervous/anxious and does not have insomnia.           Objective:     BP 136/80   Pulse 72   Temp 97.8 F (36.6 C) (Temporal)   Ht 5' 9 (1.753 m)   Wt (!) 336 lb 2 oz (152.5 kg)   SpO2 97%   BMI 49.64 kg/m    Physical Exam Constitutional:      Appearance: Normal appearance.  HENT:     Head: Normocephalic and atraumatic.     Right Ear: Tympanic membrane normal.     Left Ear: Tympanic membrane normal.     Nose: Nose normal.     Mouth/Throat:     Mouth: Mucous membranes are moist.  Cardiovascular:     Rate and Rhythm: Normal rate and regular rhythm.  Pulmonary:     Effort: Pulmonary effort is normal. No respiratory distress.     Breath sounds: Normal breath sounds. No wheezing or rhonchi.  Abdominal:     General: Bowel sounds  are normal. There is distension.     Palpations: There is no mass.     Tenderness: There is no abdominal tenderness. There is no right CVA tenderness, left CVA tenderness, guarding or rebound.     Hernia: A hernia (Abdominal) is present.  Musculoskeletal:        General: No tenderness, deformity or signs of injury.     Cervical back: Normal range of motion and neck supple. No tenderness.     Right lower leg: Edema (trace) present.     Left lower leg: Edema (trace) present.  Lymphadenopathy:     Cervical: No cervical adenopathy.  Skin:    General: Skin is warm and dry.  Neurological:     Mental Status: He is alert and oriented to person, place,  and time. Mental status is at baseline.     Motor: No weakness.     Coordination: Coordination normal.     Gait: Gait normal.     Deep Tendon Reflexes: Reflexes normal.  Psychiatric:        Mood and Affect: Mood normal.        Behavior: Behavior normal.        Thought Content: Thought content normal.        Judgment: Judgment normal.      No results found for any visits on 02/15/24.     Assessment & Plan:    Routine Health Maintenance and Physical Exam  Immunization History  Administered Date(s) Administered   Influenza Inj Mdck Quad Pf 11/19/2019   Pneumococcal Polysaccharide-23 08/12/2016   Td 01/17/2017    Health Maintenance  Topic Date Due   Hepatitis B Vaccines 19-59 Average Risk (1 of 3 - 19+ 3-dose series) Never done   Zoster Vaccines- Shingrix (1 of 2) Never done   Pneumococcal Vaccine: 50+ Years (2 of 2 - PCV) 08/12/2017   Diabetic kidney evaluation - Urine ACR  11/18/2020   OPHTHALMOLOGY EXAM  06/02/2021   HEMOGLOBIN A1C  04/20/2023   Fecal DNA (Cologuard)  04/23/2023   Influenza Vaccine  08/18/2023   COVID-19 Vaccine (1 - 2025-26 season) Never done   Diabetic kidney evaluation - eGFR measurement  10/20/2023   FOOT EXAM  02/14/2025   DTaP/Tdap/Td (2 - Tdap) 01/18/2027   HPV VACCINES (No Doses Required) Completed   Hepatitis C Screening  Completed   HIV Screening  Completed   Meningococcal B Vaccine  Aged Out    Discussed health benefits of physical activity, and encouraged him to engage in regular exercise appropriate for his age and condition.  Problem List Items Addressed This Visit       Cardiovascular and Mediastinum   Essential hypertension   Chronic, Stable, controlled -Continue Losartan , Patient reports that he is not taking Lasix  due to affordable -Will refer pt to pharmacy consultant -Patient to follow-up in 3 months but sooner if labs are abnormal       Relevant Orders   AMB Referral VBCI Care Management   CBC   Comprehensive  metabolic panel with GFR   Hemoglobin A1c   Lipid panel   TSH   Uric acid   Microalbumin / creatinine urine ratio   Cologuard     Endocrine   DM type 2 (diabetes mellitus, type 2) (HCC)   Chronic, not at goal due to patient having insurance issues, has restarted Farxiga  a 1 week ago after being off a year.  -Encouraged to continue to take Farxiga  and Metformin   -Will recheck A1C,CBC, CMP -  Will check UACR and UA - Will place a referral to Pharmacy Consultant -Encouraged member to check blood sugar at least twice a day  -Patient to follow-up in 3 months but sooner if labs are abnormal      Relevant Orders   AMB Referral VBCI Care Management   CBC   Comprehensive metabolic panel with GFR   Hemoglobin A1c   Lipid panel   TSH   Uric acid   Microalbumin / creatinine urine ratio   Cologuard   Ambulatory referral to Ophthalmology   Urinalysis, Routine w reflex microscopic     Other   Morbid obesity (HCC)   Relevant Orders   AMB Referral VBCI Care Management   CBC   Comprehensive metabolic panel with GFR   Hemoglobin A1c   Lipid panel   TSH   Uric acid   Microalbumin / creatinine urine ratio   Cologuard   Mood disorder   Chronic, Stable      Hyperlipidemia   Chronic, -Has been off Zetia  due to insurance issues.  -Will check Lipid panel today -Referral made pharmacy consultant Patient to follow-up in 3 months but sooner if labs are abnormal       Relevant Orders   AMB Referral VBCI Care Management   CBC   Comprehensive metabolic panel with GFR   Hemoglobin A1c   Lipid panel   TSH   Uric acid   Microalbumin / creatinine urine ratio   Cologuard   Vitamin D  deficiency   Relevant Orders   AMB Referral VBCI Care Management   CBC   Comprehensive metabolic panel with GFR   Hemoglobin A1c   Lipid panel   TSH   Uric acid   Microalbumin / creatinine urine ratio   Cologuard   Vitamin D  (25 hydroxy)   Hernia of abdominal wall   Chronic Stable Discussed  treatment options. Pt declined GI referral or imaging at this time. Pt advised if  he should have severe abdominal pain, vomiting or if no bowel movement to go to the ED for further eval.       Encounter for general adult medical examination with abnormal findings - Primary   Return in about 3 months (around 05/15/2024) for F/U with Nathan Velez.     Tinnie LITTIE Limes, RN      [1]  Outpatient Medications Prior to Visit  Medication Sig   Acetaminophen  (TYLENOL ) 325 MG CAPS Take 650 mg by mouth every 6 (six) hours as needed.   ezetimibe  (ZETIA ) 10 MG tablet Take 1 tablet by mouth once daily   FARXIGA  10 MG TABS tablet Take 1 tablet by mouth once daily   furosemide  (LASIX ) 40 MG tablet Take 1 tablet (40 mg total) by mouth daily as needed for edema.   losartan  (COZAAR ) 50 MG tablet TAKE 1 TABLET BY MOUTH ONCE DAILY . APPOINTMENT REQUIRED FOR FUTURE REFILLS   metFORMIN  (GLUCOPHAGE ) 1000 MG tablet TAKE 1 TABLET BY MOUTH TWICE DAILY . APPOINTMENT REQUIRED FOR FUTURE REFILLS   Multiple Vitamins-Minerals (EMERGEN-C IMMUNE) PACK Take 1 packet by mouth daily as needed.   OVER THE COUNTER MEDICATION Take 500 mg by mouth daily. Potassium OTC supplement   [DISCONTINUED] allopurinol  (ZYLOPRIM ) 300 MG tablet Take 300 mg by mouth daily. Only taking after acute attacks   [DISCONTINUED] colchicine  0.6 MG tablet Take for acute gout flare. Take 2 tablets by mouth once. Take 1 additional tablet by mouth in 1 hour if pain persists.   [DISCONTINUED] hydrOXYzine  (ATARAX ) 25  MG tablet Take 1 tablet (25 mg total) by mouth every 8 (eight) hours as needed for anxiety.   [DISCONTINUED] lidocaine  (LIDODERM ) 5 % Place 1 patch onto the skin daily. Remove & Discard patch within 12 hours or as directed by MD   [DISCONTINUED] methocarbamol  (ROBAXIN ) 500 MG tablet Take 1 tablet (500 mg total) by mouth 2 (two) times daily.   [DISCONTINUED] naproxen  (NAPROSYN ) 500 MG tablet Take 1 tablet (500 mg total) by mouth 2 (two) times daily.    [DISCONTINUED] Omega-3 Fatty Acids (FISH OIL) 1000 MG CAPS Take 2 capsules by mouth 2 (two) times daily.   [DISCONTINUED] sulfamethoxazole -trimethoprim  (BACTRIM  DS) 800-160 MG tablet Take 1 tablet by mouth 2 (two) times daily.   [DISCONTINUED] tadalafil  (CIALIS ) 5 MG tablet TAKE 1 TABLET BY MOUTH ONCE DAILY AS NEEDED FOR ERECTILE DYSFUNCTION   [DISCONTINUED] Vitamin D -Vitamin K (K2 PLUS D3 PO) Take by mouth. 10,000IUS of D3 and 90 of K2 once per day   [DISCONTINUED] vitamin E 600 UNIT capsule Take 1,000 Units by mouth in the morning and at bedtime.   No facility-administered medications prior to visit.   "

## 2024-02-15 NOTE — Assessment & Plan Note (Signed)
 Chronic, Stable

## 2024-02-15 NOTE — Assessment & Plan Note (Addendum)
 Chronic, not at goal due to patient having insurance issues, has restarted Farxiga  a 1 week ago after being off a year.  -Encouraged to continue to take Farxiga  and Metformin   -Will recheck A1C,CBC, CMP -Will check UACR and UA - Will place a referral to Pharmacy Consultant -Encouraged member to check blood sugar at least twice a day  -Patient to follow-up in 3 months but sooner if labs are abnormal

## 2024-02-15 NOTE — Assessment & Plan Note (Addendum)
 Chronic, -Has been off Zetia  due to insurance issues.  -Will check Lipid panel today -Referral made pharmacy consultant Patient to follow-up in 3 months but sooner if labs are abnormal

## 2024-02-15 NOTE — Assessment & Plan Note (Signed)
-  Due for Fecal  DNA (Cologuard) accepted and ordered -Due for Pneumococcal, Influenza,Hepatitis B, Covid-19 vaccines offered, Risk/benefit discussed and pt. declined.  -Due for Ophthalmology exam, Referral made

## 2024-02-15 NOTE — Assessment & Plan Note (Addendum)
 Chronic, Stable, controlled -Continue Losartan , Patient reports that he is not taking Lasix  due to affordable -Will refer pt to pharmacy consultant -Patient to follow-up in 3 months but sooner if labs are abnormal

## 2024-02-16 ENCOUNTER — Ambulatory Visit: Payer: Self-pay | Admitting: Nurse Practitioner

## 2024-02-16 ENCOUNTER — Telehealth: Payer: Self-pay

## 2024-02-16 NOTE — Progress Notes (Signed)
 Care Guide Pharmacy Note  02/16/2024 Name: Nathan Velez MRN: [id number] DOB: November 02, 1965  Referred By: Elnor Lauraine BRAVO, NP Reason for referral: Call Attempt #1 and Complex Care Management (Outreach to sch ref w/ pharm)   Nathan Velez is a 59 y.o. year old male who is a primary care patient of Elnor Lauraine BRAVO, NP.  Nathan Velez was referred to the pharmacist for assistance related to: HTN, HLD, and DMII  Successful contact was made with the patient to discuss pharmacy services including being ready for the pharmacist to call at least 5 minutes before the scheduled appointment time and to have medication bottles and any blood pressure readings ready for review. The patient agreed to meet with the pharmacist via telephone visit on 02/28/2024.  Nathan Velez  [facility], [facility] Guide Direct Dial: [phone]  Fax: [phone]

## 2024-02-28 ENCOUNTER — Other Ambulatory Visit

## 2024-05-16 ENCOUNTER — Ambulatory Visit: Admitting: Nurse Practitioner
